# Patient Record
Sex: Female | Born: 1935 | Race: White | Hispanic: No | Smoking: Former smoker
Health system: Southern US, Community
[De-identification: ages and names within clinical notes are randomized; demographics above are authoritative.]

## PROBLEM LIST (undated history)

## (undated) DIAGNOSIS — I10 Essential (primary) hypertension: Secondary | ICD-10-CM

## (undated) DIAGNOSIS — I350 Nonrheumatic aortic (valve) stenosis: Principal | ICD-10-CM

## (undated) DIAGNOSIS — I639 Cerebral infarction, unspecified: Secondary | ICD-10-CM

## (undated) DIAGNOSIS — E785 Hyperlipidemia, unspecified: Secondary | ICD-10-CM

## (undated) DIAGNOSIS — I429 Cardiomyopathy, unspecified: Secondary | ICD-10-CM

## (undated) DIAGNOSIS — C9110 Chronic lymphocytic leukemia of B-cell type not having achieved remission: Principal | ICD-10-CM

## (undated) DIAGNOSIS — D649 Anemia, unspecified: Secondary | ICD-10-CM

## (undated) DIAGNOSIS — T7840XA Allergy, unspecified, initial encounter: Secondary | ICD-10-CM

## (undated) DIAGNOSIS — Z5189 Encounter for other specified aftercare: Secondary | ICD-10-CM

## (undated) DIAGNOSIS — E611 Iron deficiency: Secondary | ICD-10-CM

## (undated) DIAGNOSIS — M8580 Other specified disorders of bone density and structure, unspecified site: Secondary | ICD-10-CM

## (undated) DIAGNOSIS — L853 Xerosis cutis: Secondary | ICD-10-CM

## (undated) DIAGNOSIS — D72820 Lymphocytosis (symptomatic): Principal | ICD-10-CM

## (undated) DIAGNOSIS — C4490 Unspecified malignant neoplasm of skin, unspecified: Secondary | ICD-10-CM

## (undated) HISTORY — DX: Anemia, unspecified: D64.9

## (undated) HISTORY — DX: Other specified disorders of bone density and structure, unspecified site: M85.80

## (undated) HISTORY — PX: CATARACT EXTRACTION: SUR2

## (undated) HISTORY — DX: Cerebral infarction, unspecified: I63.9

## (undated) HISTORY — DX: Unspecified malignant neoplasm of skin, unspecified: C44.90

## (undated) HISTORY — DX: Iron deficiency: E61.1

## (undated) HISTORY — DX: Lymphocytosis (symptomatic): D72.820

## (undated) HISTORY — DX: Chronic lymphocytic leukemia of B-cell type not having achieved remission: C91.10

## (undated) HISTORY — PX: APPENDECTOMY: SHX54

## (undated) HISTORY — DX: Encounter for other specified aftercare: Z51.89

## (undated) HISTORY — DX: Nonrheumatic aortic (valve) stenosis: I35.0

## (undated) HISTORY — DX: Allergy, unspecified, initial encounter: T78.40XA

## (undated) HISTORY — DX: Xerosis cutis: L85.3

## (undated) HISTORY — DX: Essential (primary) hypertension: I10

## (undated) HISTORY — DX: Hyperlipidemia, unspecified: E78.5

---

## 1974-01-22 HISTORY — PX: BREAST BIOPSY: SHX20

## 2010-11-24 ENCOUNTER — Encounter: Payer: Self-pay | Admitting: Internal Medicine

## 2010-11-24 DIAGNOSIS — Z0000 Encounter for general adult medical examination without abnormal findings: Secondary | ICD-10-CM | POA: Insufficient documentation

## 2010-11-24 DIAGNOSIS — Z0001 Encounter for general adult medical examination with abnormal findings: Secondary | ICD-10-CM | POA: Insufficient documentation

## 2010-12-01 ENCOUNTER — Encounter: Payer: Self-pay | Admitting: Internal Medicine

## 2010-12-01 ENCOUNTER — Other Ambulatory Visit (INDEPENDENT_AMBULATORY_CARE_PROVIDER_SITE_OTHER): Payer: Medicare Other

## 2010-12-01 ENCOUNTER — Ambulatory Visit (INDEPENDENT_AMBULATORY_CARE_PROVIDER_SITE_OTHER): Payer: Medicare Other | Admitting: Internal Medicine

## 2010-12-01 VITALS — BP 140/70 | HR 66 | Temp 97.3°F | Ht 60.0 in | Wt 146.5 lb

## 2010-12-01 DIAGNOSIS — IMO0001 Reserved for inherently not codable concepts without codable children: Secondary | ICD-10-CM

## 2010-12-01 DIAGNOSIS — R5381 Other malaise: Secondary | ICD-10-CM

## 2010-12-01 DIAGNOSIS — M8580 Other specified disorders of bone density and structure, unspecified site: Secondary | ICD-10-CM | POA: Insufficient documentation

## 2010-12-01 DIAGNOSIS — R5383 Other fatigue: Secondary | ICD-10-CM

## 2010-12-01 DIAGNOSIS — K635 Polyp of colon: Secondary | ICD-10-CM | POA: Insufficient documentation

## 2010-12-01 DIAGNOSIS — T7840XA Allergy, unspecified, initial encounter: Secondary | ICD-10-CM | POA: Insufficient documentation

## 2010-12-01 DIAGNOSIS — C9110 Chronic lymphocytic leukemia of B-cell type not having achieved remission: Secondary | ICD-10-CM

## 2010-12-01 DIAGNOSIS — L258 Unspecified contact dermatitis due to other agents: Secondary | ICD-10-CM

## 2010-12-01 DIAGNOSIS — R42 Dizziness and giddiness: Secondary | ICD-10-CM

## 2010-12-01 DIAGNOSIS — I10 Essential (primary) hypertension: Secondary | ICD-10-CM | POA: Insufficient documentation

## 2010-12-01 DIAGNOSIS — E611 Iron deficiency: Secondary | ICD-10-CM

## 2010-12-01 DIAGNOSIS — E785 Hyperlipidemia, unspecified: Secondary | ICD-10-CM

## 2010-12-01 DIAGNOSIS — E119 Type 2 diabetes mellitus without complications: Secondary | ICD-10-CM | POA: Insufficient documentation

## 2010-12-01 DIAGNOSIS — D649 Anemia, unspecified: Secondary | ICD-10-CM | POA: Insufficient documentation

## 2010-12-01 DIAGNOSIS — L853 Xerosis cutis: Secondary | ICD-10-CM

## 2010-12-01 HISTORY — DX: Xerosis cutis: L85.3

## 2010-12-01 HISTORY — DX: Chronic lymphocytic leukemia of B-cell type not having achieved remission: C91.10

## 2010-12-01 HISTORY — DX: Iron deficiency: E61.1

## 2010-12-01 LAB — CBC WITH DIFFERENTIAL/PLATELET
Basophils Absolute: 0 10*3/uL (ref 0.0–0.1)
Hemoglobin: 11.7 g/dL — ABNORMAL LOW (ref 12.0–15.0)
Lymphocytes Relative: 56.2 % — ABNORMAL HIGH (ref 12.0–46.0)
Monocytes Relative: 4.5 % (ref 3.0–12.0)
Neutro Abs: 3.2 10*3/uL (ref 1.4–7.7)
Platelets: 168 10*3/uL (ref 150.0–400.0)
RDW: 14.8 % — ABNORMAL HIGH (ref 11.5–14.6)
WBC: 8.5 10*3/uL (ref 4.5–10.5)

## 2010-12-01 LAB — URINALYSIS, ROUTINE W REFLEX MICROSCOPIC
Bilirubin Urine: NEGATIVE
Hgb urine dipstick: NEGATIVE
Leukocytes, UA: NEGATIVE
Nitrite: NEGATIVE
Urobilinogen, UA: 0.2 (ref 0.0–1.0)

## 2010-12-01 LAB — LIPID PANEL
HDL: 66.4 mg/dL (ref >39.00)
LDL Cholesterol: 96 mg/dL (ref 0–99)
Total CHOL/HDL Ratio: 3
Triglycerides: 57 mg/dL (ref 0.0–149.0)
VLDL: 11.4 mg/dL (ref 0.0–40.0)

## 2010-12-01 LAB — BASIC METABOLIC PANEL
CO2: 27 mEq/L (ref 19–32)
Calcium: 9.5 mg/dL (ref 8.4–10.5)
Creatinine, Ser: 1 mg/dL (ref 0.4–1.2)
GFR: 56.76 mL/min — ABNORMAL LOW (ref >60.00)

## 2010-12-01 LAB — HEPATIC FUNCTION PANEL
Albumin: 4 g/dL (ref 3.5–5.2)
Alkaline Phosphatase: 40 U/L (ref 39–117)
Total Protein: 7 g/dL (ref 6.0–8.3)

## 2010-12-01 LAB — TSH: TSH: 1.73 u[IU]/mL (ref 0.35–5.50)

## 2010-12-01 MED ORDER — HYDROCHLOROTHIAZIDE 25 MG PO TABS: 25.0000 mg | ORAL_TABLET | Freq: Every day | ORAL | Status: DC

## 2010-12-01 MED ORDER — METFORMIN HCL 1000 MG PO TABS: 1000.0000 mg | ORAL_TABLET | Freq: Every day | ORAL | Status: DC

## 2010-12-01 MED ORDER — ATORVASTATIN CALCIUM 20 MG PO TABS: 20.0000 mg | ORAL_TABLET | Freq: Every day | ORAL | Status: DC

## 2010-12-01 MED ORDER — LOSARTAN POTASSIUM 100 MG PO TABS: 100.0000 mg | ORAL_TABLET | Freq: Every day | ORAL | Status: DC

## 2010-12-01 MED ORDER — AMLODIPINE BESYLATE 5 MG PO TABS: 5.0000 mg | ORAL_TABLET | Freq: Every day | ORAL | Status: DC

## 2010-12-01 MED ORDER — PIOGLITAZONE HCL 30 MG PO TABS: 30.0000 mg | ORAL_TABLET | Freq: Every day | ORAL | Status: DC

## 2010-12-01 MED ORDER — DIAZEPAM 5 MG PO TABS: ORAL_TABLET | ORAL | Status: DC

## 2010-12-01 NOTE — Patient Instructions (Addendum)
You will be contacted regarding the referral for: oncology, dermatology Please go to LAB in the Basement for the blood and/or urine tests to be done today Please call the phone number [phone] (the [place]) for results of testing in 2-3 days;  When calling, simply dial the number, and when prompted enter the MRN number above (the Medical Record Number) and the # key, then the message should start. OK to stop the pravachol Please start the lipitor 20 mg per day Continue all other medications as before Your refills were sent to the pharmacy on the computer Please take prilosec 20 mg OTC to see if this helps the nause in the future Please call if you change your mind about seeing GI, as we can refer at any time Please try to "wean" off the valium by cutting back on the valium at night first for about 1 wk, then not taking the pill in the AM after that If the dizziness is persistent or worsening, we should consider referral to [facility] neurology as well Yoor "DNR" will be designated on your medical record Please return in 6 months, or sooner if needed

## 2010-12-01 NOTE — Progress Notes (Signed)
Subjective:    Patient ID: Norma Smith, female    DOB: 06-Mar-1933, 77 y.o.   MRN: [id number]  HPI Here to establish as new pt;  C/o recurrent dizziness with nausea and could not tolerate the meclizine due to sleepiness, better overall with valium and feels relatively well controlled, but nausea seems to persist and now thinks may not be as related as she though previously;  Last episode vomiting approx 1 mo ago, with some BRB, as well as dark material at about the same time, assoc with upper abd discomfort  No dysphagia, other abd pain, radiation, fever, bowel change or other blood.  Pt denies chest pain, increased sob or doe, wheezing, orthopnea, PND, increased LE swelling, palpitations, dizziness or syncope.  Pt denies new neurological symptoms such as new headache, or facial or extremity weakness or numbness   Pt denies polydipsia, polyuria, or low sugar symptoms such as weakness or confusion improved with po intake.  Pt states overall good compliance with meds, trying to follow lower cholesterol, diabetic diet, wt overall stable but little exercise however.    Pravastatin may tend to cause abd pain, may be less with taking only one of the 40 mg pill  - has taken lipitor for many yrs in the past and toerated well.  Pt denies fever, wt loss, night sweats, loss of appetite, or other constitutional symptoms  After d/w pt today, she also requests DNR status as this was the case prior to moving here as well, and understands the implications of no resuscitation efforts for her in the future to include intubation, defib, compressions and epi.  Does have sense of ongoing fatigue, but denies signficant hypersomnolence.  Past Medical History  Diagnosis Date  . Cancer   . Colon polyps   . Iron deficiency 12/01/2010  . Allergy   . Diabetes mellitus   . Hyperlipidemia   . Hypertension   . Anemia   . Osteopenia   . Dry skin dermatitis 12/01/2010    CLL Past Surgical History  Procedure Date  . Breast biopsy  1976  . Cataract extraction     reports that she has been smoking.  She does not have any smokeless tobacco history on file. She reports that she does not drink alcohol or use illicit drugs. family history includes Cancer in her other; Diabetes in her others; and Heart disease in her other. Allergies  Allergen Reactions  . Codeine   . Penicillins    No current outpatient prescriptions on file prior to visit.   Review of Systems Review of Systems  Constitutional: Negative for diaphoresis and unexpected weight change.  HENT: Negative for drooling and tinnitus.   Eyes: Negative for photophobia and visual disturbance.  Respiratory: Negative for choking and stridor.   Gastrointestinal: Negative for vomiting and blood in stool.  Genitourinary: Negative for hematuria and decreased urine volume.  Musculoskeletal: Negative for gait problem.  Skin: Negative for color change and wound.  Neurological: Negative for tremors and numbness.  Psychiatric/Behavioral: Negative for decreased concentration. The patient is not hyperactive.       Objective:   Physical Exam BP 140/70  Pulse 66  Temp(Src) 97.3 F (36.3 C) (Oral)  Ht 5' (1.524 m)  Wt 146 lb 8 oz (66.452 kg)  BMI 28.61 kg/m2  SpO2 97% Physical Exam  VS noted Constitutional: Pt appears well-developed and well-nourished.  HENT: Head: Normocephalic.  Right Ear: External ear normal.  Left Ear: External ear normal.  Eyes: Conjunctivae and EOM are  normal. Pupils are equal, round, and reactive to light.  Neck: Normal range of motion. Neck supple.  Cardiovascular: Normal rate and regular rhythm.   Pulmonary/Chest: Effort normal and breath sounds normal.  Abd:  Soft, NT, non-distended, + BS Neurological: Pt is alert. No cranial nerve deficit.  Skin: Skin is warm. No erythema.  Psychiatric: Pt behavior is normal. Thought content normal.     Assessment & Plan:

## 2010-12-01 NOTE — Assessment & Plan Note (Signed)
Due for "full body" check per pt - will refer to dermatology

## 2010-12-02 ENCOUNTER — Encounter: Payer: Self-pay | Admitting: Internal Medicine

## 2010-12-02 DIAGNOSIS — R42 Dizziness and giddiness: Secondary | ICD-10-CM | POA: Insufficient documentation

## 2010-12-02 NOTE — Assessment & Plan Note (Signed)
stable overall by hx and exam, most recent data reviewed with pt, and pt to continue medical treatment as before, to check labs today

## 2010-12-02 NOTE — Assessment & Plan Note (Signed)
Also for iron check today,  to f/u any worsening symptoms or concerns

## 2010-12-02 NOTE — Assessment & Plan Note (Signed)
For cbc today, refer oncology,  to f/u any worsening symptoms or concerns  Note:  Total face to face time including review of records from [place] ([place]) with pt, history, exam, determination of diagnoses, plan for treatment > 40 min, with total with documentation > 50 min

## 2010-12-02 NOTE — Assessment & Plan Note (Signed)
To start lipitor 20 mg, f/u labs next visit, cont diet

## 2010-12-02 NOTE — Assessment & Plan Note (Signed)
With nausea - for trial weaning off the valium, and consider prilosec for nausea trial

## 2010-12-02 NOTE — Assessment & Plan Note (Signed)
stable overall by hx and exam, most recent data reviewed with pt, and pt to continue medical treatment as before  BP Readings from Last 3 Encounters:  12/01/10 140/70

## 2010-12-02 NOTE — Assessment & Plan Note (Signed)
Etiology unclear, Exam otherwise benign, to check labs as documented, follow with expectant management  

## 2010-12-04 ENCOUNTER — Telehealth: Payer: Self-pay

## 2010-12-04 NOTE — Telephone Encounter (Signed)
I do not know how to do DNR

## 2010-12-07 ENCOUNTER — Telehealth: Payer: Self-pay | Admitting: *Deleted

## 2010-12-07 NOTE — Telephone Encounter (Signed)
Patient confirmed over the phone new date and time of the new patient appointment.

## 2010-12-10 ENCOUNTER — Other Ambulatory Visit: Payer: Self-pay | Admitting: Oncology

## 2010-12-10 DIAGNOSIS — C9110 Chronic lymphocytic leukemia of B-cell type not having achieved remission: Secondary | ICD-10-CM

## 2010-12-11 ENCOUNTER — Other Ambulatory Visit (HOSPITAL_COMMUNITY)
Admission: RE | Admit: 2010-12-11 | Discharge: 2010-12-11 | Disposition: A | Discharge status: Home / Self Care | Payer: Medicare Other | Source: Ambulatory Visit | Attending: Oncology | Admitting: Oncology

## 2010-12-11 ENCOUNTER — Other Ambulatory Visit (HOSPITAL_BASED_OUTPATIENT_CLINIC_OR_DEPARTMENT_OTHER): Payer: Medicare Other | Admitting: Lab

## 2010-12-11 ENCOUNTER — Other Ambulatory Visit: Payer: Self-pay | Admitting: Internal Medicine

## 2010-12-11 ENCOUNTER — Ambulatory Visit: Payer: Medicare Other

## 2010-12-11 ENCOUNTER — Ambulatory Visit (HOSPITAL_BASED_OUTPATIENT_CLINIC_OR_DEPARTMENT_OTHER): Payer: Medicare Other | Admitting: Oncology

## 2010-12-11 ENCOUNTER — Telehealth: Payer: Self-pay | Admitting: Oncology

## 2010-12-11 ENCOUNTER — Other Ambulatory Visit: Payer: Self-pay | Admitting: Oncology

## 2010-12-11 VITALS — BP 165/77 | HR 71 | Temp 98.5°F | Ht 60.0 in | Wt 147.9 lb

## 2010-12-11 DIAGNOSIS — C9110 Chronic lymphocytic leukemia of B-cell type not having achieved remission: Secondary | ICD-10-CM | POA: Insufficient documentation

## 2010-12-11 DIAGNOSIS — D649 Anemia, unspecified: Secondary | ICD-10-CM

## 2010-12-11 DIAGNOSIS — R011 Cardiac murmur, unspecified: Secondary | ICD-10-CM

## 2010-12-11 DIAGNOSIS — Z1231 Encounter for screening mammogram for malignant neoplasm of breast: Secondary | ICD-10-CM

## 2010-12-11 LAB — COMPREHENSIVE METABOLIC PANEL
ALT: 8 U/L (ref 0–35)
AST: 21 U/L (ref 0–37)
Alkaline Phosphatase: 43 U/L (ref 39–117)
Creatinine, Ser: 1.13 mg/dL — ABNORMAL HIGH (ref 0.50–1.10)
Total Bilirubin: 0.4 mg/dL (ref 0.3–1.2)

## 2010-12-11 LAB — CBC & DIFF AND RETIC
BASO%: 0.6 % (ref 0.0–2.0)
Immature Retic Fract: 4.1 % (ref 1.60–10.00)
LYMPH%: 51.5 % — ABNORMAL HIGH (ref 14.0–49.7)
MCHC: 33.7 g/dL (ref 31.5–36.0)
MONO#: 0.4 10*3/uL (ref 0.1–0.9)
RBC: 3.62 10*6/uL — ABNORMAL LOW (ref 3.70–5.45)
Retic %: 1.48 % (ref 0.70–2.10)
WBC: 7.9 10*3/uL (ref 3.9–10.3)
lymph#: 4.1 10*3/uL — ABNORMAL HIGH (ref 0.9–3.3)

## 2010-12-11 LAB — MORPHOLOGY: PLT EST: ADEQUATE

## 2010-12-11 LAB — LACTATE DEHYDROGENASE: LDH: 217 U/L (ref 94–250)

## 2010-12-11 LAB — CHCC SMEAR

## 2010-12-11 NOTE — Progress Notes (Signed)
  Subjective:    Patient ID: Norma Smith, female    DOB: [birth year], 77 y.o.   MRN: [id number]  HPI   Received note per Dr Truddie Coco, pt with loud systolic murmur, previously undiagnoses;  Will ask for echo   Review of Systems     Objective:   Physical Exam        Assessment & Plan:

## 2010-12-11 NOTE — Telephone Encounter (Signed)
Gv pt appt for [id number].  scheduled mammogram for 01/04/2011 @ 12:20pm @ [facility]

## 2010-12-11 NOTE — Progress Notes (Signed)
Thanks, will look into the murmur JWJ

## 2010-12-11 NOTE — Progress Notes (Signed)
Referral MD  [phone]    Reason for Referral: CLL  No chief complaint on file. : Patient is originally from [place] and has been followed there by an oncologist. She has moved here and wishes to establish care. She has never been treated for CLL in the past. She also has a diagnosis of anemia and was hospitilized in the past with a Hg of 4 , requiring transfusion. She has no systemic symptoms.    Past Medical History  Diagnosis Date  . Cancer   . Colon polyps   . Iron deficiency 12/01/2010  . Allergy   . Diabetes mellitus   . Hyperlipidemia   . Hypertension   . Anemia   . Osteopenia   . Dry skin dermatitis 12/01/2010  . CLL (chronic lymphoblastic leukemia) 12/01/2010  :  Past Surgical History  Procedure Date  . Breast biopsy 1976  . Cataract extraction   :  Current outpatient prescriptions:acetaminophen (TYLENOL) 325 MG tablet, Take 650 mg by mouth as needed.  , Disp: , Rfl: ;  amLODipine (NORVASC) 5 MG tablet, Take 1 tablet (5 mg total) by mouth daily., Disp: 90 tablet, Rfl: 3;  aspirin 81 MG tablet, Take 81 mg by mouth daily.  , Disp: , Rfl: ;  atorvastatin (LIPITOR) 20 MG tablet, Take 1 tablet (20 mg total) by mouth daily., Disp: 90 tablet, Rfl: 3 Calcium Carbonate-Vitamin D (CALCIUM-D) 600-400 MG-UNIT TABS, Take by mouth 2 (two) times daily.  , Disp: , Rfl: ;  diazepam (VALIUM) 5 MG tablet, 1/2 to 1 tablet two times daily as needed, Disp: 60 tablet, Rfl: 1;  Ferrous Sulfate (IRON) 325 (65 FE) MG TABS, Take by mouth 2 (two) times daily.  , Disp: , Rfl: ;  hydrochlorothiazide (HYDRODIURIL) 25 MG tablet, Take 1 tablet (25 mg total) by mouth daily., Disp: 90 tablet, Rfl: 3 ibuprofen (ADVIL,MOTRIN) 100 MG tablet, Take 100 mg by mouth as needed.  , Disp: , Rfl: ;  losartan (COZAAR) 100 MG tablet, Take 1 tablet (100 mg total) by mouth daily., Disp: 90 tablet, Rfl: 3;  metFORMIN (GLUCOPHAGE) 1000 MG tablet, Take 1 tablet (1,000 mg total) by mouth daily., Disp: 90 tablet, Rfl: 3;   Multiple Vitamins-Minerals (CENTRUM SILVER PO), Take by mouth daily.  , Disp: , Rfl:  pioglitazone (ACTOS) 30 MG tablet, Take 1 tablet (30 mg total) by mouth daily., Disp: 90 tablet, Rfl: 3:    :  Allergies  Allergen Reactions  . Codeine   . Penicillins   :  Family History  Problem Relation Age of Onset  . Diabetes Other   . Cancer Other     breast cancer  . Heart disease Other   . Diabetes Other   :  History   Social History  . Marital Status: Divorced    Spouse Name: N/A    Number of Children: N/A  . Years of Education: 16   Occupational History  . Retired    Social History Main Topics  . Smoking status: Current Everyday Smoker  . Smokeless tobacco: Not on file   Comment: Stopped smoking 5 yrs. ago.  . Alcohol Use: No  . Drug Use: No  . Sexually Active: Not on file   Other Topics Concern  . Not on file   Social History Narrative  . No narrative on file  :  Pertinent items are noted in HPI.  Exam:   General appearance: alert, cooperative and appears stated age HEENT:  Sclerae anicteric, conjunctivae pink.  Oropharynx clear.  No mucositis or candidiasis.  Nodes:  No cervical, supraclavicular, or axillary lymphadenopathy palpated.  Breast Exam:  Right breast is benign.  No masses, discharge, skin change, or nipple inversion.  Left breast is benign.  No masses, discharge, skin change, or nipple inversion..  Lungs:  Clear to auscultation bilaterally.  No crackles, rhonchi, or wheezes.  Heart:  Regular rate and rhythm., loud grade 3/6, systolic murmur.  Abdomen:  Soft, nontender.  Positive bowel sounds.  No organomegaly or masses palpated.  Musculoskeletal:  No focal spinal tenderness to palpation.  Extremities:  Benign.  No peripheral edema or cyanosis.  Skin:  Few small skin cancers.  Neuro:  Nonfocal.   Flo Shanks 12/11/10 0804  WBC 7.9  HGB 11.5*  HCT 34.1*  PLT 142*   No results found for this basename:  NA:2,K:2,CL:2,CO2:2,GLUCOSE:2,BUN:2,CREATININE:2,CALCIUM:2 in the last 72 hours  Blood smear review: lymphocytosis  Pathology:flow pending  No results found.  Assessment and Plan: Stage 0 CLL, no previous therapy. No previous mammogram x 10 yrs; I have scheduled  One . I would recommned w/u for her murmur which is fairly loud and may represent mitral insufficiency. I will see ms Kanhai back in 6 months. Of note she sees the dermatologist next month for formal excision of the skin lesions on her head.

## 2010-12-13 ENCOUNTER — Other Ambulatory Visit: Payer: Self-pay | Admitting: Internal Medicine

## 2010-12-13 DIAGNOSIS — R011 Cardiac murmur, unspecified: Secondary | ICD-10-CM

## 2010-12-21 ENCOUNTER — Other Ambulatory Visit (HOSPITAL_COMMUNITY): Payer: Medicare Other

## 2011-01-04 ENCOUNTER — Ambulatory Visit: Payer: Medicare Other

## 2011-05-31 ENCOUNTER — Ambulatory Visit: Payer: Medicare Other | Admitting: Internal Medicine

## 2011-06-08 ENCOUNTER — Other Ambulatory Visit (INDEPENDENT_AMBULATORY_CARE_PROVIDER_SITE_OTHER): Payer: Medicare Other

## 2011-06-08 ENCOUNTER — Ambulatory Visit (INDEPENDENT_AMBULATORY_CARE_PROVIDER_SITE_OTHER): Payer: Medicare Other | Admitting: Internal Medicine

## 2011-06-08 ENCOUNTER — Encounter: Payer: Self-pay | Admitting: Internal Medicine

## 2011-06-08 VITALS — BP 158/80 | HR 81 | Temp 98.0°F | Ht 60.0 in | Wt 145.0 lb

## 2011-06-08 DIAGNOSIS — R011 Cardiac murmur, unspecified: Secondary | ICD-10-CM | POA: Diagnosis not present

## 2011-06-08 DIAGNOSIS — I10 Essential (primary) hypertension: Secondary | ICD-10-CM

## 2011-06-08 DIAGNOSIS — E785 Hyperlipidemia, unspecified: Secondary | ICD-10-CM

## 2011-06-08 LAB — URINALYSIS, ROUTINE W REFLEX MICROSCOPIC
Bilirubin Urine: NEGATIVE
Leukocytes, UA: NEGATIVE
Nitrite: NEGATIVE
Total Protein, Urine: NEGATIVE

## 2011-06-08 LAB — BASIC METABOLIC PANEL
BUN: 24 mg/dL — ABNORMAL HIGH (ref 6–23)
CO2: 28 mEq/L (ref 19–32)
Chloride: 108 mEq/L (ref 96–112)
Creatinine, Ser: 1.1 mg/dL (ref 0.4–1.2)
Glucose, Bld: 99 mg/dL (ref 70–99)
Potassium: 4.9 mEq/L (ref 3.5–5.1)

## 2011-06-08 LAB — CBC WITH DIFFERENTIAL/PLATELET
Basophils Relative: 0.4 % (ref 0.0–3.0)
Eosinophils Absolute: 0.1 10*3/uL (ref 0.0–0.7)
Eosinophils Relative: 1.1 % (ref 0.0–5.0)
Lymphocytes Relative: 50.9 % — ABNORMAL HIGH (ref 12.0–46.0)
Monocytes Absolute: 0.4 10*3/uL (ref 0.1–1.0)
Neutrophils Relative %: 43.2 % (ref 43.0–77.0)
Platelets: 163 10*3/uL (ref 150.0–400.0)
RBC: 3.59 Mil/uL — ABNORMAL LOW (ref 3.87–5.11)
WBC: 10.1 10*3/uL (ref 4.5–10.5)

## 2011-06-08 LAB — LIPID PANEL
Cholesterol: 166 mg/dL (ref 0–200)
Triglycerides: 50 mg/dL (ref 0.0–149.0)

## 2011-06-08 LAB — HEPATIC FUNCTION PANEL
ALT: 15 U/L (ref 0–35)
AST: 21 U/L (ref 0–37)
Total Protein: 7 g/dL (ref 6.0–8.3)

## 2011-06-08 LAB — TSH: TSH: 2.3 u[IU]/mL (ref 0.35–5.50)

## 2011-06-08 LAB — HEMOGLOBIN A1C: Hgb A1c MFr Bld: 5.7 % (ref 4.6–6.5)

## 2011-06-08 NOTE — Patient Instructions (Addendum)
Continue all other medications as before You will be contacted regarding the referral for: echocardiogram Please go to LAB in the Basement for the blood and/or urine tests to be done today You will be contacted by phone if any changes need to be made immediately.  Otherwise, you will receive a letter about your results with an explanation. Please return in 6 months, or sooner if needed

## 2011-06-09 NOTE — Assessment & Plan Note (Signed)
stable overall by hx and exam, most recent data reviewed with pt, and pt to continue medical treatment as before Lab Results  Component Value Date   HGBA1C 5.7 06/08/2011

## 2011-06-09 NOTE — Assessment & Plan Note (Signed)
stable overall by hx and exam, most recent data reviewed with pt, and pt to continue medical treatment as before Lab Results  Component Value Date   LDLCALC 87 06/08/2011

## 2011-06-09 NOTE — Assessment & Plan Note (Signed)
uncontrolled overall by hx and exam, most recent data reviewed with pt, and pt to try change of ARB from losartan to irbesatan for better efficacy BP Readings from Last 3 Encounters:  06/08/11 158/80  12/11/10 165/77  12/01/10 140/70

## 2011-06-09 NOTE — Assessment & Plan Note (Signed)
For echo, cont same meds for now, consider card referral

## 2011-06-09 NOTE — Progress Notes (Signed)
Subjective:    Patient ID: Norma Smith, female    DOB: Apr 14, 1933, 78 y.o.   MRN: [id number]  HPI  Here to f/u; overall doing ok,  Pt denies chest pain, increased sob or doe, wheezing, orthopnea, PND, increased LE swelling, palpitations, dizziness or syncope.  Pt denies new neurological symptoms such as new headache, or facial or extremity weakness or numbness   Pt denies polydipsia, polyuria, or low sugar symptoms such as weakness or confusion improved with po intake.  Pt states overall good compliance with meds, trying to follow lower cholesterol, diabetic diet, wt overall stable but little exercise however.  Does still have signficant heart murmur, and having the echo done after last visit got lost in the shuffle of other obligations at the time and not sure she wants "a lot done that make me miserable and I'm not afraid to die", but is willing to have done if might mean better quality of life in the coming time of her life after discussion today.   Past Medical History  Diagnosis Date  . Cancer   . Colon polyps   . Iron deficiency 12/01/2010  . Allergy   . Diabetes mellitus   . Hyperlipidemia   . Hypertension   . Anemia   . Osteopenia   . Dry skin dermatitis 12/01/2010  . CLL (chronic lymphoblastic leukemia) 12/01/2010   Past Surgical History  Procedure Date  . Breast biopsy 1976  . Cataract extraction     reports that she has been smoking.  She does not have any smokeless tobacco history on file. She reports that she does not drink alcohol or use illicit drugs. family history includes Cancer in her other; Diabetes in her others; and Heart disease in her other. Allergies  Allergen Reactions  . Codeine   . Penicillins    Current Outpatient Prescriptions on File Prior to Visit  Medication Sig Dispense Refill  . acetaminophen (TYLENOL) 325 MG tablet Take 650 mg by mouth as needed.        Marland Kitchen amLODipine (NORVASC) 5 MG tablet Take 1 tablet (5 mg total) by mouth daily.  90 tablet  3  .  aspirin 81 MG tablet Take 81 mg by mouth daily.        Marland Kitchen atorvastatin (LIPITOR) 20 MG tablet Take 1 tablet (20 mg total) by mouth daily.  90 tablet  3  . Calcium Carbonate-Vitamin D (CALCIUM-D) 600-400 MG-UNIT TABS Take by mouth 2 (two) times daily.        . diazepam (VALIUM) 5 MG tablet 1/2 to 1 tablet two times daily as needed  60 tablet  1  . Ferrous Sulfate (IRON) 325 (65 FE) MG TABS Take by mouth 2 (two) times daily.        . hydrochlorothiazide (HYDRODIURIL) 25 MG tablet Take 1 tablet (25 mg total) by mouth daily.  90 tablet  3  . ibuprofen (ADVIL,MOTRIN) 100 MG tablet Take 100 mg by mouth as needed.        Marland Kitchen losartan (COZAAR) 100 MG tablet Take 1 tablet (100 mg total) by mouth daily.  90 tablet  3  . metFORMIN (GLUCOPHAGE) 1000 MG tablet Take 1 tablet (1,000 mg total) by mouth daily.  90 tablet  3  . Multiple Vitamins-Minerals (CENTRUM SILVER PO) Take by mouth daily.        . pioglitazone (ACTOS) 30 MG tablet Take 1 tablet (30 mg total) by mouth daily.  90 tablet  3   Review of Systems  Review of Systems  Constitutional: Negative for diaphoresis and unexpected weight change.  HENT: Negative for drooling and tinnitus.   Eyes: Negative for photophobia and visual disturbance.  Respiratory: Negative for choking and stridor.   Gastrointestinal: Negative for vomiting and blood in stool.  Genitourinary: Negative for hematuria and decreased urine volume.  Musculoskeletal: Negative for gait problem.  Skin: Negative for color change and wound.  Neurological: Negative for tremors and numbness.  Psychiatric/Behavioral: Negative for decreased concentration. The patient is not hyperactive.       Objective:   Physical Exam BP 158/80  Pulse 81  Temp(Src) 98 F (36.7 C) (Oral)  Ht 5' (1.524 m)  Wt 145 lb (65.772 kg)  BMI 28.32 kg/m2  SpO2 97% Physical Exam  VS noted Constitutional: Pt appears well-developed and well-nourished.  HENT: Head: Normocephalic.  Right Ear: External ear normal.    Left Ear: External ear normal.  Eyes: Conjunctivae and EOM are normal. Pupils are equal, round, and reactive to light.  Neck: Normal range of motion. Neck supple.  Cardiovascular: Normal rate and regular rhythm.  gr 3/6 LUSB murmur Pulmonary/Chest: Effort normal and breath sounds normal.  Neurological: Pt is alert. Not confused Skin: Skin is warm. No erythema.  Psychiatric: Pt behavior is normal. Thought content normal. 1+ nervous, not depressed affect    Assessment & Plan:

## 2011-06-14 ENCOUNTER — Ambulatory Visit: Payer: Medicare Other | Admitting: Oncology

## 2011-06-14 ENCOUNTER — Other Ambulatory Visit: Payer: Medicare Other | Admitting: Lab

## 2011-06-14 ENCOUNTER — Other Ambulatory Visit (HOSPITAL_COMMUNITY): Payer: Self-pay | Admitting: Radiology

## 2011-06-14 DIAGNOSIS — R011 Cardiac murmur, unspecified: Secondary | ICD-10-CM

## 2011-06-15 ENCOUNTER — Other Ambulatory Visit: Payer: Self-pay

## 2011-06-15 ENCOUNTER — Ambulatory Visit (HOSPITAL_COMMUNITY): Payer: Medicare Other | Attending: Cardiology

## 2011-06-15 DIAGNOSIS — E119 Type 2 diabetes mellitus without complications: Secondary | ICD-10-CM | POA: Diagnosis not present

## 2011-06-15 DIAGNOSIS — E785 Hyperlipidemia, unspecified: Secondary | ICD-10-CM | POA: Diagnosis not present

## 2011-06-15 DIAGNOSIS — I319 Disease of pericardium, unspecified: Secondary | ICD-10-CM | POA: Insufficient documentation

## 2011-06-15 DIAGNOSIS — I080 Rheumatic disorders of both mitral and aortic valves: Secondary | ICD-10-CM | POA: Insufficient documentation

## 2011-06-15 DIAGNOSIS — I079 Rheumatic tricuspid valve disease, unspecified: Secondary | ICD-10-CM | POA: Diagnosis not present

## 2011-06-15 DIAGNOSIS — I10 Essential (primary) hypertension: Secondary | ICD-10-CM | POA: Insufficient documentation

## 2011-06-15 DIAGNOSIS — R011 Cardiac murmur, unspecified: Secondary | ICD-10-CM | POA: Diagnosis not present

## 2011-06-16 ENCOUNTER — Encounter: Payer: Self-pay | Admitting: Internal Medicine

## 2011-06-16 ENCOUNTER — Other Ambulatory Visit: Payer: Self-pay | Admitting: Internal Medicine

## 2011-06-16 DIAGNOSIS — I350 Nonrheumatic aortic (valve) stenosis: Secondary | ICD-10-CM

## 2011-06-16 HISTORY — DX: Nonrheumatic aortic (valve) stenosis: I35.0

## 2011-06-20 ENCOUNTER — Encounter: Payer: Self-pay | Admitting: Cardiology

## 2011-06-20 ENCOUNTER — Ambulatory Visit (INDEPENDENT_AMBULATORY_CARE_PROVIDER_SITE_OTHER): Payer: Medicare Other | Admitting: Cardiology

## 2011-06-20 VITALS — BP 150/68 | HR 79 | Ht 60.0 in | Wt 148.0 lb

## 2011-06-20 DIAGNOSIS — I421 Obstructive hypertrophic cardiomyopathy: Secondary | ICD-10-CM | POA: Diagnosis not present

## 2011-06-20 DIAGNOSIS — I739 Peripheral vascular disease, unspecified: Secondary | ICD-10-CM | POA: Diagnosis not present

## 2011-06-20 DIAGNOSIS — E785 Hyperlipidemia, unspecified: Secondary | ICD-10-CM

## 2011-06-20 DIAGNOSIS — I350 Nonrheumatic aortic (valve) stenosis: Secondary | ICD-10-CM

## 2011-06-20 DIAGNOSIS — R0989 Other specified symptoms and signs involving the circulatory and respiratory systems: Secondary | ICD-10-CM

## 2011-06-20 DIAGNOSIS — I359 Nonrheumatic aortic valve disorder, unspecified: Secondary | ICD-10-CM

## 2011-06-20 DIAGNOSIS — I10 Essential (primary) hypertension: Secondary | ICD-10-CM

## 2011-06-20 MED ORDER — METOPROLOL SUCCINATE ER 25 MG PO TB24: 25.0000 mg | ORAL_TABLET | Freq: Every day | ORAL | Status: DC

## 2011-06-20 NOTE — Assessment & Plan Note (Signed)
Continue aspirin and statin. Patient not interested in further evaluation.

## 2011-06-20 NOTE — Progress Notes (Signed)
HPI: 78 year old female for evaluation of murmur. Patient had echocardiogram in May of 2013. There was normal LV function. There was moderate left atrial enlargement. There was grade 1 diastolic dysfunction. There was asymmetric septal hypertrophy with an LVOT gradient of 3.5 m/s. There was systolic anterior motion of the mitral valve and findings were felt consistent with hypertrophic obstructive cardiomyopathy. There was also moderate aortic stenosis with a mean gradient of 23 mm of mercury. There was mild mitral regurgitation.There was a small pericardial effusion. Cardiology is therefore asked to evaluate. Patient denies dyspnea on exertion, orthopnea, PND, exertional chest pain or syncope. She has chronic mild intermittent pedal edema. She has bilateral lower extremity claudication after walking 2 blocks.  Current Outpatient Prescriptions  Medication Sig Dispense Refill  . acetaminophen (TYLENOL) 325 MG tablet Take 650 mg by mouth as needed.        Marland Kitchen amLODipine (NORVASC) 5 MG tablet Take 1 tablet (5 mg total) by mouth daily.  90 tablet  3  . aspirin 81 MG tablet Take 81 mg by mouth daily.        Marland Kitchen atorvastatin (LIPITOR) 20 MG tablet Take 1 tablet (20 mg total) by mouth daily.  90 tablet  3  . Calcium Carbonate-Vitamin D (CALCIUM-D) 600-400 MG-UNIT TABS Take by mouth 2 (two) times daily.        . diazepam (VALIUM) 5 MG tablet 1/2 to 1 tablet two times daily as needed  60 tablet  1  . Ferrous Sulfate (IRON) 325 (65 FE) MG TABS Take by mouth 2 (two) times daily.        . hydrochlorothiazide (HYDRODIURIL) 25 MG tablet Take 1 tablet (25 mg total) by mouth daily.  90 tablet  3  . ibuprofen (ADVIL,MOTRIN) 100 MG tablet Take 100 mg by mouth as needed.        Marland Kitchen losartan (COZAAR) 100 MG tablet Take 1 tablet (100 mg total) by mouth daily.  90 tablet  3  . metFORMIN (GLUCOPHAGE) 1000 MG tablet Take 1 tablet (1,000 mg total) by mouth daily.  90 tablet  3  . Multiple Vitamins-Minerals (CENTRUM SILVER PO)  Take by mouth daily.        . pioglitazone (ACTOS) 30 MG tablet Take 1 tablet (30 mg total) by mouth daily.  90 tablet  3  . metoprolol succinate (TOPROL XL) 25 MG 24 hr tablet Take 1 tablet (25 mg total) by mouth daily.  30 tablet  11    Allergies  Allergen Reactions  . Codeine   . Penicillins     Past Medical History  Diagnosis Date  . Skin cancer   . Iron deficiency 12/01/2010  . Allergy   . Diabetes mellitus   . Hyperlipidemia   . Hypertension   . Anemia   . Osteopenia   . Dry skin dermatitis 12/01/2010  . CLL (chronic lymphoblastic leukemia) 12/01/2010  . Aortic stenosis, moderate 06/16/2011    Past Surgical History  Procedure Date  . Breast biopsy 1976  . Cataract extraction     History   Social History  . Marital Status: Divorced    Spouse Name: N/A    Number of Children: 3  . Years of Education: 16   Occupational History  . Retired    Social History Main Topics  . Smoking status: Former Research scientist (life sciences)  . Smokeless tobacco: Not on file   Comment: Stopped smoking 5 yrs. ago.  . Alcohol Use: Yes     Occasional  . Drug  Use: No  . Sexually Active: Not on file   Other Topics Concern  . Not on file   Social History Narrative  . No narrative on file    Family History  Problem Relation Age of Onset  . Diabetes Other   . Cancer Other     breast cancer  . Heart disease Other   . Diabetes Other     ROS:  no fevers or chills, productive cough, hemoptysis, dysphasia, odynophagia, melena, hematochezia, dysuria, hematuria, rash, seizure activity, orthopnea, PND. Remaining systems are negative.  Physical Exam:   Blood pressure 150/68, pulse 79, height 5' (1.524 m), weight 67.132 kg (148 lb).  General:  Well developed/well nourished in NAD Skin warm/dry Patient not depressed No peripheral clubbing Back-normal HEENT-normal/normal eyelids Neck supple/normal carotid upstroke bilaterally; bilateral bruits; no JVD; no thyromegaly chest - CTA/ normal expansion CV  - RRR/normal S1 and S2; no rubs or gallops;  PMI nondisplaced, 3/6 systolic murmur left sternal border that increases with valsalva.  Abdomen -NT/ND, no HSM, no mass, + bowel sounds, positive bruit 2+ femoral pulses, bilateral bruits Ext-no edema, chords, 1+ DP Neuro-grossly nonfocal  ECG Sinus rhythm at a rate of 70. Normal axis. Prior inferior infarct.

## 2011-06-20 NOTE — Patient Instructions (Signed)
Your physician wants you to follow-up in: [place] will receive a reminder letter in the mail two months in advance. If you don't receive a letter, please call our office to schedule the follow-up appointment.    START TOPROL (METOPROLOL SUCC) 25 MG ONCE DAILY

## 2011-06-20 NOTE — Assessment & Plan Note (Signed)
Patient has bilateral carotid, femoral and abdominal bruits. She clearly has vascular disease. I discussed the possibility of abdominal ultrasound, carotid Dopplers and lower extremity Dopplers. I also think she needs to be screened for coronary disease with a functional study at the minimum. However she does not want aggressive testing. Again she understands the risk of sudden death and rupture of abdominal aortic aneurysm is present. She will consider further evaluation and contact as if she is agreeable.

## 2011-06-20 NOTE — Assessment & Plan Note (Signed)
Patient has moderate aortic stenosis on echo as well. She most likely has an abnormal aortic valve because of turbulence created by left ventricular outflow tract gradient from hypertrophic obstructive cardiomyopathy. We will plan followup echocardiograms in the future if she is agreeable.

## 2011-06-20 NOTE — Assessment & Plan Note (Signed)
Blood pressure elevated. Add Toprol 25 mg daily. 

## 2011-06-20 NOTE — Assessment & Plan Note (Signed)
Patient is noted to have hypertrophic obstructive cardiomyopathy on her echocardiogram. She is having no symptoms. I had long discussions with her today concerning aggressiveness of care. It is clear that she does not want further testing at this point. She understands the risk of sudden cardiac death. I have asked her to contact her children as they need to be screened. I will add Toprol 25 mg daily.

## 2011-06-20 NOTE — Assessment & Plan Note (Signed)
Continue statin. 

## 2011-08-22 ENCOUNTER — Other Ambulatory Visit: Payer: Self-pay

## 2011-08-22 MED ORDER — DIAZEPAM 5 MG PO TABS: ORAL_TABLET | ORAL | Status: DC

## 2011-08-22 NOTE — Telephone Encounter (Signed)
Please advise on refill in Dr Gwynn Burly absence, thanks!

## 2011-08-22 NOTE — Telephone Encounter (Signed)
Rx faxed to pharmacy  

## 2011-08-22 NOTE — Telephone Encounter (Signed)
Ok to refill 30d, no refill (per protocol covering for absent PCP) - prescription printed and signed - placed on triage desk

## 2011-10-19 DIAGNOSIS — Z23 Encounter for immunization: Secondary | ICD-10-CM | POA: Diagnosis not present

## 2011-11-07 ENCOUNTER — Other Ambulatory Visit: Payer: Self-pay

## 2011-11-07 MED ORDER — HYDROCHLOROTHIAZIDE 25 MG PO TABS: 25.0000 mg | ORAL_TABLET | Freq: Every day | ORAL | Status: DC

## 2011-11-07 MED ORDER — ATORVASTATIN CALCIUM 20 MG PO TABS: 20.0000 mg | ORAL_TABLET | Freq: Every day | ORAL | Status: DC

## 2011-11-07 MED ORDER — PIOGLITAZONE HCL 30 MG PO TABS: 30.0000 mg | ORAL_TABLET | Freq: Every day | ORAL | Status: DC

## 2011-11-07 MED ORDER — AMLODIPINE BESYLATE 5 MG PO TABS: 5.0000 mg | ORAL_TABLET | Freq: Every day | ORAL | Status: DC

## 2011-12-11 ENCOUNTER — Encounter: Payer: Self-pay | Admitting: Internal Medicine

## 2011-12-11 ENCOUNTER — Ambulatory Visit (INDEPENDENT_AMBULATORY_CARE_PROVIDER_SITE_OTHER): Payer: Medicare Other | Admitting: Internal Medicine

## 2011-12-11 VITALS — BP 152/70 | HR 68 | Temp 97.2°F | Ht 60.0 in | Wt 149.8 lb

## 2011-12-11 DIAGNOSIS — E785 Hyperlipidemia, unspecified: Secondary | ICD-10-CM

## 2011-12-11 DIAGNOSIS — I10 Essential (primary) hypertension: Secondary | ICD-10-CM

## 2011-12-11 MED ORDER — GLUCOSE BLOOD VI STRP: ORAL_STRIP | Status: DC

## 2011-12-11 MED ORDER — IRBESARTAN 300 MG PO TABS: 300.0000 mg | ORAL_TABLET | Freq: Every day | ORAL | Status: DC

## 2011-12-11 MED ORDER — METOPROLOL TARTRATE 25 MG PO TABS: ORAL_TABLET | ORAL | Status: DC

## 2011-12-11 NOTE — Progress Notes (Signed)
Subjective:    Patient ID: Norma Smith, female    DOB: 1934/01/09, 78 y.o.   MRN: [id number]  HPI  Here to f/u; overall doing ok,  Pt denies chest pain, increased sob or doe, wheezing, orthopnea, PND, increased LE swelling, palpitations, dizziness or syncope.  Pt denies new neurological symptoms such as new headache, or facial or extremity weakness or numbness   Pt denies polydipsia, polyuria,.  Pt states overall good compliance with meds, trying to follow lower cholesterol diet, wt overall stable but little exercise however.  Gets Bloated, tight feeling, uncomfortable upper abd discomfort for about 2 mo, seemed to start with onset of starting the metoprolol in the evening.   Pt denies fever, wt loss, night sweats, loss of appetite, or other constitutional symptoms Past Medical History  Diagnosis Date  . Skin cancer   . Iron deficiency 12/01/2010  . Allergy   . Diabetes mellitus   . Hyperlipidemia   . Hypertension   . Anemia   . Osteopenia   . Dry skin dermatitis 12/01/2010  . CLL (chronic lymphoblastic leukemia) 12/01/2010  . Aortic stenosis, moderate 06/16/2011   Past Surgical History  Procedure Date  . Breast biopsy 1976  . Cataract extraction     reports that she has quit smoking. She does not have any smokeless tobacco history on file. She reports that she drinks alcohol. She reports that she does not use illicit drugs. family history includes Cancer in her other; Diabetes in her others; and Heart disease in her other. Allergies  Allergen Reactions  . Codeine   . Penicillins    Current Outpatient Prescriptions on File Prior to Visit  Medication Sig Dispense Refill  . acetaminophen (TYLENOL) 325 MG tablet Take 650 mg by mouth as needed.        Marland Kitchen amLODipine (NORVASC) 5 MG tablet Take 1 tablet (5 mg total) by mouth daily.  90 tablet  2  . aspirin 81 MG tablet Take 81 mg by mouth daily.        Marland Kitchen atorvastatin (LIPITOR) 20 MG tablet Take 1 tablet (20 mg total) by mouth daily.  90  tablet  2  . Calcium Carbonate-Vitamin D (CALCIUM-D) 600-400 MG-UNIT TABS Take by mouth 2 (two) times daily.        . diazepam (VALIUM) 5 MG tablet 1/2 to 1 tablet two times daily as needed  60 tablet  0  . Ferrous Sulfate (IRON) 325 (65 FE) MG TABS Take by mouth 2 (two) times daily.        . hydrochlorothiazide (HYDRODIURIL) 25 MG tablet Take 1 tablet (25 mg total) by mouth daily.  90 tablet  2  . ibuprofen (ADVIL,MOTRIN) 100 MG tablet Take 100 mg by mouth as needed.        . metFORMIN (GLUCOPHAGE) 1000 MG tablet Take 1 tablet (1,000 mg total) by mouth daily.  90 tablet  3  . Multiple Vitamins-Minerals (CENTRUM SILVER PO) Take by mouth daily.        . pioglitazone (ACTOS) 30 MG tablet Take 1 tablet (30 mg total) by mouth daily.  90 tablet  2  . irbesartan (AVAPRO) 300 MG tablet Take 1 tablet (300 mg total) by mouth at bedtime.  90 tablet  3  . metoprolol tartrate (LOPRESSOR) 25 MG tablet 1/2 tab by mouth twice per day  180 tablet  3   Review of Systems  Constitutional: Negative for diaphoresis and unexpected weight change.  HENT: Negative for tinnitus.   Eyes:  Negative for photophobia and visual disturbance.  Respiratory: Negative for choking and stridor.   Gastrointestinal: Negative for vomiting and blood in stool.  Genitourinary: Negative for hematuria and decreased urine volume.  Musculoskeletal: Negative for gait problem.  Skin: Negative for color change and wound.  Neurological: Negative for tremors and numbness.  Psychiatric/Behavioral: Negative for decreased concentration. The patient is not hyperactive.       Objective:   Physical Exam BP 152/70  Pulse 68  Temp 97.2 F (36.2 C) (Oral)  Ht 5' (1.524 m)  Wt 149 lb 12 oz (67.926 kg)  BMI 29.25 kg/m2  SpO2 94% Physical Exam  VS noted Constitutional: Pt appears well-developed and well-nourished.  HENT: Head: Normocephalic.  Right Ear: External ear normal.  Left Ear: External ear normal.  Eyes: Conjunctivae and EOM are  normal. Pupils are equal, round, and reactive to light.  Neck: Normal range of motion. Neck supple.  Cardiovascular: Normal rate and regular rhythm.   Pulmonary/Chest: Effort normal and breath sounds normal.  Abd:  Soft, NT, non-distended, + BS Neurological: Pt is alert. Not confused  Skin: Skin is warm. No erythema.  Psychiatric: Pt behavior is normal. Thought content normal.     Assessment & Plan:

## 2011-12-11 NOTE — Patient Instructions (Addendum)
OK to stop the toprol XL 25 mg Please start the metoprolol 25 mg - 1/2 twice per day OK to stop the losartan Please start the generic for avapro at 300 mg per day Continue all other medications as before Please have the pharmacy call with any other refills you may need. Please remember to sign up for My Chart at your earliest convenience, as this will be important to you in the future with finding out test results. Please return in 6 months, or sooner if needed

## 2011-12-12 ENCOUNTER — Encounter: Payer: Self-pay | Admitting: Internal Medicine

## 2011-12-12 NOTE — Assessment & Plan Note (Signed)
stable overall by hx and exam, most recent data reviewed with pt, and pt to continue medical treatment as before Lab Results  Component Value Date   LDLCALC 87 06/08/2011

## 2011-12-12 NOTE — Assessment & Plan Note (Signed)
stable overall by hx and exam, most recent data reviewed with pt, and pt to continue medical treatment as before Lab Results  Component Value Date   HGBA1C 5.7 06/08/2011

## 2011-12-12 NOTE — Assessment & Plan Note (Signed)
Uncontrolled, for losartan change to avapro 300, and change toprol xl 25 in the evening to metoprolol 25 - 1/2 bid,  to f/u any worsening symptoms or concerns BP Readings from Last 3 Encounters:  12/11/11 152/70  06/20/11 150/68  06/08/11 158/80

## 2011-12-14 ENCOUNTER — Ambulatory Visit (HOSPITAL_BASED_OUTPATIENT_CLINIC_OR_DEPARTMENT_OTHER): Payer: Medicare Other | Admitting: Oncology

## 2011-12-14 ENCOUNTER — Telehealth: Payer: Self-pay | Admitting: *Deleted

## 2011-12-14 ENCOUNTER — Other Ambulatory Visit (HOSPITAL_BASED_OUTPATIENT_CLINIC_OR_DEPARTMENT_OTHER): Payer: Medicare Other | Admitting: Lab

## 2011-12-14 VITALS — BP 158/87 | HR 71 | Temp 97.8°F | Resp 20 | Ht 60.0 in | Wt 155.0 lb

## 2011-12-14 DIAGNOSIS — C9100 Acute lymphoblastic leukemia not having achieved remission: Secondary | ICD-10-CM

## 2011-12-14 DIAGNOSIS — C9110 Chronic lymphocytic leukemia of B-cell type not having achieved remission: Secondary | ICD-10-CM

## 2011-12-14 DIAGNOSIS — D510 Vitamin B12 deficiency anemia due to intrinsic factor deficiency: Secondary | ICD-10-CM

## 2011-12-14 DIAGNOSIS — D649 Anemia, unspecified: Secondary | ICD-10-CM | POA: Diagnosis not present

## 2011-12-14 LAB — MORPHOLOGY
PLT EST: ADEQUATE
RBC Comments: NORMAL

## 2011-12-14 LAB — COMPREHENSIVE METABOLIC PANEL (CC13)
ALT: 12 U/L (ref 0–55)
AST: 20 U/L (ref 5–34)
Alkaline Phosphatase: 49 U/L (ref 40–150)
CO2: 30 mEq/L — ABNORMAL HIGH (ref 22–29)
Sodium: 140 mEq/L (ref 136–145)
Total Bilirubin: 0.43 mg/dL (ref 0.20–1.20)
Total Protein: 6.2 g/dL — ABNORMAL LOW (ref 6.4–8.3)

## 2011-12-14 LAB — CBC WITH DIFFERENTIAL/PLATELET
Basophils Absolute: 0.1 10*3/uL (ref 0.0–0.1)
Eosinophils Absolute: 0.2 10*3/uL (ref 0.0–0.5)
HGB: 10.7 g/dL — ABNORMAL LOW (ref 11.6–15.9)
MCV: 95.4 fL (ref 79.5–101.0)
MONO#: 0.6 10*3/uL (ref 0.1–0.9)
MONO%: 6.3 % (ref 0.0–14.0)
NEUT#: 4.5 10*3/uL (ref 1.5–6.5)
RDW: 14.1 % (ref 11.2–14.5)

## 2011-12-14 NOTE — Progress Notes (Signed)
.    Hematology and Oncology Follow Up Visit  Jetaime Mathieu [id number] 1933/04/21 78 y.o. 12/17/2011 8:27 AM   DIAGNOSIS:   Encounter Diagnoses  Name Primary?  . CLL (chronic lymphocytic leukemia) Yes  . Anemia   . Pernicious anemia      PAST THERAPY:    Interim History:  History of stage 0 CLL on observation  Medications: I have reviewed the patient's current medications.  Allergies:  Allergies  Allergen Reactions  . Codeine   . Penicillins     Past Medical History, Surgical history, Social history, and Family History were reviewed and updated.  Review of Systems: Constitutional:  Negative for fever, chills, night sweats, anorexia, weight loss, pain. Cardiovascular: no chest pain or dyspnea on exertion Respiratory: negative Neurological: negative Dermatological: negative ENT: negative Skin Gastrointestinal: negative Genito-Urinary: negative Hematological and Lymphatic: negative Breast: negative Musculoskeletal: negative Remaining ROS negative.  Physical Exam:  Blood pressure 158/87, pulse 71, temperature 97.8 F (36.6 C), resp. rate 20, height 5' (1.524 m), weight 155 lb (70.308 kg).  ECOG: 0   HEENT:  Sclerae anicteric, conjunctivae pink.  Oropharynx clear.  No mucositis or candidiasis.  Nodes:  No cervical, supraclavicular, or axillary lymphadenopathy palpated.  Breast Exam:  Right breast is benign.  No masses, discharge, skin change, or nipple inversion.  Left breast is benign.  No masses, discharge, skin change, or nipple inversion..  Lungs:  Clear to auscultation bilaterally.  No crackles, rhonchi, or wheezes.  Heart:  Regular rate and rhythm.  Abdomen:  Soft, nontender.  Positive bowel sounds.  No organomegaly or masses palpated.  Musculoskeletal:  No focal spinal tenderness to palpation.  Extremities:  Benign.  No peripheral edema or cyanosis.  Skin:  Benign.  Neuro:  Nonfocal.    Lab Results: Lab Results  Component Value Date   WBC 9.0 12/14/2011     HGB 10.7* 12/14/2011   HCT 31.3* 12/14/2011   MCV 95.4 12/14/2011   PLT 153 12/14/2011     Chemistry      Component Value Date/Time   NA 140 12/14/2011 0851   NA 143 06/08/2011 1132   K 4.2 12/14/2011 0851   K 4.9 06/08/2011 1132   CL 105 12/14/2011 0851   CL 108 06/08/2011 1132   CO2 30* 12/14/2011 0851   CO2 28 06/08/2011 1132   BUN 26.0 12/14/2011 0851   BUN 24* 06/08/2011 1132   CREATININE 1.1 12/14/2011 0851   CREATININE 1.1 06/08/2011 1132      Component Value Date/Time   CALCIUM 9.6 12/14/2011 0851   CALCIUM 9.6 06/08/2011 1132   ALKPHOS 49 12/14/2011 0851   ALKPHOS 44 06/08/2011 1132   AST 20 12/14/2011 0851   AST 21 06/08/2011 1132   ALT 12 12/14/2011 0851   ALT 15 06/08/2011 1132   BILITOT 0.43 12/14/2011 0851   BILITOT 0.7 06/08/2011 1132       Radiological Studies:  No results found.   IMPRESSIONS AND PLAN: A 78 y.o. female with   Stage 0 CLL. Her white count is stable. She is mildly anemic. She's been noted to be anemic in the past and has required transfusions many years ago. Etiology is not clear. I will check iron stores to followup with her.  Spent more than half the time coordinating care, as well as discussion of BMI and its implications.      Ermie Glendenning 11/25/20138:27 AM Cell [phone]

## 2011-12-14 NOTE — Telephone Encounter (Signed)
Gave patient appointment for 05-2012 

## 2011-12-18 ENCOUNTER — Other Ambulatory Visit: Payer: Self-pay

## 2011-12-18 MED ORDER — DIAZEPAM 5 MG PO TABS: ORAL_TABLET | ORAL | Status: DC

## 2011-12-18 MED ORDER — METFORMIN HCL 1000 MG PO TABS: 1000.0000 mg | ORAL_TABLET | Freq: Every day | ORAL | Status: DC

## 2011-12-18 NOTE — Telephone Encounter (Signed)
Done hardcopy to robin  

## 2011-12-18 NOTE — Telephone Encounter (Signed)
Faxed hardcopy to pharmacy. 

## 2012-02-29 ENCOUNTER — Telehealth: Payer: Self-pay | Admitting: Cardiology

## 2012-02-29 NOTE — Telephone Encounter (Signed)
New Problem: ° ° ° °I called the patient and was unable to reach them. I left a message on their voicemail with my name, the reason I called, the name of their physician, and a number to call back to schedule their appointment. ° °

## 2012-04-30 ENCOUNTER — Other Ambulatory Visit: Payer: Self-pay

## 2012-04-30 MED ORDER — DIAZEPAM 5 MG PO TABS: ORAL_TABLET | ORAL | Status: DC

## 2012-04-30 NOTE — Telephone Encounter (Signed)
Done hardcopy to robin  

## 2012-05-01 NOTE — Telephone Encounter (Signed)
Faxed hardcopy to pharmacy. 

## 2012-05-15 ENCOUNTER — Telehealth: Payer: Self-pay | Admitting: *Deleted

## 2012-05-15 NOTE — Telephone Encounter (Signed)
Called and spoke with patient about rescheduling her appt. She states she only comes once a year and will be due in November.  She is also a CLL patient. Forwarded to [employer] and informed patient someone would call her about an appt.

## 2012-05-29 ENCOUNTER — Encounter: Payer: Self-pay | Admitting: Oncology

## 2012-05-29 ENCOUNTER — Telehealth: Payer: Self-pay | Admitting: Oncology

## 2012-05-29 NOTE — Telephone Encounter (Signed)
Former PR pt reassigned to DM. S/w pt today re reassignment and new appt for 11/10. Per email from Jeff/Kesha pt reuested to be scheduled for November and November is ok. Letter/schedule mailed.

## 2012-06-11 ENCOUNTER — Encounter: Payer: Self-pay | Admitting: Internal Medicine

## 2012-06-11 ENCOUNTER — Ambulatory Visit (INDEPENDENT_AMBULATORY_CARE_PROVIDER_SITE_OTHER): Payer: Medicare Other | Admitting: Internal Medicine

## 2012-06-11 ENCOUNTER — Other Ambulatory Visit (INDEPENDENT_AMBULATORY_CARE_PROVIDER_SITE_OTHER): Payer: Medicare Other

## 2012-06-11 VITALS — BP 122/70 | HR 76 | Temp 97.6°F | Ht 60.0 in | Wt 151.2 lb

## 2012-06-11 DIAGNOSIS — G4719 Other hypersomnia: Secondary | ICD-10-CM

## 2012-06-11 DIAGNOSIS — E785 Hyperlipidemia, unspecified: Secondary | ICD-10-CM

## 2012-06-11 DIAGNOSIS — I10 Essential (primary) hypertension: Secondary | ICD-10-CM

## 2012-06-11 DIAGNOSIS — I350 Nonrheumatic aortic (valve) stenosis: Secondary | ICD-10-CM

## 2012-06-11 DIAGNOSIS — IMO0001 Reserved for inherently not codable concepts without codable children: Secondary | ICD-10-CM

## 2012-06-11 DIAGNOSIS — F329 Major depressive disorder, single episode, unspecified: Secondary | ICD-10-CM

## 2012-06-11 DIAGNOSIS — I359 Nonrheumatic aortic valve disorder, unspecified: Secondary | ICD-10-CM | POA: Diagnosis not present

## 2012-06-11 DIAGNOSIS — G4710 Hypersomnia, unspecified: Secondary | ICD-10-CM

## 2012-06-11 DIAGNOSIS — D649 Anemia, unspecified: Secondary | ICD-10-CM | POA: Diagnosis not present

## 2012-06-11 LAB — URINALYSIS, ROUTINE W REFLEX MICROSCOPIC
Bilirubin Urine: NEGATIVE
Ketones, ur: NEGATIVE
Urine Glucose: NEGATIVE
Urobilinogen, UA: 0.2 (ref 0.0–1.0)

## 2012-06-11 LAB — CBC WITH DIFFERENTIAL/PLATELET
Basophils Absolute: 0.1 10*3/uL (ref 0.0–0.1)
Eosinophils Absolute: 0.2 10*3/uL (ref 0.0–0.7)
HCT: 27.1 % — ABNORMAL LOW (ref 36.0–46.0)
Hemoglobin: 9 g/dL — ABNORMAL LOW (ref 12.0–15.0)
Lymphs Abs: 6.1 10*3/uL — ABNORMAL HIGH (ref 0.7–4.0)
MCHC: 33.2 g/dL (ref 30.0–36.0)
Monocytes Absolute: 0.7 10*3/uL (ref 0.1–1.0)
Neutro Abs: 6.5 10*3/uL (ref 1.4–7.7)
Platelets: 203 10*3/uL (ref 150.0–400.0)
RDW: 15.7 % — ABNORMAL HIGH (ref 11.5–14.6)

## 2012-06-11 LAB — BASIC METABOLIC PANEL
CO2: 27 mEq/L (ref 19–32)
Calcium: 9.6 mg/dL (ref 8.4–10.5)
GFR: 42.25 mL/min — ABNORMAL LOW (ref >60.00)
Glucose, Bld: 99 mg/dL (ref 70–99)
Potassium: 4.5 mEq/L (ref 3.5–5.1)
Sodium: 141 mEq/L (ref 135–145)

## 2012-06-11 LAB — LIPID PANEL
HDL: 59.5 mg/dL (ref >39.00)
Total CHOL/HDL Ratio: 2

## 2012-06-11 LAB — HEPATIC FUNCTION PANEL
AST: 23 U/L (ref 0–37)
Alkaline Phosphatase: 41 U/L (ref 39–117)
Bilirubin, Direct: 0.1 mg/dL (ref 0.0–0.3)

## 2012-06-11 LAB — HEMOGLOBIN A1C: Hgb A1c MFr Bld: 4.7 % (ref 4.6–6.5)

## 2012-06-11 LAB — MICROALBUMIN / CREATININE URINE RATIO
Microalb Creat Ratio: 0.7 mg/g (ref 0.0–30.0)
Microalb, Ur: 0.6 mg/dL (ref 0.0–1.9)

## 2012-06-11 LAB — TSH: TSH: 2.69 u[IU]/mL (ref 0.35–5.50)

## 2012-06-11 NOTE — Assessment & Plan Note (Signed)
Declines further tx such as celexa today or counsleing

## 2012-06-11 NOTE — Assessment & Plan Note (Signed)
For f/u labs today, f/u heme as planned

## 2012-06-11 NOTE — Assessment & Plan Note (Signed)
Not clear if her symtpoms are attributable to any worsening, but will ask for f/u echo, and refer back toDr Crenshaw/card

## 2012-06-11 NOTE — Patient Instructions (Signed)
Please continue all other medications as before, and refills have been done if requested. You will be contacted regarding the referral for: echocardiogram, and Dr Crenshaw/cardiology Please go to the LAB in the Basement (turn left off the elevator) for the tests to be done today You will be contacted by phone if any changes need to be made immediately.  Otherwise, you will receive a letter about your results with an explanation Please remember to sign up for My Chart if you have not done so, as this will be important to you in the future with finding out test results, communicating by private email, and scheduling acute appointments online when needed.

## 2012-06-11 NOTE — Assessment & Plan Note (Signed)
stable overall by history and exam, recent data reviewed with pt, and pt to continue medical treatment as before,  to f/u any worsening symptoms or concerns  Lab Results  Component Value Date   HGBA1C 5.7 06/08/2011

## 2012-06-11 NOTE — Assessment & Plan Note (Signed)
stable overall by history and exam, recent data reviewed with pt, and pt to continue medical treatment as before,  to f/u any worsening symptoms or concerns BP Readings from Last 3 Encounters:  06/11/12 122/70  12/14/11 158/87  12/11/11 152/70

## 2012-06-11 NOTE — Assessment & Plan Note (Addendum)
With poor sleep hygeine and efficiency it seems , cant r/o osa, declines furhter eval or ONO for now  Note:  Total time for pt hx, exam, review of record with pt in the room, determination of diagnoses and plan for further eval and tx is > 40 min, with over 50% spent in coordination and counseling of patient

## 2012-06-11 NOTE — Assessment & Plan Note (Signed)
stable overall by history and exam, recent data reviewed with pt, and pt to continue medical treatment as before,  to f/u any worsening symptoms or concerns Lab Results  Component Value Date   LDLCALC 87 06/08/2011

## 2012-06-11 NOTE — Progress Notes (Signed)
Subjective:    Patient ID: Norma Smith, female    DOB: February 20, 1933, 79 y.o.   MRN: [id number]  HPI  Here to f'u, c/o fatigue and lack of energy ongoing for many months, but worse recently in last few weeks, has some broken sleep most night, ratre to get a full nights sleep, most mornings dont feel rested, snores at night, lives alone, no others to say if she stops breathing or similar with sleeping, She wonders if sleeping on her stomach cuases a positional problem.  Also wonders about anemia since the last time with marked fatigue led to hospn and transfusion (approx 3-4 yrs ago).  Did have some signfican period of depression assoc with family fighting and car repairs last month, better now but still at least mild, thinks it would be nice to go to sleep and not wake up, no intention or plan.  Has been sleeping more during the day.   Pt denies fever, wt loss, night sweats, loss of appetite, or other constitutional symptoms, in fact gained a few lbs. Last cbc nov 2013.  Pt denies polydipsia, polyuria, or low sugar symptoms such as weakness or confusion improved with po intake.  Pt states overall good compliance with meds, trying to follow lower cholesterol, diabetic diet.   Last had echo and saw Dr Blair Hailey may 2013, has some vague dizziness and doe but minimizes this, very hesitant to see card again, afraid of any surgury  Past Medical History  Diagnosis Date  . Skin cancer   . Iron deficiency 12/01/2010  . Allergy   . Diabetes mellitus   . Hyperlipidemia   . Hypertension   . Anemia   . Osteopenia   . Dry skin dermatitis 12/01/2010  . CLL (chronic lymphoblastic leukemia) 12/01/2010  . Aortic stenosis, moderate 06/16/2011   Past Surgical History  Procedure Laterality Date  . Breast biopsy  1976  . Cataract extraction      reports that she has quit smoking. She does not have any smokeless tobacco history on file. She reports that  drinks alcohol. She reports that she does not use illicit  drugs. family history includes Cancer in her other; Diabetes in her others; and Heart disease in her other. Allergies  Allergen Reactions  . Codeine   . Penicillins    Current Outpatient Prescriptions on File Prior to Visit  Medication Sig Dispense Refill  . acetaminophen (TYLENOL) 325 MG tablet Take 650 mg by mouth as needed.        Marland Kitchen amLODipine (NORVASC) 5 MG tablet Take 1 tablet (5 mg total) by mouth daily.  90 tablet  2  . aspirin 81 MG tablet Take 81 mg by mouth daily.        Marland Kitchen atorvastatin (LIPITOR) 20 MG tablet Take 1 tablet (20 mg total) by mouth daily.  90 tablet  2  . Calcium Carbonate-Vitamin D (CALCIUM-D) 600-400 MG-UNIT TABS Take by mouth 2 (two) times daily.        . diazepam (VALIUM) 5 MG tablet 1/2 to 1 tablet two times daily as needed  60 tablet  0  . Ferrous Sulfate (IRON) 325 (65 FE) MG TABS Take by mouth 2 (two) times daily.        Marland Kitchen glucose blood (BAYER CONTOUR NEXT TEST) test strip Use as instructed  100 each  12  . hydrochlorothiazide (HYDRODIURIL) 25 MG tablet Take 1 tablet (25 mg total) by mouth daily.  90 tablet  2  . ibuprofen (ADVIL,MOTRIN) 100 MG  tablet Take 100 mg by mouth as needed.        . irbesartan (AVAPRO) 300 MG tablet Take 1 tablet (300 mg total) by mouth at bedtime.  90 tablet  3  . metFORMIN (GLUCOPHAGE) 1000 MG tablet Take 1 tablet (1,000 mg total) by mouth daily.  90 tablet  3  . metoprolol tartrate (LOPRESSOR) 25 MG tablet 1/2 tab by mouth twice per day  180 tablet  3  . Multiple Vitamins-Minerals (CENTRUM SILVER PO) Take by mouth daily.        . pioglitazone (ACTOS) 30 MG tablet Take 1 tablet (30 mg total) by mouth daily.  90 tablet  2   No current facility-administered medications on file prior to visit.   Review of Systems  Constitutional: Negative for unexpected weight change, or unusual diaphoresis  HENT: Negative for tinnitus.   Eyes: Negative for photophobia and visual disturbance.  Respiratory: Negative for choking and stridor.    Gastrointestinal: Negative for vomiting and blood in stool.  Genitourinary: Negative for hematuria and decreased urine volume.  Musculoskeletal: Negative for acute joint swelling Skin: Negative for color change and wound.  Neurological: Negative for tremors and numbness other than noted  Psychiatric/Behavioral: Negative for decreased concentration or  hyperactivity.       Objective:   Physical Exam BP 122/70  Pulse 76  Temp(Src) 97.6 F (36.4 C) (Oral)  Ht 5' (1.524 m)  Wt 151 lb 4 oz (68.607 kg)  BMI 29.54 kg/m2  SpO2 96% VS noted, not il appearing Constitutional: Pt appears well-developed and well-nourished. /mild obese HENT: Head: NCAT.  Right Ear: External ear normal.  Left Ear: External ear normal.  Eyes: Conjunctivae and EOM are normal. Pupils are equal, round, and reactive to light.  Neck: Normal range of motion. Neck supple.  Cardiovascular: Normal rate and regular rhythm.  with gr 3/6 sys mu LUSB Pulmonary/Chest: Effort normal and breath sounds normal.  Abd:  Soft, NT, non-distended, + BS Neurological: Pt is alert. Not confused  Skin: Skin is warm. No erythema.  Psychiatric: Pt behavior is normal. Thought content normal. , mild nervous, mild depressed affect   Assessment & Plan:

## 2012-06-12 ENCOUNTER — Encounter: Payer: Self-pay | Admitting: Internal Medicine

## 2012-06-12 ENCOUNTER — Other Ambulatory Visit: Payer: Self-pay | Admitting: Internal Medicine

## 2012-06-12 DIAGNOSIS — D649 Anemia, unspecified: Secondary | ICD-10-CM

## 2012-06-12 DIAGNOSIS — C9110 Chronic lymphocytic leukemia of B-cell type not having achieved remission: Secondary | ICD-10-CM

## 2012-06-13 ENCOUNTER — Ambulatory Visit: Payer: Medicare Other | Admitting: Nurse Practitioner

## 2012-06-18 ENCOUNTER — Other Ambulatory Visit: Payer: Medicare Other | Admitting: Lab

## 2012-06-18 ENCOUNTER — Ambulatory Visit: Payer: Medicare Other | Admitting: Oncology

## 2012-06-26 ENCOUNTER — Ambulatory Visit (HOSPITAL_COMMUNITY): Payer: Medicare Other | Attending: Internal Medicine

## 2012-06-26 DIAGNOSIS — I10 Essential (primary) hypertension: Secondary | ICD-10-CM | POA: Diagnosis not present

## 2012-06-26 DIAGNOSIS — I421 Obstructive hypertrophic cardiomyopathy: Secondary | ICD-10-CM | POA: Insufficient documentation

## 2012-06-26 DIAGNOSIS — R0609 Other forms of dyspnea: Secondary | ICD-10-CM | POA: Insufficient documentation

## 2012-06-26 DIAGNOSIS — E785 Hyperlipidemia, unspecified: Secondary | ICD-10-CM | POA: Insufficient documentation

## 2012-06-26 DIAGNOSIS — E119 Type 2 diabetes mellitus without complications: Secondary | ICD-10-CM | POA: Diagnosis not present

## 2012-06-26 DIAGNOSIS — R0989 Other specified symptoms and signs involving the circulatory and respiratory systems: Secondary | ICD-10-CM | POA: Insufficient documentation

## 2012-06-26 DIAGNOSIS — R42 Dizziness and giddiness: Secondary | ICD-10-CM | POA: Diagnosis not present

## 2012-06-26 DIAGNOSIS — I359 Nonrheumatic aortic valve disorder, unspecified: Secondary | ICD-10-CM | POA: Insufficient documentation

## 2012-06-26 DIAGNOSIS — I350 Nonrheumatic aortic (valve) stenosis: Secondary | ICD-10-CM

## 2012-06-26 NOTE — Progress Notes (Signed)
Echocardiogram performed.  

## 2012-06-27 ENCOUNTER — Encounter: Payer: Self-pay | Admitting: Internal Medicine

## 2012-07-02 ENCOUNTER — Other Ambulatory Visit: Payer: Self-pay

## 2012-07-02 MED ORDER — PIOGLITAZONE HCL 30 MG PO TABS: 30.0000 mg | ORAL_TABLET | Freq: Every day | ORAL | Status: DC

## 2012-07-03 ENCOUNTER — Other Ambulatory Visit: Payer: Self-pay | Admitting: Internal Medicine

## 2012-08-25 ENCOUNTER — Other Ambulatory Visit: Payer: Self-pay

## 2012-08-25 MED ORDER — DIAZEPAM 5 MG PO TABS: ORAL_TABLET | ORAL | Status: DC

## 2012-08-25 NOTE — Telephone Encounter (Signed)
Faxed hardcopy to [facility]

## 2012-08-25 NOTE — Telephone Encounter (Signed)
Done hardcopy to robin  

## 2012-10-02 DIAGNOSIS — Z23 Encounter for immunization: Secondary | ICD-10-CM | POA: Diagnosis not present

## 2012-10-06 ENCOUNTER — Other Ambulatory Visit: Payer: Self-pay | Admitting: *Deleted

## 2012-10-06 MED ORDER — HYDROCHLOROTHIAZIDE 25 MG PO TABS: 25.0000 mg | ORAL_TABLET | Freq: Every day | ORAL | Status: DC

## 2012-12-01 ENCOUNTER — Ambulatory Visit (HOSPITAL_BASED_OUTPATIENT_CLINIC_OR_DEPARTMENT_OTHER): Payer: Medicare Other | Admitting: Internal Medicine

## 2012-12-01 ENCOUNTER — Other Ambulatory Visit (HOSPITAL_BASED_OUTPATIENT_CLINIC_OR_DEPARTMENT_OTHER): Payer: Medicare Other | Admitting: Lab

## 2012-12-01 ENCOUNTER — Other Ambulatory Visit: Payer: Self-pay | Admitting: Internal Medicine

## 2012-12-01 ENCOUNTER — Encounter (INDEPENDENT_AMBULATORY_CARE_PROVIDER_SITE_OTHER): Payer: Self-pay

## 2012-12-01 VITALS — BP 158/53 | HR 59 | Temp 98.3°F | Resp 18 | Ht 60.0 in | Wt 152.8 lb

## 2012-12-01 DIAGNOSIS — E611 Iron deficiency: Secondary | ICD-10-CM

## 2012-12-01 DIAGNOSIS — IMO0001 Reserved for inherently not codable concepts without codable children: Secondary | ICD-10-CM

## 2012-12-01 DIAGNOSIS — C9110 Chronic lymphocytic leukemia of B-cell type not having achieved remission: Secondary | ICD-10-CM

## 2012-12-01 DIAGNOSIS — D509 Iron deficiency anemia, unspecified: Secondary | ICD-10-CM

## 2012-12-01 LAB — COMPREHENSIVE METABOLIC PANEL (CC13)
Alkaline Phosphatase: 50 U/L (ref 40–150)
Anion Gap: 8 mEq/L (ref 3–11)
Creatinine: 1.1 mg/dL (ref 0.6–1.1)
Glucose: 103 mg/dl (ref 70–140)
Sodium: 142 mEq/L (ref 136–145)
Total Bilirubin: 0.53 mg/dL (ref 0.20–1.20)
Total Protein: 6.9 g/dL (ref 6.4–8.3)

## 2012-12-01 LAB — CBC WITH DIFFERENTIAL/PLATELET
Eosinophils Absolute: 0.1 10*3/uL (ref 0.0–0.5)
HCT: 32.7 % — ABNORMAL LOW (ref 34.8–46.6)
LYMPH%: 42 % (ref 14.0–49.7)
MCHC: 32.8 g/dL (ref 31.5–36.0)
MCV: 96.7 fL (ref 79.5–101.0)
MONO%: 6.2 % (ref 0.0–14.0)
NEUT%: 49.6 % (ref 38.4–76.8)
Platelets: 175 10*3/uL (ref 145–400)
RBC: 3.38 10*6/uL — ABNORMAL LOW (ref 3.70–5.45)

## 2012-12-01 LAB — FERRITIN CHCC: Ferritin: 24 ng/ml (ref 9–269)

## 2012-12-01 LAB — IRON AND TIBC CHCC
%SAT: 36 % (ref 21–57)
Iron: 108 ug/dL (ref 41–142)
TIBC: 299 ug/dL (ref 236–444)
UIBC: 191 ug/dL (ref 120–384)

## 2012-12-01 NOTE — Patient Instructions (Signed)
Chronic Lymphocytic Leukemia Chronic lymphocytic leukemia (CLL) is a type of cancer of the bone marrow and blood cells. Bone marrow is the soft, spongy tissue inside your bone. In CLL, the bone marrow makes too many white blood cells that usually fight infection in the body (lymphocytes). CLL usually gets worse slowly and is the most common type of adult leukemia.  RISK FACTORS No one knows the exact cause of CLL. There is a higher risk of CLL in people who:   Are older than 50 years.  Are white.  Are female.  Have a family history of CLL or other cancers of the lymph system.  Are of Russian Jewish or Eastern European Jewish descent.  Have been exposed to certain chemicals, such as Agent Orange (used in the Vietnam War) or other herbicides or insecticides. SYMPTOMS  At first, there may be no symptoms of chronic lymphocytic leukemia. After a while, some symptoms may occur, such as:   Feeling more tired than usual, even after rest.  Unplanned weight loss.  Heavy sweating at night.  Fevers.  Shortness of breath.  Decreased energy.  Paleness.  Painless, swollen lymph nodes.  A feeling of fullness in the upper left part of the abdomen.  Easy bruising or bleeding.  More frequent infections. DIAGNOSIS  Your health care provider may perform the following exams and tests to diagnose CLL:  Physical exam to check for an enlarged spleen, liver, or lymph nodes.  Blood and bone marrow tests to identify the presence of cancer cells. These may include tests such as complete blood count, flow cytometry, immunophenotyping, and fluorescence in situ hybridization (FISH).  CT scan to look for swelling or abnormalities in your spleen, liver, and lymph nodes. TREATMENT  Treatment options for CLL depend on the stage and the presence of symptoms. There are a number of types of treatment used for this condition, including:  Observation.  Targeted drugs. These are drugs that interfere with  chemicals that leukemia cells need in order to grow and multiply. They identify and attack specific cancer cells without harming normal cells.  Chemotherapy drugs. These medicines kill cells that are multiplying quickly, such as leukemia cells.  Radiation.  Surgery to remove the spleen.  Biological therapy. This treatment boosts the ability of your own immune system to fight the leukemia cells.  Bone marrow or peripheral blood stem cell transplant. This treatment allows the patient to receive very high doses of chemotherapy and/or radiation. These high doses kill the cancer cells but also destroy the bone marrow. After treatment is complete, you are given donor bone marrow or stem cells, which will replace the bone marrow. HOME CARE INSTRUCTIONS   Because you have an increased risk of infection, practice good hand washing and avoid being around people who are ill or being in crowded places.  Because you have an increased risk of bleeding and bruising, avoid contact sports or other rough activities.  Only take over-the-counter or prescription medicines for pain, discomfort, or fever as directed by your health care provider.  Although some of your treatments might affect your appetite, try to eat regular, healthy meals.  If you develop any side effects, such as nausea, diarrhea, rash, white patches in your mouth, a sore throat, difficulty swallowing, or severe fatigue, tell your health care provider. He or she may have recommendations of things you can do to improve symptoms.  Consider learning some ways to cope with the stress of having a chronic illness, such as yoga, meditation,   or participating in a support group. SEEK MEDICAL CARE IF:  You develop chest pains.  You notice pain, swelling or redness anywhere in your legs.  You have pain in your belly (abdomen).  You develop new bruises that are getting bigger.  You have painful or more swollen lymph nodes.  You develop bleeding  from your gums, nose, or in your urine or stools.  You are unable to stop throwing up (vomiting).  You cannot keep liquids down.  You feel lightheaded.  You have a fever or persistent symptoms for more than 2 3 days.  You develop a severe stiff neck or headache. SEEK IMMEDIATE MEDICAL CARE IF:  You have trouble breathing or feel short of breath.  You faint. Document Released: 05/27/2008 Document Revised: 09/10/2012 Document Reviewed: 07/03/2012 [employer] Patient Information 2014 [employer].  

## 2012-12-01 NOTE — Progress Notes (Signed)
Norma Smith OFFICE PROGRESS NOTE  Cathlean Cower, MD [place] [address]  DIAGNOSIS: CLL (chronic lymphocytic leukemia)  Iron deficiency - Plan: CBC with Differential, Comprehensive metabolic panel, Ferritin, Iron and TIBC  Chief Complaint  Patient presents with  . CLL (chronic lymphocytic leukemia)    CURRENT THERAPY: Observation.    INTERVAL HISTORY: Norma Smith 79 y.o. female with a history of CLL is here for a one-year follow-up.  Se was last seen by Dr. Eston Esters on 12/14/2011.  She had a colonoscopy about 2-3 years ago.  She declines mammograms due to her advanced age.  Her last one was about 7-8 years ago.  She reports receiving the flu shot.  She denies any recent hospitalizations or emergency room visits.  She denies recurrent infections.   She is compliant to oral iron supplementation.  Her appetite and weight is stable.   MEDICAL HISTORY: Past Medical History  Diagnosis Date  . Skin cancer   . Iron deficiency 12/01/2010  . Allergy   . Diabetes mellitus   . Hyperlipidemia   . Hypertension   . Anemia   . Osteopenia   . Dry skin dermatitis 12/01/2010  . CLL (chronic lymphoblastic leukemia) 12/01/2010  . Aortic stenosis, moderate 06/16/2011    INTERIM HISTORY: has Preventative health care; Iron deficiency; Allergy; Colon polyps; Type II or unspecified type diabetes mellitus without mention of complication, uncontrolled; Hyperlipidemia; Hypertension; Anemia; Osteopenia; Dry skin dermatitis; CLL (chronic lymphocytic leukemia); Fatigue; Vertigo; Aortic stenosis, moderate; Hypertrophic obstructive cardiomyopathy; Bruit; Claudication; Depression; and Daytime hypersomnolence on her problem list.    ALLERGIES:  is allergic to codeine and penicillins.  MEDICATIONS: has a current medication list which includes the following prescription(s): acetaminophen, amlodipine, aspirin, atorvastatin, calcium-d, diazepam, iron, glucose blood,  hydrochlorothiazide, ibuprofen, irbesartan, metformin, metoprolol tartrate, multiple vitamins-minerals, and pioglitazone.  SURGICAL HISTORY:  Past Surgical History  Procedure Laterality Date  . Breast biopsy  1976  . Cataract extraction      REVIEW OF SYSTEMS:   Constitutional: Denies fevers, chills or abnormal weight loss Eyes: Denies blurriness of vision Ears, nose, mouth, throat, and face: Denies mucositis or sore throat Respiratory: Denies cough, dyspnea or wheezes Cardiovascular: Denies palpitation, chest discomfort or lower extremity swelling Gastrointestinal:  Denies nausea, heartburn or change in bowel habits Skin: Denies abnormal skin rashes Lymphatics: Denies new lymphadenopathy or easy bruising Neurological:Denies numbness, tingling or new weaknesses Behavioral/Psych: Mood is stable, no new changes  All other systems were reviewed with the patient and are negative.  PHYSICAL EXAMINATION: ECOG PERFORMANCE STATUS: 0 - Asymptomatic  Blood pressure 158/53, pulse 59, temperature 98.3 F (36.8 C), temperature source Oral, resp. rate 18, height 5' (1.524 m), weight 152 lb 12.8 oz (69.31 kg).  GENERAL:alert, no distress and comfortable; elderly female who appears her stated age.  SKIN: skin color, texture, turgor are normal, no rashes or significant lesions EYES: normal, Conjunctiva are pink and non-injected, sclera clear OROPHARYNX:no exudate, no erythema and lips, buccal mucosa, and tongue normal  NECK: supple, thyroid normal size, non-tender, without nodularity LYMPH:  no palpable lymphadenopathy in the cervical, axillary or supraclavicular LUNGS: clear to auscultation and percussion with normal breathing effort HEART: regular rate & rhythm and no murmurs and no lower extremity edema ABDOMEN:abdomen soft, non-tender and normal bowel sounds; no organomegaly.  Musculoskeletal:no cyanosis of digits and no clubbing  NEURO: alert & oriented x 3 with fluent speech, no focal  motor/sensory deficits   LABORATORY DATA: Results for  orders placed in visit on 12/01/12 (from the past 48 hour(s))  CBC WITH DIFFERENTIAL     Status: Abnormal   Collection Time    12/01/12  9:49 AM      Result Value Range   WBC 8.4  3.9 - 10.3 10e3/uL   NEUT# 4.1  1.5 - 6.5 10e3/uL   HGB 10.7 (*) 11.6 - 15.9 g/dL   HCT 32.7 (*) 34.8 - 46.6 %   Platelets 175  145 - 400 10e3/uL   MCV 96.7  79.5 - 101.0 fL   MCH 31.7  25.1 - 34.0 pg   MCHC 32.8  31.5 - 36.0 g/dL   RBC 3.38 (*) 3.70 - 5.45 10e6/uL   RDW 14.4  11.2 - 14.5 %   lymph# 3.5 (*) 0.9 - 3.3 10e3/uL   MONO# 0.5  0.1 - 0.9 10e3/uL   Eosinophils Absolute 0.1  0.0 - 0.5 10e3/uL   Basophils Absolute 0.1  0.0 - 0.1 10e3/uL   NEUT% 49.6  38.4 - 76.8 %   LYMPH% 42.0  14.0 - 49.7 %   MONO% 6.2  0.0 - 14.0 %   EOS% 1.5  0.0 - 7.0 %   BASO% 0.7  0.0 - 2.0 %  IRON AND TIBC [facility]     Status: None   Collection Time    12/01/12  9:49 AM      Result Value Range   Iron 108  41 - 142 ug/dL   TIBC 299  236 - 444 ug/dL   UIBC 191  120 - 384 ug/dL   %SAT 36  21 - 57 %  FERRITIN [facility]     Status: None   Collection Time    12/01/12  9:49 AM      Result Value Range   Ferritin 24  9 - 269 ng/ml  COMPREHENSIVE METABOLIC PANEL (0000000)     Status: None   Collection Time    12/01/12  9:49 AM      Result Value Range   Sodium 142  136 - 145 mEq/L   Potassium 4.3  3.5 - 5.1 mEq/L   Chloride 105  98 - 109 mEq/L   CO2 28  22 - 29 mEq/L   Glucose 103  70 - 140 mg/dl   BUN 20.5  7.0 - 26.0 mg/dL   Creatinine 1.1  0.6 - 1.1 mg/dL   Total Bilirubin 0.53  0.20 - 1.20 mg/dL   Alkaline Phosphatase 50  40 - 150 U/L   AST 22  5 - 34 U/L   ALT 11  0 - 55 U/L   Total Protein 6.9  6.4 - 8.3 g/dL   Albumin 3.7  3.5 - 5.0 g/dL   Calcium 10.1  8.4 - 10.4 mg/dL   Anion Gap 8  3 - 11 mEq/L       Labs:  Lab Results  Component Value Date   WBC 8.4 12/01/2012   HGB 10.7* 12/01/2012   HCT 32.7* 12/01/2012   MCV 96.7 12/01/2012   PLT 175 12/01/2012    NEUTROABS 4.1 12/01/2012      Chemistry      Component Value Date/Time   NA 142 12/01/2012 0949   NA 141 06/11/2012 1011   K 4.3 12/01/2012 0949   K 4.5 06/11/2012 1011   CL 107 06/11/2012 1011   CL 105 12/14/2011 0851   CO2 28 12/01/2012 0949   CO2 27 06/11/2012 1011   BUN 20.5 12/01/2012 0949  BUN 20 06/11/2012 1011   CREATININE 1.1 12/01/2012 0949   CREATININE 1.3* 06/11/2012 1011      Component Value Date/Time   CALCIUM 10.1 12/01/2012 0949   CALCIUM 9.6 06/11/2012 1011   ALKPHOS 50 12/01/2012 0949   ALKPHOS 41 06/11/2012 1011   AST 22 12/01/2012 0949   AST 23 06/11/2012 1011   ALT 11 12/01/2012 0949   ALT 13 06/11/2012 1011   BILITOT 0.53 12/01/2012 0949   BILITOT 0.5 06/11/2012 1011     Basic Metabolic Panel:  Recent Labs Lab 12/01/12 0949  NA 142  K 4.3  CO2 28  GLUCOSE 103  BUN 20.5  CREATININE 1.1  CALCIUM 10.1   GFR Estimated Creatinine Clearance: 37.2 ml/min (by C-G formula based on Cr of 1.1). Liver Function Tests:  Recent Labs Lab 12/01/12 0949  AST 22  ALT 11  ALKPHOS 50  BILITOT 0.53  PROT 6.9  ALBUMIN 3.7   CBC:  Recent Labs Lab 12/01/12 0949  WBC 8.4  NEUTROABS 4.1  HGB 10.7*  HCT 32.7*  MCV 96.7  PLT 175   Anemia work up  Recent Labs  12/01/12 0949  FERRITIN 24  TIBC 299  IRON 108   Studies:  No results found.   RADIOGRAPHIC STUDIES: No results found.  ASSESSMENT: Norma Smith 79 y.o. female with a history of CLL (chronic lymphocytic leukemia)  Iron deficiency - Plan: CBC with Differential, Comprehensive metabolic panel, Ferritin, Iron and TIBC   PLAN:  1. CLL -- Patient is clinically doing well.  Her white count remains stable.  We will continue to monitor for symptoms and a CLL handout has been provided.   2. Iron-deficiency anemia. -- Iron studies are stable.  Ferritin is 24.  Counseled to continue iron supplementation.  Etiology likely GI given her age. Last colonoscopy as indicated above.  She denies the  presence of anemia symptoms such as palpitations, acute shortness or breath.   3. Follow-up. -- We will recheck labs in one year.  They will include her CBC, CMP.   All questions were answered. The patient knows to call the clinic with any problems, questions or concerns. We can certainly see the patient much sooner if necessary.  I spent 10 minutes counseling the patient face to face. The total time spent in the appointment was 15 minutes.    Suresh Audi, MD 12/01/2012 1:10 PM

## 2012-12-02 ENCOUNTER — Telehealth: Payer: Self-pay | Admitting: Internal Medicine

## 2012-12-02 NOTE — Telephone Encounter (Signed)
lmonvm for pt re appts for 11/30/13. Schedule mailed.

## 2012-12-12 ENCOUNTER — Telehealth: Payer: Self-pay | Admitting: *Deleted

## 2012-12-12 ENCOUNTER — Ambulatory Visit: Payer: Medicare Other | Admitting: Internal Medicine

## 2012-12-12 NOTE — Telephone Encounter (Signed)
ToShelba Flake Fax: [phone] From: [facility] Date/ Time: 12/12/2012 8:06 AM Taken By: Ardeen Garland, CSR Caller: Ryder: not collected Patient: Norma Smith, Norma Smith DOB: 12-10-1933 Phone: [phone] Reason for Call: See info below Regarding Appointment: Yes Appt Date: 12/12/2012 Appt Time: 9:10:00 AM Provider: Cathlean Cower (Adults only) Reason: Details: Patient to sick to come in. Outcome: Cancelled appointment in [name]

## 2012-12-13 ENCOUNTER — Emergency Department (HOSPITAL_COMMUNITY): Payer: Medicare Other

## 2012-12-13 ENCOUNTER — Encounter (HOSPITAL_COMMUNITY): Payer: Medicare Other | Admitting: Anesthesiology

## 2012-12-13 ENCOUNTER — Emergency Department (HOSPITAL_COMMUNITY): Payer: Medicare Other | Admitting: Anesthesiology

## 2012-12-13 ENCOUNTER — Inpatient Hospital Stay (HOSPITAL_COMMUNITY)
Admission: EM | Admit: 2012-12-13 | Discharge: 2012-12-22 | DRG: 338 | Disposition: A | Discharge status: Skilled Nursing Facility | Payer: Medicare Other | Attending: Surgery | Admitting: Surgery

## 2012-12-13 ENCOUNTER — Encounter (HOSPITAL_COMMUNITY): Admission: EM | Disposition: A | Discharge status: Skilled Nursing Facility | Payer: Self-pay | Source: Home / Self Care

## 2012-12-13 ENCOUNTER — Encounter (HOSPITAL_COMMUNITY): Payer: Self-pay | Admitting: Emergency Medicine

## 2012-12-13 DIAGNOSIS — F19951 Other psychoactive substance use, unspecified with psychoactive substance-induced psychotic disorder with hallucinations: Secondary | ICD-10-CM | POA: Diagnosis not present

## 2012-12-13 DIAGNOSIS — K929 Disease of digestive system, unspecified: Secondary | ICD-10-CM | POA: Diagnosis not present

## 2012-12-13 DIAGNOSIS — I359 Nonrheumatic aortic valve disorder, unspecified: Secondary | ICD-10-CM | POA: Diagnosis present

## 2012-12-13 DIAGNOSIS — I10 Essential (primary) hypertension: Secondary | ICD-10-CM

## 2012-12-13 DIAGNOSIS — Z87891 Personal history of nicotine dependence: Secondary | ICD-10-CM

## 2012-12-13 DIAGNOSIS — Z794 Long term (current) use of insulin: Secondary | ICD-10-CM

## 2012-12-13 DIAGNOSIS — E119 Type 2 diabetes mellitus without complications: Secondary | ICD-10-CM | POA: Diagnosis present

## 2012-12-13 DIAGNOSIS — K352 Acute appendicitis with generalized peritonitis, without abscess: Principal | ICD-10-CM | POA: Diagnosis present

## 2012-12-13 DIAGNOSIS — Z85828 Personal history of other malignant neoplasm of skin: Secondary | ICD-10-CM

## 2012-12-13 DIAGNOSIS — K631 Perforation of intestine (nontraumatic): Secondary | ICD-10-CM | POA: Diagnosis present

## 2012-12-13 DIAGNOSIS — E785 Hyperlipidemia, unspecified: Secondary | ICD-10-CM | POA: Diagnosis present

## 2012-12-13 DIAGNOSIS — K567 Ileus, unspecified: Secondary | ICD-10-CM

## 2012-12-13 DIAGNOSIS — R34 Anuria and oliguria: Secondary | ICD-10-CM | POA: Diagnosis not present

## 2012-12-13 DIAGNOSIS — Z515 Encounter for palliative care: Secondary | ICD-10-CM

## 2012-12-13 DIAGNOSIS — L270 Generalized skin eruption due to drugs and medicaments taken internally: Secondary | ICD-10-CM | POA: Diagnosis not present

## 2012-12-13 DIAGNOSIS — Z66 Do not resuscitate: Secondary | ICD-10-CM | POA: Diagnosis present

## 2012-12-13 DIAGNOSIS — K3533 Acute appendicitis with perforation and localized peritonitis, with abscess: Secondary | ICD-10-CM

## 2012-12-13 DIAGNOSIS — I4949 Other premature depolarization: Secondary | ICD-10-CM | POA: Diagnosis present

## 2012-12-13 DIAGNOSIS — K35209 Acute appendicitis with generalized peritonitis, without abscess, unspecified as to perforation: Principal | ICD-10-CM | POA: Diagnosis present

## 2012-12-13 DIAGNOSIS — R1084 Generalized abdominal pain: Secondary | ICD-10-CM

## 2012-12-13 DIAGNOSIS — I350 Nonrheumatic aortic (valve) stenosis: Secondary | ICD-10-CM

## 2012-12-13 DIAGNOSIS — J90 Pleural effusion, not elsewhere classified: Secondary | ICD-10-CM | POA: Diagnosis not present

## 2012-12-13 DIAGNOSIS — E860 Dehydration: Secondary | ICD-10-CM | POA: Diagnosis present

## 2012-12-13 DIAGNOSIS — Z9849 Cataract extraction status, unspecified eye: Secondary | ICD-10-CM

## 2012-12-13 DIAGNOSIS — R109 Unspecified abdominal pain: Secondary | ICD-10-CM

## 2012-12-13 DIAGNOSIS — R6510 Systemic inflammatory response syndrome (SIRS) of non-infectious origin without acute organ dysfunction: Secondary | ICD-10-CM | POA: Diagnosis not present

## 2012-12-13 DIAGNOSIS — D509 Iron deficiency anemia, unspecified: Secondary | ICD-10-CM | POA: Diagnosis present

## 2012-12-13 DIAGNOSIS — C9110 Chronic lymphocytic leukemia of B-cell type not having achieved remission: Secondary | ICD-10-CM

## 2012-12-13 DIAGNOSIS — K560 Paralytic ileus: Secondary | ICD-10-CM | POA: Diagnosis not present

## 2012-12-13 DIAGNOSIS — N179 Acute kidney failure, unspecified: Secondary | ICD-10-CM

## 2012-12-13 DIAGNOSIS — R69 Illness, unspecified: Secondary | ICD-10-CM

## 2012-12-13 DIAGNOSIS — K3532 Acute appendicitis with perforation and localized peritonitis, without abscess: Secondary | ICD-10-CM

## 2012-12-13 DIAGNOSIS — E8779 Other fluid overload: Secondary | ICD-10-CM | POA: Diagnosis present

## 2012-12-13 DIAGNOSIS — I421 Obstructive hypertrophic cardiomyopathy: Secondary | ICD-10-CM

## 2012-12-13 DIAGNOSIS — R112 Nausea with vomiting, unspecified: Secondary | ICD-10-CM | POA: Diagnosis present

## 2012-12-13 DIAGNOSIS — IMO0001 Reserved for inherently not codable concepts without codable children: Secondary | ICD-10-CM

## 2012-12-13 DIAGNOSIS — D649 Anemia, unspecified: Secondary | ICD-10-CM

## 2012-12-13 DIAGNOSIS — T40605A Adverse effect of unspecified narcotics, initial encounter: Secondary | ICD-10-CM | POA: Diagnosis not present

## 2012-12-13 HISTORY — PX: LAPAROSCOPIC APPENDECTOMY: SHX408

## 2012-12-13 LAB — CBC WITH DIFFERENTIAL/PLATELET
Eosinophils Absolute: 0 10*3/uL (ref 0.0–0.7)
Eosinophils Relative: 0 % (ref 0–5)
Lymphs Abs: 4.5 10*3/uL — ABNORMAL HIGH (ref 0.7–4.0)
MCH: 32.4 pg (ref 26.0–34.0)
MCHC: 33.9 g/dL (ref 30.0–36.0)
MCV: 95.7 fL (ref 78.0–100.0)
Monocytes Absolute: 1.4 10*3/uL — ABNORMAL HIGH (ref 0.1–1.0)
Neutrophils Relative %: 71 % (ref 43–77)
Platelets: 216 10*3/uL (ref 150–400)
RDW: 13.9 % (ref 11.5–15.5)
WBC Morphology: INCREASED

## 2012-12-13 LAB — URINALYSIS, ROUTINE W REFLEX MICROSCOPIC
Glucose, UA: NEGATIVE mg/dL
Ketones, ur: NEGATIVE mg/dL
Nitrite: NEGATIVE
Protein, ur: 100 mg/dL — AB
Urobilinogen, UA: 0.2 mg/dL (ref 0.0–1.0)

## 2012-12-13 LAB — COMPREHENSIVE METABOLIC PANEL
ALT: 10 U/L (ref 0–35)
AST: 22 U/L (ref 0–37)
BUN: 41 mg/dL — ABNORMAL HIGH (ref 6–23)
CO2: 26 mEq/L (ref 19–32)
Calcium: 9.3 mg/dL (ref 8.4–10.5)
Chloride: 97 mEq/L (ref 96–112)
Creatinine, Ser: 1.4 mg/dL — ABNORMAL HIGH (ref 0.50–1.10)
GFR calc Af Amer: 41 mL/min — ABNORMAL LOW (ref >90)
GFR calc non Af Amer: 35 mL/min — ABNORMAL LOW (ref >90)
Total Protein: 6.8 g/dL (ref 6.0–8.3)

## 2012-12-13 LAB — ABO/RH: ABO/RH(D): A NEG

## 2012-12-13 LAB — TYPE AND SCREEN: Antibody Screen: NEGATIVE

## 2012-12-13 LAB — URINE MICROSCOPIC-ADD ON

## 2012-12-13 LAB — LIPASE, BLOOD: Lipase: 12 U/L (ref 11–59)

## 2012-12-13 SURGERY — APPENDECTOMY, LAPAROSCOPIC
Anesthesia: General | Wound class: Dirty or Infected

## 2012-12-13 MED ORDER — LACTATED RINGERS IV SOLN
INTRAVENOUS | Status: DC | PRN
  Administered 2012-12-13 (×2): via INTRAVENOUS

## 2012-12-13 MED ORDER — ONDANSETRON HCL 4 MG/2ML IJ SOLN
INTRAMUSCULAR | Status: AC
  Filled 2012-12-13: qty 2

## 2012-12-13 MED ORDER — IOHEXOL 300 MG/ML  SOLN
50.0000 mL | Freq: Once | INTRAMUSCULAR | Status: AC | PRN
  Administered 2012-12-13: 50 mL via ORAL

## 2012-12-13 MED ORDER — IOHEXOL 300 MG/ML  SOLN
100.0000 mL | Freq: Once | INTRAMUSCULAR | Status: AC | PRN
  Administered 2012-12-13: 100 mL via INTRAVENOUS

## 2012-12-13 MED ORDER — HYDROMORPHONE HCL PF 1 MG/ML IJ SOLN: 0.5000 mg | INTRAMUSCULAR | Status: DC | PRN

## 2012-12-13 MED ORDER — BUPIVACAINE HCL (PF) 0.25 % IJ SOLN
INTRAMUSCULAR | Status: DC | PRN
  Administered 2012-12-13: 30 mL

## 2012-12-13 MED ORDER — GLYCOPYRROLATE 0.2 MG/ML IJ SOLN
INTRAMUSCULAR | Status: AC
  Filled 2012-12-13: qty 3

## 2012-12-13 MED ORDER — LIDOCAINE HCL (CARDIAC) 20 MG/ML IV SOLN
INTRAVENOUS | Status: DC | PRN
  Administered 2012-12-13: 100 mg via INTRAVENOUS

## 2012-12-13 MED ORDER — FENTANYL CITRATE 0.05 MG/ML IJ SOLN
INTRAMUSCULAR | Status: DC | PRN
  Administered 2012-12-13 (×2): 25 ug via INTRAVENOUS

## 2012-12-13 MED ORDER — METOPROLOL TARTRATE 1 MG/ML IV SOLN
INTRAVENOUS | Status: DC | PRN
  Administered 2012-12-13 (×2): 1 mg via INTRAVENOUS

## 2012-12-13 MED ORDER — ROCURONIUM BROMIDE 100 MG/10ML IV SOLN
INTRAVENOUS | Status: DC | PRN
  Administered 2012-12-13: 30 mg via INTRAVENOUS

## 2012-12-13 MED ORDER — ONDANSETRON HCL 4 MG/2ML IJ SOLN
4.0000 mg | Freq: Once | INTRAMUSCULAR | Status: AC
  Administered 2012-12-13: 4 mg via INTRAVENOUS
  Filled 2012-12-13: qty 2

## 2012-12-13 MED ORDER — NEOSTIGMINE METHYLSULFATE 1 MG/ML IJ SOLN
INTRAMUSCULAR | Status: DC | PRN
  Administered 2012-12-13: 4 mg via INTRAVENOUS

## 2012-12-13 MED ORDER — LACTATED RINGERS IV SOLN
INTRAVENOUS | Status: DC | PRN
  Administered 2012-12-13: 22:00:00 via INTRAVENOUS

## 2012-12-13 MED ORDER — LACTATED RINGERS IV SOLN
INTRAVENOUS | Status: DC | PRN
  Administered 2012-12-13 (×4): 1000 mL

## 2012-12-13 MED ORDER — SUCCINYLCHOLINE CHLORIDE 20 MG/ML IJ SOLN
INTRAMUSCULAR | Status: DC | PRN
  Administered 2012-12-13: 100 mg via INTRAVENOUS

## 2012-12-13 MED ORDER — IOHEXOL 300 MG/ML  SOLN: 50.0000 mL | Freq: Once | INTRAMUSCULAR | Status: DC | PRN

## 2012-12-13 MED ORDER — ETOMIDATE 2 MG/ML IV SOLN
INTRAVENOUS | Status: AC
  Filled 2012-12-13: qty 10

## 2012-12-13 MED ORDER — NEOSTIGMINE METHYLSULFATE 1 MG/ML IJ SOLN
INTRAMUSCULAR | Status: AC
  Filled 2012-12-13: qty 10

## 2012-12-13 MED ORDER — MORPHINE SULFATE 4 MG/ML IJ SOLN
4.0000 mg | INTRAMUSCULAR | Status: DC | PRN
  Administered 2012-12-13: 4 mg via INTRAVENOUS
  Filled 2012-12-13: qty 1

## 2012-12-13 MED ORDER — PHENYLEPHRINE 40 MCG/ML (10ML) SYRINGE FOR IV PUSH (FOR BLOOD PRESSURE SUPPORT)
PREFILLED_SYRINGE | INTRAVENOUS | Status: AC
  Filled 2012-12-13: qty 10

## 2012-12-13 MED ORDER — ETOMIDATE 2 MG/ML IV SOLN
INTRAVENOUS | Status: DC | PRN
  Administered 2012-12-13: 14 mg via INTRAVENOUS

## 2012-12-13 MED ORDER — SODIUM CHLORIDE 0.9 % IV SOLN
1.0000 g | Freq: Once | INTRAVENOUS | Status: AC
  Administered 2012-12-13: 22:00:00 via INTRAVENOUS
  Filled 2012-12-13: qty 1

## 2012-12-13 MED ORDER — ONDANSETRON HCL 4 MG/2ML IJ SOLN
INTRAMUSCULAR | Status: DC | PRN
  Administered 2012-12-13: 4 mg via INTRAVENOUS

## 2012-12-13 MED ORDER — METOPROLOL TARTRATE 1 MG/ML IV SOLN
INTRAVENOUS | Status: AC
  Filled 2012-12-13: qty 5

## 2012-12-13 MED ORDER — BUPIVACAINE HCL (PF) 0.25 % IJ SOLN
INTRAMUSCULAR | Status: AC
  Filled 2012-12-13: qty 30

## 2012-12-13 MED ORDER — SUCCINYLCHOLINE CHLORIDE 20 MG/ML IJ SOLN
INTRAMUSCULAR | Status: AC
  Filled 2012-12-13: qty 1

## 2012-12-13 MED ORDER — PROPOFOL 10 MG/ML IV BOLUS
INTRAVENOUS | Status: AC
  Filled 2012-12-13: qty 20

## 2012-12-13 MED ORDER — LIDOCAINE HCL (CARDIAC) 20 MG/ML IV SOLN
INTRAVENOUS | Status: AC
  Filled 2012-12-13: qty 5

## 2012-12-13 MED ORDER — GLYCOPYRROLATE 0.2 MG/ML IJ SOLN
INTRAMUSCULAR | Status: DC | PRN
  Administered 2012-12-13: 0.6 mg via INTRAVENOUS

## 2012-12-13 MED ORDER — FENTANYL CITRATE 0.05 MG/ML IJ SOLN
INTRAMUSCULAR | Status: AC
  Filled 2012-12-13: qty 5

## 2012-12-13 MED ORDER — PHENYLEPHRINE HCL 10 MG/ML IJ SOLN
INTRAMUSCULAR | Status: DC | PRN
  Administered 2012-12-13: 40 ug via INTRAVENOUS
  Administered 2012-12-13: 80 ug via INTRAVENOUS

## 2012-12-13 MED ORDER — PANTOPRAZOLE SODIUM 40 MG IV SOLR
40.0000 mg | Freq: Once | INTRAVENOUS | Status: AC
  Administered 2012-12-13: 40 mg via INTRAVENOUS
  Filled 2012-12-13: qty 40

## 2012-12-13 SURGICAL SUPPLY — 55 items
APPLICATOR COTTON TIP 6IN STRL (MISCELLANEOUS) ×4 IMPLANT
BENZOIN TINCTURE PRP APPL 2/3 (GAUZE/BANDAGES/DRESSINGS) IMPLANT
BLADE EXTENDED COATED 6.5IN (ELECTRODE) IMPLANT
BLADE HEX COATED 2.75 (ELECTRODE) ×2 IMPLANT
CANISTER SUCTION 2500CC (MISCELLANEOUS) ×2 IMPLANT
COVER MAYO STAND STRL (DRAPES) ×2 IMPLANT
CUTTER FLEX LINEAR 45M (STAPLE) ×2 IMPLANT
DECANTER SPIKE VIAL GLASS SM (MISCELLANEOUS) IMPLANT
DRAIN CHANNEL 19F RND (DRAIN) IMPLANT
DRAPE LAPAROSCOPIC ABDOMINAL (DRAPES) ×2 IMPLANT
DRAPE UTILITY XL STRL (DRAPES) ×2 IMPLANT
DRAPE WARM FLUID 44X44 (DRAPE) ×2 IMPLANT
ELECT REM PT RETURN 9FT ADLT (ELECTROSURGICAL) ×2
ELECTRODE REM PT RTRN 9FT ADLT (ELECTROSURGICAL) ×1 IMPLANT
EVACUATOR DRAINAGE 10X20 100CC (DRAIN) IMPLANT
EVACUATOR SILICONE 100CC (DRAIN)
GLOVE BIOGEL PI IND STRL 7.0 (GLOVE) ×1 IMPLANT
GLOVE BIOGEL PI INDICATOR 7.0 (GLOVE) ×1
GLOVE ECLIPSE 8.0 STRL XLNG CF (GLOVE) ×2 IMPLANT
GLOVE INDICATOR 8.0 STRL GRN (GLOVE) ×4 IMPLANT
GOWN PREVENTION PLUS LG XLONG (DISPOSABLE) ×2 IMPLANT
GOWN STRL REIN XL XLG (GOWN DISPOSABLE) ×4 IMPLANT
KIT BASIN OR (CUSTOM PROCEDURE TRAY) ×2 IMPLANT
NS IRRIG 1000ML POUR BTL (IV SOLUTION) ×2 IMPLANT
PACK GENERAL/GYN (CUSTOM PROCEDURE TRAY) ×2 IMPLANT
POUCH SPECIMEN RETRIEVAL 10MM (ENDOMECHANICALS) ×2 IMPLANT
RELOAD STAPLE TA45 3.5 REG BLU (ENDOMECHANICALS) ×6 IMPLANT
SCALPEL HARMONIC ACE (MISCELLANEOUS) IMPLANT
SET IRRIG TUBING LAPAROSCOPIC (IRRIGATION / IRRIGATOR) ×2 IMPLANT
SOLUTION ANTI FOG 6CC (MISCELLANEOUS) ×2 IMPLANT
SPONGE GAUZE 4X4 12PLY (GAUZE/BANDAGES/DRESSINGS) ×2 IMPLANT
SPONGE LAP 18X18 X RAY DECT (DISPOSABLE) ×4 IMPLANT
STAPLER VISISTAT 35W (STAPLE) ×2 IMPLANT
STRIP CLOSURE SKIN 1/2X4 (GAUZE/BANDAGES/DRESSINGS) IMPLANT
SUCTION POOLE TIP (SUCTIONS) ×2 IMPLANT
SUT PDS AB 1 CTX 36 (SUTURE) IMPLANT
SUT SILK 2 0 (SUTURE) ×1
SUT SILK 2 0 SH CR/8 (SUTURE) ×2 IMPLANT
SUT SILK 2-0 18XBRD TIE 12 (SUTURE) ×1 IMPLANT
SUT SILK 3 0 (SUTURE) ×1
SUT SILK 3 0 SH CR/8 (SUTURE) ×2 IMPLANT
SUT SILK 3-0 18XBRD TIE 12 (SUTURE) ×1 IMPLANT
SUT VIC AB 3-0 SH 18 (SUTURE) IMPLANT
SUT VIC AB 4-0 PS2 27 (SUTURE) IMPLANT
SUT VICRYL 2 0 18  UND BR (SUTURE)
SUT VICRYL 2 0 18 UND BR (SUTURE) IMPLANT
TOWEL OR 17X26 10 PK STRL BLUE (TOWEL DISPOSABLE) ×4 IMPLANT
TOWEL OR NON WOVEN STRL DISP B (DISPOSABLE) ×2 IMPLANT
TRAY FOLEY CATH 14FRSI W/METER (CATHETERS) ×2 IMPLANT
TRAY LAP CHOLE (CUSTOM PROCEDURE TRAY) ×2 IMPLANT
TROCAR BLADELESS OPT 5 75 (ENDOMECHANICALS) IMPLANT
TROCAR XCEL BLUNT TIP 100MML (ENDOMECHANICALS) ×2 IMPLANT
TROCAR XCEL NON-BLD 11X100MML (ENDOMECHANICALS) IMPLANT
TROCAR XCEL UNIV SLVE 11M 100M (ENDOMECHANICALS) IMPLANT
TUBING INSUFFLATION 10FT LAP (TUBING) ×2 IMPLANT

## 2012-12-13 NOTE — ED Provider Notes (Signed)
CSN: [id number]     Arrival date & time 12/13/12  1756 History   First MD Initiated Contact with Patient 12/13/12 1756     Chief Complaint  Patient presents with  . Abdominal Pain   ) HPI  Patient presents with abdominal pain starting at 1:30 yesterday morning. Started primarily epigastric. It is now rather diffuse. She has pain with breathing coughing: Finger in the car. Mild nausea. Vomiting yesterday but not today. Normal bowel movement yesterday. No bowel movements today. Constipation has not been a problem for her. No diarrhea. No dark or black or bloody stools. The pain is constant. Not frankly colicky.  She denies urinary symptoms. She denies fevers chills. No chest pain. No frank dyspnea. Further pain in her abdomen is worsened with deep breathing or cough  Past Medical History  Diagnosis Date  . Skin cancer   . Iron deficiency 12/01/2010  . Allergy   . Diabetes mellitus   . Hyperlipidemia   . Hypertension   . Anemia   . Osteopenia   . Dry skin dermatitis 12/01/2010  . CLL (chronic lymphoblastic leukemia) 12/01/2010  . Aortic stenosis, moderate 06/16/2011   Past Surgical History  Procedure Laterality Date  . Breast biopsy  1976  . Cataract extraction     Family History  Problem Relation Age of Onset  . Diabetes Other   . Cancer Other     breast cancer  . Heart disease Other   . Diabetes Other    History  Substance Use Topics  . Smoking status: Former Research scientist (life sciences)  . Smokeless tobacco: Not on file     Comment: Stopped smoking 5 yrs. ago.  . Alcohol Use: Yes     Comment: Occasional   OB History   Grav Para Term Preterm Abortions TAB SAB Ect Mult Living                 Review of Systems  Constitutional: Negative for fever, chills, diaphoresis, appetite change and fatigue.  HENT: Negative for mouth sores, sore throat and trouble swallowing.   Eyes: Negative for visual disturbance.  Respiratory: Negative for cough, chest tightness, shortness of breath and wheezing.     Cardiovascular: Negative for chest pain.  Gastrointestinal: Positive for nausea and abdominal distention. Negative for vomiting, abdominal pain and diarrhea.  Endocrine: Negative for polydipsia, polyphagia and polyuria.  Genitourinary: Negative for dysuria, frequency and hematuria.  Musculoskeletal: Negative for gait problem.  Skin: Negative for color change, pallor and rash.  Neurological: Negative for dizziness, syncope, light-headedness and headaches.  Hematological: Does not bruise/bleed easily.  Psychiatric/Behavioral: Negative for behavioral problems and confusion.    Allergies  Codeine and Penicillins  Home Medications   Current Outpatient Rx  Name  Route  Sig  Dispense  Refill  . acetaminophen (TYLENOL) 325 MG tablet   Oral   Take 650 mg by mouth as needed for mild pain.          Marland Kitchen amLODipine (NORVASC) 5 MG tablet   Oral   Take 5 mg by mouth daily.         Marland Kitchen aspirin 81 MG tablet   Oral   Take 81 mg by mouth daily.           Marland Kitchen atorvastatin (LIPITOR) 20 MG tablet   Oral   Take 20 mg by mouth daily.         . Calcium Carbonate-Vitamin D (CALCIUM-D) 600-400 MG-UNIT TABS   Oral   Take by mouth 2 (  two) times daily.           . diazepam (VALIUM) 5 MG tablet   Oral   Take 2.5 mg by mouth daily.         . Ferrous Sulfate (IRON) 325 (65 FE) MG TABS   Oral   Take by mouth 2 (two) times daily.           . hydrochlorothiazide (HYDRODIURIL) 25 MG tablet   Oral   Take 1 tablet (25 mg total) by mouth daily.   90 tablet   1   . irbesartan (AVAPRO) 300 MG tablet   Oral   Take 1 tablet (300 mg total) by mouth at bedtime.   90 tablet   3   . metFORMIN (GLUCOPHAGE) 1000 MG tablet   Oral   Take 1 tablet (1,000 mg total) by mouth daily.   90 tablet   3   . metoprolol tartrate (LOPRESSOR) 25 MG tablet   Oral   Take 12.5 mg by mouth every morning.         . Multiple Vitamins-Minerals (CENTRUM SILVER PO)   Oral   Take by mouth daily.            . pioglitazone (ACTOS) 30 MG tablet   Oral   Take 1 tablet (30 mg total) by mouth daily.   90 tablet   3    BP 110/68  Pulse 105  Temp(Src) 99.1 F (37.3 C) (Oral)  Resp 16  SpO2 95% Physical Exam  Constitutional: She is oriented to person, place, and time. She appears well-developed and well-nourished. No distress.  HENT:  Head: Normocephalic.  Eyes: Conjunctivae are normal. Pupils are equal, round, and reactive to light. No scleral icterus.  Neck: Normal range of motion. Neck supple. No thyromegaly present.  Cardiovascular: Normal rate, regular rhythm, S1 normal and S2 normal.  Exam reveals no gallop, no distant heart sounds and no friction rub.   Murmur heard.  Systolic murmur is present with a grade of 2/6  Pulmonary/Chest: Effort normal and breath sounds normal. No respiratory distress. She has no decreased breath sounds. She has no wheezes. She has no rhonchi. She has no rales.  Abdominal: Soft. Bowel sounds are normal. She exhibits no distension. There is generalized tenderness. There is rebound and guarding. There is no rigidity.    There is reproducible right upper quadrant, epigastrium and supraumbilical abdomen. Bilateral lower abdominal tenderness  Musculoskeletal: Normal range of motion.  Neurological: She is alert and oriented to person, place, and time.  Skin: Skin is warm and dry. No rash noted.  Psychiatric: She has a normal mood and affect. Her behavior is normal.    ED Course  Procedures (including critical care time) Labs Review Labs Reviewed  CBC WITH DIFFERENTIAL - Abnormal; Notable for the following:    WBC 20.4 (*)    RBC 3.24 (*)    Hemoglobin 10.5 (*)    HCT 31.0 (*)    Neutro Abs 14.5 (*)    Lymphs Abs 4.5 (*)    Monocytes Absolute 1.4 (*)    All other components within normal limits  COMPREHENSIVE METABOLIC PANEL - Abnormal; Notable for the following:    Glucose, Bld 153 (*)    BUN 41 (*)    Creatinine, Ser 1.40 (*)    Albumin 3.3 (*)     GFR calc non Af Amer 35 (*)    GFR calc Af Amer 41 (*)    All other components within  normal limits  URINALYSIS, ROUTINE W REFLEX MICROSCOPIC - Abnormal; Notable for the following:    Color, Urine AMBER (*)    APPearance TURBID (*)    Specific Gravity, Urine 1.031 (*)    Hgb urine dipstick LARGE (*)    Bilirubin Urine MODERATE (*)    Protein, ur 100 (*)    Leukocytes, UA SMALL (*)    All other components within normal limits  URINE MICROSCOPIC-ADD ON - Abnormal; Notable for the following:    Bacteria, UA FEW (*)    All other components within normal limits  URINE CULTURE  LIPASE, BLOOD   Imaging Review Ct Abdomen Pelvis W Contrast  12/13/2012   CLINICAL DATA:  Abdomen pain  EXAM: CT ABDOMEN AND PELVIS WITH CONTRAST  TECHNIQUE: Multidetector CT imaging of the abdomen and pelvis was performed using the standard protocol following bolus administration of intravenous contrast.  CONTRAST:  148mL OMNIPAQUE IOHEXOL 300 MG/ML  SOLN  COMPARISON:  None.  FINDINGS: The appendix is dilated partial air-filled with stranding around the appendix. There is free air and free fluid in the right lower quadrant and free fluid in the pelvis. Inflammation surrounding ileal small bowel loops with bowel wall thickening. There are dilated more proximal small bowel loops consistent with ileus.  The liver, spleen are normal. There is small calcified granuloma within the spleen. Small amount of ascites is noted around the liver and spleen. The gallbladder, pancreas, adrenal glands are normal. There are small cysts in bilateral kidneys, largest in the posterior midpole left kidney measuring 1.2 cm. There is no hydronephrosis bilaterally. There is atherosclerosis of the abdominal aorta without aneurysmal dilatation.  Decompressed bladder limits evaluation. The uterus is normal for the patient's age. Minimal dependent atelectasis of the posterior right lung base. No acute abnormalities identified within the visualized  bones.  IMPRESSION: Findings consistent with perforated bowel in the right lower quadrant with free air and free fluid in the right lower quadrant probably either arising from the appendix or distal ileal small bowel loops. The appendix is dilated and enlarged. There are also inflamed thick wall ileal small bowel loops.  I discussed the results with Dr. Tanna Furry on December 13, 2012 at 8:45 p.m.   Electronically Signed   By: Abelardo Diesel M.D.   On: 12/13/2012 20:49    EKG Interpretation   None       MDM   1. Appendicitis with peritonitis    CT shows free air and fluid around the terminal ileum and cecum dilated appendix. Differential diagnosis includes perforated appendicitis. Discussed case with Dr. Constance Holster hour. He is planning evaluation in the emergency room and admission.    Lebron Quam, MD 12/13/12 2137

## 2012-12-13 NOTE — ED Notes (Signed)
Per EMS. Pt from [facility]. Pt complains of abd pain radiating to back since yesterday at 0130. Has had nausea on and off since then. LBM yesterday

## 2012-12-13 NOTE — Preoperative (Signed)
Beta Blockers   Reason not to administer Beta Blockers:Beta blocker held until after induction of general anesthesia. Metoprolol  given during case,

## 2012-12-13 NOTE — Transfer of Care (Signed)
Immediate Anesthesia Transfer of Care Note  Patient: Norma Smith  Procedure(s) Performed: Procedure(s): APPENDECTOMY LAPAROSCOPIC (N/A)  Patient Location: PACU  Anesthesia Type:General  Level of Consciousness: sedated  Airway & Oxygen Therapy: Patient Spontanous Breathing and Patient connected to face mask oxygen  Post-op Assessment: Report given to PACU RN and Post -op Vital signs reviewed and stable  Post vital signs: Reviewed and stable  Complications: No apparent anesthesia complications

## 2012-12-13 NOTE — ED Notes (Signed)
Pt given chg bath, consent signed

## 2012-12-13 NOTE — Op Note (Signed)
Appendectomy, Lap, Procedure Note  Pre-operative Diagnosis:   Perforated viscous with peritonitis  Post-operative Diagnosis: Acute perforated appendicitis with peritonitis  Procedure: Laparoscopic appendectomy  Surgeon:  Jackolyn Confer, M.D.  Anesthesia:  General   Indications:  This is a 79 year old female with worsening lower abdominal pain. She presented to the emergency department and findings consistent with a perforated viscus with free air and fluid in the right lower quadrant and pelvic area. She is brought to the operating room for the above procedure.   Procedure Details   The patient was brought to the operating room, placed in the supine position and general anesthesia was induced, along with placement of nasogastric tube, SCDs, and a Foley catheter. The abdomen was prepped and draped in a sterile fashion. A small infraumbilical incision was made through the skin, subcutaneous tissue, fascia, and peritoneum entering the peritoneal cavity under direct vision.  A pursestring suture was passed around the fascia with a 0 Vicryl.  The Hasson was introduced into the peritoneal cavity and the tails of the suture were used to hold the Hasson in place.   The pneumoperitoneum was then established to steady pressure of 15 mmHg. The laparoscope was introduced and there was purulent drainage in the right lower quadrant with peritonitis involving the lower abdomen. The small intestine was dilated. The distal small intestine was inflamed.   Additional 5 mm cannulas were then placed in the left lower quadrant of the abdomen and the right upper quadrant region under direct visualization. She was placed in Trendelenburg and left lateral decubitus position. The small intestines were retracted in the cephalad and left lateral direction away from the pelvis and right lower quadrant. The patient was found to have an enlarged and inflamed appendix that was extending into the pelvis and was perforated.   Purulent fluid was present in the RLQ and right pelvic areas. The appendix was carefully mobilized using blunt and hydrodissection. The mesoappendix was was divided with the harmonic scalpel.   The appendix was amputated off the cecum, with a small cuff of cecum, using an endo-GIA stapler.  The appendix was placed in a retrieval bag and removed through the subumbilical port incision.    There was no evidence of bleeding, leakage, or complication after division of the appendix. Copious irrigation was  Performed (4 liters) and irrigant fluid suctioned from the abdomen as much as possible.  A drain was then placed into the abdominal cavity and brought out the left lower quadrant trocar site. It was positioned in the pelvis and right gutter. It was anchored to the skin with 3-0 nylon suture. 4 quadrant inspection demonstrated no evidence of bleeding or organ injury. The peritonitis was confined to the lower abdomen and mostly in the right lower quadrant.  The umbilical trocar was removed and the  port site fascia was closed via the purse string suture under laparoscopic vision. There was no residual palpable fascial defect.  The remaining trocar was removed and all  trocar site skin wounds were closed with 4-0 Monocryl.  The drain was hooked up to closed suction. Sterile dressings were applied to all sites.There were no apparent complications.  Instrument, sponge, and needle counts were correct at the conclusion of the case.   Findings: The appendix was found to be inflamed. There were signs of necrosis.  There was perforation. There was abscess formation.  Estimated Blood Loss:  150 ml         Drains: 19 Blake drain  Specimens: Appendix         Complications:  None; patient tolerated the procedure well.         Disposition: PACU - hemodynamically stable.         Condition: stable

## 2012-12-13 NOTE — Anesthesia Preprocedure Evaluation (Addendum)
Anesthesia Evaluation  Patient identified by MRN, date of birth, ID band Patient awake    Reviewed: Allergy & Precautions, H&P , NPO status , Patient's Chart, lab work & pertinent test results  Airway Mallampati: II TM Distance: >3 FB Neck ROM: Full    Dental  (+) Edentulous Lower and Edentulous Upper   Pulmonary former smoker,  CXR: cardiac enlargement . No acute findings. breath sounds clear to auscultation  Pulmonary exam normal       Cardiovascular hypertension, Pt. on medications and Pt. on home beta blockers + Valvular Problems/Murmurs AS Rhythm:Regular Rate:Normal + Systolic murmurs Moderate aortic stenosis.  ECG: Multiple PVCs, LVH  ECHO: 06-26-12: reviewed. EF normal. Hypertrophic obstructive cardiomyopathy. Dr. Stanford Breed last visit 06-20-11: reviewed.  4/6 Systolic murmur. She says she can climb a flight of stairs.   Neuro/Psych PSYCHIATRIC DISORDERS Depression negative neurological ROS     GI/Hepatic negative GI ROS, Neg liver ROS,   Endo/Other  diabetes, Type 2, Oral Hypoglycemic Agents  Renal/GU negative Renal ROS  negative genitourinary   Musculoskeletal negative musculoskeletal ROS (+)   Abdominal   Peds negative pediatric ROS (+)  Hematology  (+) Blood dyscrasia, anemia ,   Anesthesia Other Findings   Reproductive/Obstetrics negative OB ROS                       Anesthesia Physical Anesthesia Plan  ASA: IV and emergent  Anesthesia Plan: General   Post-op Pain Management:    Induction: Intravenous  Airway Management Planned: Oral ETT  Additional Equipment:   Intra-op Plan:   Post-operative Plan: Extubation in OR  Informed Consent: I have reviewed the patients History and Physical, chart, labs and discussed the procedure including the risks, benefits and alternatives for the proposed anesthesia with the patient or authorized representative who has indicated his/her  understanding and acceptance.   Dental advisory given  Plan Discussed with: CRNA  Anesthesia Plan Comments:        Anesthesia Quick Evaluation

## 2012-12-13 NOTE — H&P (Signed)
Norma Smith is an 79 y.o. female.   Chief Complaint: Severe, persistent lower abdominal pain HPI: She had the onset of diffuse abdominal pain early Friday morning. The pain has persisted and migrated to the lower abdominal area. It has worsened. She presented to the emergency department and was noted to have a significant leukocytosis. She subsequently underwent a CT scan which demonstrated free air and free fluid in the right lower quadrant. There is suspicion of a perforated appendix or perforated distal small bowel. It was for this reason I was asked to see her.  Past Medical History  Diagnosis Date  . Skin cancer   . Iron deficiency 12/01/2010  . Allergy   . Diabetes mellitus   . Hyperlipidemia   . Hypertension   . Anemia   . Osteopenia   . Dry skin dermatitis 12/01/2010  . CLL (chronic lymphoblastic leukemia) 12/01/2010  . Aortic stenosis, moderate 06/16/2011    Past Surgical History  Procedure Laterality Date  . Breast biopsy  1976  . Cataract extraction      Family History  Problem Relation Age of Onset  . Diabetes Other   . Cancer Other     breast cancer  . Heart disease Other   . Diabetes Other    Social History:  reports that she has quit smoking. She does not have any smokeless tobacco history on file. She reports that she drinks alcohol. She reports that she does not use illicit drugs.  Allergies:  Allergies  Allergen Reactions  . Codeine Other (See Comments)    Passed out  . Penicillins Rash     (Not in a hospital admission)  Results for orders placed during the hospital encounter of 12/13/12 (from the past 48 hour(s))  CBC WITH DIFFERENTIAL     Status: Abnormal   Collection Time    12/13/12  6:35 PM      Result Value Range   WBC 20.4 (*) 4.0 - 10.5 K/uL   RBC 3.24 (*) 3.87 - 5.11 MIL/uL   Hemoglobin 10.5 (*) 12.0 - 15.0 g/dL   HCT 31.0 (*) 36.0 - 46.0 %   MCV 95.7  78.0 - 100.0 fL   MCH 32.4  26.0 - 34.0 pg   MCHC 33.9  30.0 - 36.0 g/dL   RDW 13.9   11.5 - 15.5 %   Platelets 216  150 - 400 K/uL   Neutrophils Relative % 71  43 - 77 %   Lymphocytes Relative 22  12 - 46 %   Monocytes Relative 7  3 - 12 %   Eosinophils Relative 0  0 - 5 %   Basophils Relative 0  0 - 1 %   Neutro Abs 14.5 (*) 1.7 - 7.7 K/uL   Lymphs Abs 4.5 (*) 0.7 - 4.0 K/uL   Monocytes Absolute 1.4 (*) 0.1 - 1.0 K/uL   Eosinophils Absolute 0.0  0.0 - 0.7 K/uL   Basophils Absolute 0.0  0.0 - 0.1 K/uL   WBC Morphology INCREASED BANDS (>20% BANDS)     Comment: MILD LEFT SHIFT (1-5% METAS, OCC MYELO, OCC BANDS)  COMPREHENSIVE METABOLIC PANEL     Status: Abnormal   Collection Time    12/13/12  6:35 PM      Result Value Range   Sodium 136  135 - 145 mEq/L   Potassium 3.5  3.5 - 5.1 mEq/L   Chloride 97  96 - 112 mEq/L   CO2 26  19 - 32 mEq/L  Glucose, Bld 153 (*) 70 - 99 mg/dL   BUN 41 (*) 6 - 23 mg/dL   Creatinine, Ser 1.40 (*) 0.50 - 1.10 mg/dL   Calcium 9.3  8.4 - 10.5 mg/dL   Total Protein 6.8  6.0 - 8.3 g/dL   Albumin 3.3 (*) 3.5 - 5.2 g/dL   AST 22  0 - 37 U/L   ALT 10  0 - 35 U/L   Alkaline Phosphatase 45  39 - 117 U/L   Total Bilirubin 0.7  0.3 - 1.2 mg/dL   GFR calc non Af Amer 35 (*) >90 mL/min   GFR calc Af Amer 41 (*) >90 mL/min   Comment: (NOTE)     The eGFR has been calculated using the CKD EPI equation.     This calculation has not been validated in all clinical situations.     eGFR's persistently <90 mL/min signify possible Chronic Kidney     Disease.  LIPASE, BLOOD     Status: None   Collection Time    12/13/12  6:35 PM      Result Value Range   Lipase 12  11 - 59 U/L  URINALYSIS, ROUTINE W REFLEX MICROSCOPIC     Status: Abnormal   Collection Time    12/13/12  7:39 PM      Result Value Range   Color, Urine AMBER (*) YELLOW   Comment: BIOCHEMICALS MAY BE AFFECTED BY COLOR   APPearance TURBID (*) CLEAR   Specific Gravity, Urine 1.031 (*) 1.005 - 1.030   pH 5.5  5.0 - 8.0   Glucose, UA NEGATIVE  NEGATIVE mg/dL   Hgb urine dipstick  LARGE (*) NEGATIVE   Bilirubin Urine MODERATE (*) NEGATIVE   Ketones, ur NEGATIVE  NEGATIVE mg/dL   Protein, ur 100 (*) NEGATIVE mg/dL   Urobilinogen, UA 0.2  0.0 - 1.0 mg/dL   Nitrite NEGATIVE  NEGATIVE   Leukocytes, UA SMALL (*) NEGATIVE  URINE MICROSCOPIC-ADD ON     Status: Abnormal   Collection Time    12/13/12  7:39 PM      Result Value Range   Squamous Epithelial / LPF RARE  RARE   WBC, UA 11-20  <3 WBC/hpf   RBC / HPF TOO NUMEROUS TO COUNT  <3 RBC/hpf   Bacteria, UA FEW (*) RARE   Urine-Other AMORPHOUS URATES/PHOSPHATES     Ct Abdomen Pelvis W Contrast  12/13/2012   CLINICAL DATA:  Abdomen pain  EXAM: CT ABDOMEN AND PELVIS WITH CONTRAST  TECHNIQUE: Multidetector CT imaging of the abdomen and pelvis was performed using the standard protocol following bolus administration of intravenous contrast.  CONTRAST:  142mL OMNIPAQUE IOHEXOL 300 MG/ML  SOLN  COMPARISON:  None.  FINDINGS: The appendix is dilated partial air-filled with stranding around the appendix. There is free air and free fluid in the right lower quadrant and free fluid in the pelvis. Inflammation surrounding ileal small bowel loops with bowel wall thickening. There are dilated more proximal small bowel loops consistent with ileus.  The liver, spleen are normal. There is small calcified granuloma within the spleen. Small amount of ascites is noted around the liver and spleen. The gallbladder, pancreas, adrenal glands are normal. There are small cysts in bilateral kidneys, largest in the posterior midpole left kidney measuring 1.2 cm. There is no hydronephrosis bilaterally. There is atherosclerosis of the abdominal aorta without aneurysmal dilatation.  Decompressed bladder limits evaluation. The uterus is normal for the patient's age. Minimal dependent atelectasis of the  posterior right lung base. No acute abnormalities identified within the visualized bones.  IMPRESSION: Findings consistent with perforated bowel in the right lower  quadrant with free air and free fluid in the right lower quadrant probably either arising from the appendix or distal ileal small bowel loops. The appendix is dilated and enlarged. There are also inflamed thick wall ileal small bowel loops.  I discussed the results with Dr. Tanna Furry on December 13, 2012 at 8:45 p.m.   Electronically Signed   By: Abelardo Diesel M.D.   On: 12/13/2012 20:49    Review of Systems  Constitutional: Positive for fever and chills.  Respiratory: Negative for shortness of breath.   Cardiovascular: Negative for chest pain.  Gastrointestinal: Positive for abdominal pain. Negative for nausea, vomiting and diarrhea.  Genitourinary: Negative for dysuria and hematuria.  Endo/Heme/Allergies:       No dvt    Blood pressure 110/68, pulse 105, temperature 99.1 F (37.3 C), temperature source Oral, resp. rate 16, SpO2 95.00%. Physical Exam  Constitutional: She appears well-developed and well-nourished. No distress.  HENT:  Head: Normocephalic and atraumatic.  Eyes: EOM are normal. No scleral icterus.  Neck: Neck supple.  Cardiovascular: Normal rate and regular rhythm.   Murmur heard. Respiratory: Effort normal and breath sounds normal.  GI: Soft. There is tenderness (Tenderness to palpation and percussion in the lower abdomen. The right lower quadrant greater than left lower quadrant. Hypoactive bowel sounds. No scars. No hernias. No masses.).  Musculoskeletal: She exhibits no edema.  Lymphadenopathy:    She has no cervical adenopathy.  Neurological: She is alert.  Skin: Skin is warm and dry.  Psychiatric: She has a normal mood and affect. Her behavior is normal.     Assessment/Plan Perforated viscus-appendix versus small bowel. Also has a number of significant comorbidities including CLL. She has chronic anemia because of this.  To the operating room for emergency laparoscopy possible laparotomy with possible appendectomy or bowel resection, possible ileostomy or  colostomy.  I have explained the procedure and risks.  Risks include but are not limited to bleeding, infection, wound problems, anesthesia, need for colostomy, injury to intraabominal organs (such as intestine, spleen, kidney, bladder, ureter, etc.), ileus.  She seems to understand and agrees to proceed.   We will start her on broad-spectrum antibiotics.  Tuleen Mandelbaum J 12/13/2012, 9:20 PM

## 2012-12-13 NOTE — ED Notes (Signed)
Bed: WA20 Expected date:  Expected time:  Means of arrival:  Comments: EMS-abdominal pain

## 2012-12-14 ENCOUNTER — Encounter (HOSPITAL_COMMUNITY): Payer: Self-pay | Admitting: *Deleted

## 2012-12-14 DIAGNOSIS — I421 Obstructive hypertrophic cardiomyopathy: Secondary | ICD-10-CM

## 2012-12-14 DIAGNOSIS — I10 Essential (primary) hypertension: Secondary | ICD-10-CM

## 2012-12-14 DIAGNOSIS — K3532 Acute appendicitis with perforation and localized peritonitis, without abscess: Secondary | ICD-10-CM | POA: Diagnosis present

## 2012-12-14 DIAGNOSIS — K352 Acute appendicitis with generalized peritonitis, without abscess: Secondary | ICD-10-CM

## 2012-12-14 DIAGNOSIS — I359 Nonrheumatic aortic valve disorder, unspecified: Secondary | ICD-10-CM

## 2012-12-14 DIAGNOSIS — C9110 Chronic lymphocytic leukemia of B-cell type not having achieved remission: Secondary | ICD-10-CM

## 2012-12-14 LAB — GLUCOSE, CAPILLARY
Glucose-Capillary: 107 mg/dL — ABNORMAL HIGH (ref 70–99)
Glucose-Capillary: 124 mg/dL — ABNORMAL HIGH (ref 70–99)
Glucose-Capillary: 139 mg/dL — ABNORMAL HIGH (ref 70–99)
Glucose-Capillary: 94 mg/dL (ref 70–99)

## 2012-12-14 LAB — BASIC METABOLIC PANEL
CO2: 25 mEq/L (ref 19–32)
Calcium: 8.5 mg/dL (ref 8.4–10.5)
Creatinine, Ser: 1.37 mg/dL — ABNORMAL HIGH (ref 0.50–1.10)
GFR calc non Af Amer: 36 mL/min — ABNORMAL LOW (ref >90)
Glucose, Bld: 141 mg/dL — ABNORMAL HIGH (ref 70–99)
Potassium: 3.8 mEq/L (ref 3.5–5.1)
Sodium: 133 mEq/L — ABNORMAL LOW (ref 135–145)

## 2012-12-14 LAB — CBC
Hemoglobin: 10 g/dL — ABNORMAL LOW (ref 12.0–15.0)
MCH: 31.8 pg (ref 26.0–34.0)
Platelets: 175 10*3/uL (ref 150–400)
RBC: 3.14 MIL/uL — ABNORMAL LOW (ref 3.87–5.11)
WBC: 13.3 10*3/uL — ABNORMAL HIGH (ref 4.0–10.5)

## 2012-12-14 LAB — MRSA PCR SCREENING: MRSA by PCR: NEGATIVE

## 2012-12-14 MED ORDER — KCL IN DEXTROSE-NACL 20-5-0.45 MEQ/L-%-% IV SOLN
INTRAVENOUS | Status: DC
  Administered 2012-12-14: 23:00:00 via INTRAVENOUS
  Administered 2012-12-14: 100 mL via INTRAVENOUS
  Administered 2012-12-14 – 2012-12-18 (×5): via INTRAVENOUS
  Filled 2012-12-14 (×11): qty 1000

## 2012-12-14 MED ORDER — HYDROMORPHONE HCL PF 1 MG/ML IJ SOLN: 0.5000 mg | INTRAMUSCULAR | Status: DC | PRN

## 2012-12-14 MED ORDER — PANTOPRAZOLE SODIUM 40 MG IV SOLR: 40.0000 mg | INTRAVENOUS | Status: DC

## 2012-12-14 MED ORDER — METOPROLOL TARTRATE 1 MG/ML IV SOLN: 5.0000 mg | Freq: Four times a day (QID) | INTRAVENOUS | Status: DC | PRN

## 2012-12-14 MED ORDER — INSULIN ASPART 100 UNIT/ML ~~LOC~~ SOLN: 0.0000 [IU] | SUBCUTANEOUS | Status: DC

## 2012-12-14 MED ORDER — INSULIN ASPART 100 UNIT/ML ~~LOC~~ SOLN
0.0000 [IU] | SUBCUTANEOUS | Status: DC
  Administered 2012-12-14 (×2): 2 [IU] via SUBCUTANEOUS
  Administered 2012-12-14: 3 [IU] via SUBCUTANEOUS
  Administered 2012-12-14 – 2012-12-15 (×7): 2 [IU] via SUBCUTANEOUS
  Administered 2012-12-16: 04:00:00 via SUBCUTANEOUS
  Administered 2012-12-17 – 2012-12-18 (×4): 2 [IU] via SUBCUTANEOUS
  Administered 2012-12-18: 21:00:00 via SUBCUTANEOUS
  Administered 2012-12-19 (×2): 2 [IU] via SUBCUTANEOUS

## 2012-12-14 MED ORDER — ENOXAPARIN SODIUM 40 MG/0.4ML ~~LOC~~ SOLN
40.0000 mg | SUBCUTANEOUS | Status: DC
  Administered 2012-12-14 – 2012-12-22 (×9): 40 mg via SUBCUTANEOUS
  Filled 2012-12-14 (×9): qty 0.4

## 2012-12-14 MED ORDER — ONDANSETRON HCL 4 MG PO TABS: 4.0000 mg | ORAL_TABLET | Freq: Four times a day (QID) | ORAL | Status: DC | PRN

## 2012-12-14 MED ORDER — ONDANSETRON HCL 4 MG/2ML IJ SOLN: 4.0000 mg | Freq: Four times a day (QID) | INTRAMUSCULAR | Status: DC | PRN

## 2012-12-14 MED ORDER — FENTANYL CITRATE 0.05 MG/ML IJ SOLN
INTRAMUSCULAR | Status: AC
  Filled 2012-12-14: qty 2

## 2012-12-14 MED ORDER — FENTANYL CITRATE 0.05 MG/ML IJ SOLN
25.0000 ug | INTRAMUSCULAR | Status: DC | PRN
  Administered 2012-12-14: 25 ug via INTRAVENOUS

## 2012-12-14 MED ORDER — SODIUM CHLORIDE 0.9 % IV SOLN
1.0000 g | INTRAVENOUS | Status: DC
  Administered 2012-12-14 – 2012-12-21 (×8): 1 g via INTRAVENOUS
  Filled 2012-12-14 (×10): qty 1

## 2012-12-14 MED ORDER — SODIUM CHLORIDE 0.9 % IJ SOLN
INTRAMUSCULAR | Status: AC
  Administered 2012-12-14: 01:00:00
  Filled 2012-12-14: qty 3

## 2012-12-14 MED ORDER — ONDANSETRON HCL 4 MG/2ML IJ SOLN
4.0000 mg | INTRAMUSCULAR | Status: DC | PRN
  Administered 2012-12-15 – 2012-12-17 (×3): 4 mg via INTRAVENOUS
  Filled 2012-12-14 (×3): qty 2

## 2012-12-14 MED ORDER — MORPHINE SULFATE 2 MG/ML IJ SOLN
1.0000 mg | INTRAMUSCULAR | Status: DC | PRN
  Administered 2012-12-14 – 2012-12-18 (×15): 2 mg via INTRAVENOUS
  Administered 2012-12-18: 4 mg via INTRAVENOUS
  Administered 2012-12-18: 2 mg via INTRAVENOUS
  Filled 2012-12-14 (×12): qty 1
  Filled 2012-12-14: qty 2
  Filled 2012-12-14 (×4): qty 1

## 2012-12-14 MED ORDER — PHENOL 1.4 % MT LIQD
1.0000 | OROMUCOSAL | Status: DC | PRN
  Administered 2012-12-14 – 2012-12-15 (×3): 1 via OROMUCOSAL
  Filled 2012-12-14: qty 177

## 2012-12-14 MED ORDER — PANTOPRAZOLE SODIUM 40 MG IV SOLR
40.0000 mg | Freq: Every day | INTRAVENOUS | Status: DC
  Administered 2012-12-14 – 2012-12-19 (×6): 40 mg via INTRAVENOUS
  Filled 2012-12-14 (×7): qty 40

## 2012-12-14 NOTE — Progress Notes (Signed)
[facility] PROGRESS NOTE  Norma Smith [id number] DOB: 14-Apr-1933 DOA: 12/13/2012  PCP: Cathlean Cower, MD  Brief HPI: 79 year old female with history of hypertension, moderate AS with HOCM, ( EF of 65-70% as per echo in June 2014, follows with Dr. Stanford Breed), diabetes mellitus on oral hypoglycemics, hypertension, iron deficiency anemia, CLL (currently stable and follows with Dr Juliann Mule) presented to the hospital with diffuse abdominal pain. She was found to have perforated appendicitis and was taken to the OR. Medicine was consulted for assistance with medical problems.   Past medical history:  Past Medical History  Diagnosis Date  . Skin cancer   . Iron deficiency 12/01/2010  . Allergy   . Diabetes mellitus   . Hyperlipidemia   . Hypertension   . Anemia   . Osteopenia   . Dry skin dermatitis 12/01/2010  . CLL (chronic lymphoblastic leukemia) 12/01/2010  . Aortic stenosis, moderate 06/16/2011    Primary Service: General Surgery  Procedures: Appendectomy  Antibiotics: Ertapenem 11/22  Subjective: Patient feels well. Complains of throat pain. No significant abdominal pain. Denies shortness of breath or chest pain.  Objective: Vital Signs  Filed Vitals:   12/14/12 0139 12/14/12 0200 12/14/12 0400 12/14/12 0600  BP:   135/37 114/36  Pulse:   93 97  Temp: 98.2 F (36.8 C)  98.2 F (36.8 C)   TempSrc:   Oral   Resp:   18 24  Height:  5' (1.524 m)    Weight:  71 kg (156 lb 8.4 oz)    SpO2:   96% 93%    Filed Weights   12/14/12 0200  Weight: 71 kg (156 lb 8.4 oz)    Intake/Output from previous day: 11/22 0701 - 11/23 0700 In: 2050 [I.V.:2050] Out: 545 [Urine:295; Drains:250]  General appearance: alert, cooperative, appears stated age and no distress Head: Normocephalic, without obvious abnormality, atraumatic Eyes: conjunctivae/corneas clear. PERRL, EOM's intact.  Resp: clear to auscultation bilaterally Cardio: regular rate and rhythm, S1, S2 normal. Loud  systolic murmur over precordium. No click, rub or gallop GI: Covered in dressing with a drain. No BS.  Extremities: extremities normal, atraumatic, no cyanosis or edema Pulses: 2+ and symmetric Skin: Skin color, texture, turgor normal. No rashes or lesions Neurologic: Alert and oriented x 2. No focal deficits  Lab Results:  Basic Metabolic Panel:  Recent Labs Lab 12/13/12 1835 12/14/12 0345  NA 136 133*  K 3.5 3.8  CL 97 98  CO2 26 25  GLUCOSE 153* 141*  BUN 41* 42*  CREATININE 1.40* 1.37*  CALCIUM 9.3 8.5   Liver Function Tests:  Recent Labs Lab 12/13/12 1835  AST 22  ALT 10  ALKPHOS 45  BILITOT 0.7  PROT 6.8  ALBUMIN 3.3*    Recent Labs Lab 12/13/12 1835  LIPASE 12   CBC:  Recent Labs Lab 12/13/12 1835 12/14/12 0345  WBC 20.4* 13.3*  NEUTROABS 14.5*  --   HGB 10.5* 10.0*  HCT 31.0* 29.4*  MCV 95.7 93.6  PLT 216 175   CBG:  Recent Labs Lab 12/14/12 0404  GLUCAP 134*    Recent Results (from the past 240 hour(s))  MRSA PCR SCREENING     Status: None   Collection Time    12/14/12  1:30 AM      Result Value Range Status   MRSA by PCR NEGATIVE  NEGATIVE Final   Comment:            The GeneXpert MRSA Assay (FDA  approved for NASAL specimens     only), is one component of a     comprehensive MRSA colonization     surveillance program. It is not     intended to diagnose MRSA     infection nor to guide or     monitor treatment for     MRSA infections.      Studies/Results: Ct Abdomen Pelvis W Contrast  12/13/2012   CLINICAL DATA:  Abdomen pain  EXAM: CT ABDOMEN AND PELVIS WITH CONTRAST  TECHNIQUE: Multidetector CT imaging of the abdomen and pelvis was performed using the standard protocol following bolus administration of intravenous contrast.  CONTRAST:  117mL OMNIPAQUE IOHEXOL 300 MG/ML  SOLN  COMPARISON:  None.  FINDINGS: The appendix is dilated partial air-filled with stranding around the appendix. There is free air and free fluid in  the right lower quadrant and free fluid in the pelvis. Inflammation surrounding ileal small bowel loops with bowel wall thickening. There are dilated more proximal small bowel loops consistent with ileus.  The liver, spleen are normal. There is small calcified granuloma within the spleen. Small amount of ascites is noted around the liver and spleen. The gallbladder, pancreas, adrenal glands are normal. There are small cysts in bilateral kidneys, largest in the posterior midpole left kidney measuring 1.2 cm. There is no hydronephrosis bilaterally. There is atherosclerosis of the abdominal aorta without aneurysmal dilatation.  Decompressed bladder limits evaluation. The uterus is normal for the patient's age. Minimal dependent atelectasis of the posterior right lung base. No acute abnormalities identified within the visualized bones.  IMPRESSION: Findings consistent with perforated bowel in the right lower quadrant with free air and free fluid in the right lower quadrant probably either arising from the appendix or distal ileal small bowel loops. The appendix is dilated and enlarged. There are also inflamed thick wall ileal small bowel loops.  I discussed the results with Dr. Tanna Furry on December 13, 2012 at 8:45 p.m.   Electronically Signed   By: Abelardo Diesel M.D.   On: 12/13/2012 20:49   Dg Chest Port 1 View  12/13/2012   CLINICAL DATA:  Preop, no chest pain  EXAM: PORTABLE CHEST - 1 VIEW  COMPARISON:  None  FINDINGS: Mild to moderate cardiac enlargement. Aortic arch calcification. Lungs clear.  IMPRESSION: Cardiac enlargement.  No acute findings.   Electronically Signed   By: Skipper Cliche M.D.   On: 12/13/2012 21:44    Medications:  Scheduled: . enoxaparin (LOVENOX) injection  40 mg Subcutaneous Q24H  . ertapenem (INVANZ) IV  1 g Intravenous Q24H  . insulin aspart  0-15 Units Subcutaneous Q4H  . pantoprazole (PROTONIX) IV  40 mg Intravenous Daily   Continuous: . dextrose 5 % and 0.45 % NaCl  with KCl 20 mEq/L 100 mL (12/14/12 0209)   [id number], morphine injection, ondansetron (ZOFRAN) IV, ondansetron  Assessment/Plan:  Principal Problem:   Acute fulminating appendicitis with perforation and peritonitis Active Problems:   Hypertension   CLL (chronic lymphocytic leukemia)   Aortic stenosis, moderate   Hypertrophic obstructive cardiomyopathy(425.11)    Perforated acute appendicitis with peritonitis  Status post laparoscopic appendectomy. Management per surgery. Incentive spirometry.   Aortic stenosis and HOCM  Last echo 5 months back with EF of 65-70% with HOCM. Being followed by Dr Stanford Breed. Patient is on metoprolol and amlodipine at home. Continue PRn metoprolol IV.  Resume oral medications as soon as able. Avoid afterload reducing agents like hydralazine. Aspirin on hold. Consult cardiology if  cardiac status changes. Assess volume status closely. Watch for volume overload.  Diabetes mellitus  Patient on different oral hypoglycemics. Last A1c from May of 4.7. Blood glucose currently stable. Will be maintained on sliding scale insulin. Patient remains n.p.o. for now.   Hypertension  Blood pressure is currently stable. Continue when necessary IV Lopressor.   Acute kidney injury  Likely prerenal with dehydration. Holding Lasix and HCTZ. Monitor with IV fluids.   Iron deficiency anemia  Hemoglobin currently stable. Iron sulfate on hold.   CLL  Currently stable and not on any treatment. Followed in oncology as outpatient.   Hyperlipidemia  Lipitor on hold.   Code Status: Full code  DVT Prophylaxis: SCD's    Family Communication: Discussed with patient.  Disposition Plan: Will likely stay in stepdown for now. Will need telemetry if transferred.     LOS: 1 day   [facility] Pager [phone] 12/14/2012, 8:06 AM  If 8PM-8AM, please contact night-coverage at [email or web address], password [facility]

## 2012-12-14 NOTE — Anesthesia Postprocedure Evaluation (Signed)
  Anesthesia Post-op Note  Patient: Norma Smith  Procedure(s) Performed: Procedure(s) (LRB): APPENDECTOMY LAPAROSCOPIC (N/A)  Patient Location: PACU  Anesthesia Type: General  Level of Consciousness: awake and alert   Airway and Oxygen Therapy: Patient Spontanous Breathing  Post-op Pain: mild  Post-op Assessment: Post-op Vital signs reviewed, Patient's Cardiovascular Status Stable, Respiratory Function Stable, Patent Airway and No signs of Nausea or vomiting  Last Vitals:  Filed Vitals:   12/14/12 0400  BP: 135/37  Pulse: 93  Temp: 36.8 C  Resp: 18    Post-op Vital Signs: stable   Complications: No apparent anesthesia complications lp

## 2012-12-14 NOTE — Progress Notes (Signed)
1 Day Post-Op  Subjective: She c/o incisional soreness.  Operative findings explain to her.  Objective: Vital signs in last 24 hours: Temp:  [98.2 F (36.8 C)-99.1 F (37.3 C)] 98.2 F (36.8 C) (11/23 0400) Pulse Rate:  [81-105] 97 (11/23 0600) Resp:  [15-24] 24 (11/23 0600) BP: (95-142)/(36-69) 114/36 mmHg (11/23 0600) SpO2:  [93 %-100 %] 93 % (11/23 0600) Weight:  [156 lb 8.4 oz (71 kg)] 156 lb 8.4 oz (71 kg) (11/23 0200) Last BM Date: 12/12/12  Intake/Output from previous day: 11/22 0701 - 11/23 0700 In: 2050 [I.V.:2050] Out: 545 [Urine:295; Drains:250] Intake/Output this shift:    PE: General- In NAD Abdomen-soft, dressings dry, thin serosanguinous drain output  Lab Results:   Recent Labs  12/13/12 1835 12/14/12 0345  WBC 20.4* 13.3*  HGB 10.5* 10.0*  HCT 31.0* 29.4*  PLT 216 175   BMET  Recent Labs  12/13/12 1835 12/14/12 0345  NA 136 133*  K 3.5 3.8  CL 97 98  CO2 26 25  GLUCOSE 153* 141*  BUN 41* 42*  CREATININE 1.40* 1.37*  CALCIUM 9.3 8.5   PT/INR No results found for this basename: LABPROT, INR,  in the last 72 hours Comprehensive Metabolic Panel:    Component Value Date/Time   NA 133* 12/14/2012 0345   NA 142 12/01/2012 0949   K 3.8 12/14/2012 0345   K 4.3 12/01/2012 0949   CL 98 12/14/2012 0345   CL 105 12/14/2011 0851   CO2 25 12/14/2012 0345   CO2 28 12/01/2012 0949   BUN 42* 12/14/2012 0345   BUN 20.5 12/01/2012 0949   CREATININE 1.37* 12/14/2012 0345   CREATININE 1.1 12/01/2012 0949   GLUCOSE 141* 12/14/2012 0345   GLUCOSE 103 12/01/2012 0949   GLUCOSE 100* 12/14/2011 0851   CALCIUM 8.5 12/14/2012 0345   CALCIUM 10.1 12/01/2012 0949   AST 22 12/13/2012 1835   AST 22 12/01/2012 0949   ALT 10 12/13/2012 1835   ALT 11 12/01/2012 0949   ALKPHOS 45 12/13/2012 1835   ALKPHOS 50 12/01/2012 0949   BILITOT 0.7 12/13/2012 1835   BILITOT 0.53 12/01/2012 0949   PROT 6.8 12/13/2012 1835   PROT 6.9 12/01/2012 0949   ALBUMIN 3.3*  12/13/2012 1835   ALBUMIN 3.7 12/01/2012 0949     Studies/Results: Ct Abdomen Pelvis W Contrast  12/13/2012   CLINICAL DATA:  Abdomen pain  EXAM: CT ABDOMEN AND PELVIS WITH CONTRAST  TECHNIQUE: Multidetector CT imaging of the abdomen and pelvis was performed using the standard protocol following bolus administration of intravenous contrast.  CONTRAST:  154mL OMNIPAQUE IOHEXOL 300 MG/ML  SOLN  COMPARISON:  None.  FINDINGS: The appendix is dilated partial air-filled with stranding around the appendix. There is free air and free fluid in the right lower quadrant and free fluid in the pelvis. Inflammation surrounding ileal small bowel loops with bowel wall thickening. There are dilated more proximal small bowel loops consistent with ileus.  The liver, spleen are normal. There is small calcified granuloma within the spleen. Small amount of ascites is noted around the liver and spleen. The gallbladder, pancreas, adrenal glands are normal. There are small cysts in bilateral kidneys, largest in the posterior midpole left kidney measuring 1.2 cm. There is no hydronephrosis bilaterally. There is atherosclerosis of the abdominal aorta without aneurysmal dilatation.  Decompressed bladder limits evaluation. The uterus is normal for the patient's age. Minimal dependent atelectasis of the posterior right lung base. No acute abnormalities identified within the visualized  bones.  IMPRESSION: Findings consistent with perforated bowel in the right lower quadrant with free air and free fluid in the right lower quadrant probably either arising from the appendix or distal ileal small bowel loops. The appendix is dilated and enlarged. There are also inflamed thick wall ileal small bowel loops.  I discussed the results with Dr. Tanna Furry on December 13, 2012 at 8:45 p.m.   Electronically Signed   By: Abelardo Diesel M.D.   On: 12/13/2012 20:49   Dg Chest Port 1 View  12/13/2012   CLINICAL DATA:  Preop, no chest pain  EXAM:  PORTABLE CHEST - 1 VIEW  COMPARISON:  None  FINDINGS: Mild to moderate cardiac enlargement. Aortic arch calcification. Lungs clear.  IMPRESSION: Cardiac enlargement.  No acute findings.   Electronically Signed   By: Skipper Cliche M.D.   On: 12/13/2012 21:44    Anti-infectives: Anti-infectives   Start     Dose/Rate Route Frequency Ordered Stop   12/14/12 2100  ertapenem (INVANZ) 1 g in sodium chloride 0.9 % 50 mL IVPB     1 g 100 mL/hr over 30 Minutes Intravenous Every 24 hours 12/14/12 0148     12/13/12 2130  [MAR Hold]  ertapenem (INVANZ) 1 g in sodium chloride 0.9 % 50 mL IVPB     (On MAR Hold since 12/13/12 2152)  Comments:  stat   1 g 100 mL/hr over 30 Minutes Intravenous  Once 12/13/12 2119 12/13/12 2208      Assessment Principal Problem:   Acute fulminating appendicitis with perforation and peritonitis-stable overnight; at high risk for prolonged ileus and postop abscess Active Problems:   Hypertension   CLL (chronic lymphocytic leukemia)   Aortic stenosis, moderate   HOCM    LOS: 1 day   Plan: Continue broad spectrum abxs and ng tube.  OOB.   Nik Gorrell J 12/14/2012

## 2012-12-14 NOTE — Consult Note (Addendum)
Requesting physician: Dr Zella Richer  Reason for consultation: Management of underlying medical issues  History of Present Illness: 79 year old female with history of hypertension, moderate  AS with HOCM, ( EF of 65-70% as per echo in June 2014, follows with Dr. Stanford Breed), diabetes mellitus on oral hypoglycemics, hypertension, iron deficiency anemia, CLL (currently stable and follows with Dr Juliann Mule) presented to the hospital with diffuse abdominal pain since early yesterday morning. The pain was  localized over the lower abdominal area and was persistent and progressive. She did not have any fever or chills. She had mild nausea a week an episode of vomiting the previous day. The pain was aggravated by minimal movement and cough. Denies any diarrhea or constipation, denied melena. Denied any urinary symptoms, chest pain or palpitations. When she presented to the ED her vitals were stable. Abdominal exam showed abdominal distention with generalized tenderness, rebound and guarding. Labs showed significant leukocytosis. A CT scan of the abdomen and pelvis with contrast showed "perforated bowel in the right lower quadrant with free air and free fluid. Also commented the appendix to be dilated and enlarged with inflamed thick walled ileal and small bowel loops." Given findings of perforated appendix versus perforated distal small bowel surgery consult was called and patient was taken to OR. Patient had a laparoscopy done which showed acute perforated appendicitis with peritonitis. She had laparoscopic appendectomy done. Patient tolerated the procedure well. An NG tube and a Foley catheter was placed preoperatively. Patient tolerated the procedure well and being admitted to step down unit. Hospitalist consulted for medical management of her underlying issues including HOCM, aortic stenosis, hypertension and diabetes. On my exam patient's vitals are stable with heart rate in 80s, stable blood pressure, afebrile,  respiratory rate of 18 with O2 sat in the mid 90s on 3 L via nasal cannula. She denies any abdominal pain at this time and does not appear to be in discomfort. She has not had any of her medications since yesterday morning. EKG on admission showed normal sinus rhythm with PVCs.  Allergies:   Allergies  Allergen Reactions  . Codeine Other (See Comments)    Passed out  . Penicillins Rash      Past Medical History  Diagnosis Date  . Skin cancer   . Iron deficiency 12/01/2010  . Allergy   . Diabetes mellitus   . Hyperlipidemia   . Hypertension   . Anemia   . Osteopenia   . Dry skin dermatitis 12/01/2010  . CLL (chronic lymphoblastic leukemia) 12/01/2010  . Aortic stenosis, moderate 06/16/2011    Past Surgical History  Procedure Laterality Date  . Breast biopsy  1976  . Cataract extraction      Medications: The following are her home medications Amlodipine 5 mg by mouth daily Lipitor 20 mg by mouth daily Valium 2.5 mg by mouth daily Metoprolol 12.5 mg by mouth daily HCTZ 25 mg by mouth daily Actos 30 mg by mouth daily Metformin 1000 mg by mouth daily Irbesartan 300 mg by mouth daily  Tylenol 650 mg as needed for pain Aspirin 81 mg by mouth daily Calcium carbonate-vitamin D 600-400 mg one tablet 2 times daily Ferrous sulfate 325 mg 1 tablet 2 times daily Multivitamin with minerals 1 tablet daily   Scheduled Meds:  Continuous Infusions: . dextrose 5 % and 0.45 % NaCl with KCl 20 mEq/L     PRN Meds:.fentaNYL, HYDROmorphone (DILAUDID) injection, [MAR HOLD]  morphine injection  Social History:  reports that she has quit  smoking. She does not have any smokeless tobacco history on file. She reports that she drinks alcohol. She reports that she does not use illicit drugs.  Family History  Problem Relation Age of Onset  . Diabetes Other   . Cancer Other     breast cancer  . Heart disease Other   . Diabetes Other     Review of Systems:  Constitutional: Negative for  fever, chills, diaphoresis, appetite change and fatigue.  HEENT: Negative for congestion, sore throat, rhinorrhea, , trouble swallowing, neck pain, neck stiffness and tinnitus.   Respiratory: Negative for SOB, DOE, cough, chest tightness,  and wheezing.   Cardiovascular: Negative for chest pain, palpitations and leg swelling.  Gastrointestinal:  nausea, vomiting, abdominal pain, abdominal distention, denies diarrhea, constipation, blood in stool.  Genitourinary: Denies dysuria, urgency, frequency, hematuria, flank pain and difficulty urinating.  Endocrine: Negative for polyuria, polydipsia Musculoskeletal: Denies myalgias, back pain, joint swelling, arthralgias Neurological: Denies dizziness, seizures, syncope, weakness, light-headedness, numbness and headaches.     Physical Exam:  Filed Vitals:   12/13/12 1802  BP: 110/68  Pulse: 105  Temp: 99.1 F (37.3 C)  TempSrc: Oral  Resp: 16  SpO2: 95%     Intake/Output Summary (Last 24 hours) at 12/14/12 0028 Last data filed at 12/13/12 2344  Gross per 24 hour  Intake   1300 ml  Output      0 ml  Net   1300 ml    General: Elderly female lying in bed in no significant distress, appears fatigued HEENT: No pallor, no icterus, dry oral mucosa, NG tube in place Chest: Clear to auscultation bilaterally, no added sounds CVS: Normal S1 and S2 with Systolic murmur 4/6, no rubs or gallop Abdomen: Distended with postsurgical dressing. Absent bowel sounds Extremities: Warm, no edema CNS: AAO x3  Labs on Admission:  CBC:    Component Value Date/Time   WBC 20.4* 12/13/2012 1835   WBC 8.4 12/01/2012 0949   HGB 10.5* 12/13/2012 1835   HGB 10.7* 12/01/2012 0949   HCT 31.0* 12/13/2012 1835   HCT 32.7* 12/01/2012 0949   PLT 216 12/13/2012 1835   PLT 175 12/01/2012 0949   MCV 95.7 12/13/2012 1835   MCV 96.7 12/01/2012 0949   NEUTROABS 14.5* 12/13/2012 1835   NEUTROABS 4.1 12/01/2012 0949   LYMPHSABS 4.5* 12/13/2012 1835   LYMPHSABS  3.5* 12/01/2012 0949   MONOABS 1.4* 12/13/2012 1835   MONOABS 0.5 12/01/2012 0949   EOSABS 0.0 12/13/2012 1835   EOSABS 0.1 12/01/2012 0949   BASOSABS 0.0 12/13/2012 1835   BASOSABS 0.1 12/01/2012 [id number]    Basic Metabolic Panel:    Component Value Date/Time   NA 136 12/13/2012 1835   NA 142 12/01/2012 0949   K 3.5 12/13/2012 1835   K 4.3 12/01/2012 0949   CL 97 12/13/2012 1835   CL 105 12/14/2011 0851   CO2 26 12/13/2012 1835   CO2 28 12/01/2012 0949   BUN 41* 12/13/2012 1835   BUN 20.5 12/01/2012 0949   CREATININE 1.40* 12/13/2012 1835   CREATININE 1.1 12/01/2012 0949   GLUCOSE 153* 12/13/2012 1835   GLUCOSE 103 12/01/2012 0949   GLUCOSE 100* 12/14/2011 0851   CALCIUM 9.3 12/13/2012 1835   CALCIUM 10.1 12/01/2012 0949    Radiological Exams on Admission: Ct Abdomen Pelvis W Contrast  12/13/2012   CLINICAL DATA:  Abdomen pain  EXAM: CT ABDOMEN AND PELVIS WITH CONTRAST  TECHNIQUE: Multidetector CT imaging of the abdomen and pelvis was performed  using the standard protocol following bolus administration of intravenous contrast.  CONTRAST:  141mL OMNIPAQUE IOHEXOL 300 MG/ML  SOLN  COMPARISON:  None.  FINDINGS: The appendix is dilated partial air-filled with stranding around the appendix. There is free air and free fluid in the right lower quadrant and free fluid in the pelvis. Inflammation surrounding ileal small bowel loops with bowel wall thickening. There are dilated more proximal small bowel loops consistent with ileus.  The liver, spleen are normal. There is small calcified granuloma within the spleen. Small amount of ascites is noted around the liver and spleen. The gallbladder, pancreas, adrenal glands are normal. There are small cysts in bilateral kidneys, largest in the posterior midpole left kidney measuring 1.2 cm. There is no hydronephrosis bilaterally. There is atherosclerosis of the abdominal aorta without aneurysmal dilatation.  Decompressed bladder limits evaluation. The  uterus is normal for the patient's age. Minimal dependent atelectasis of the posterior right lung base. No acute abnormalities identified within the visualized bones.  IMPRESSION: Findings consistent with perforated bowel in the right lower quadrant with free air and free fluid in the right lower quadrant probably either arising from the appendix or distal ileal small bowel loops. The appendix is dilated and enlarged. There are also inflamed thick wall ileal small bowel loops.  I discussed the results with Dr. Tanna Furry on December 13, 2012 at 8:45 p.m.   Electronically Signed   By: Abelardo Diesel M.D.   On: 12/13/2012 20:49   Dg Chest Port 1 View  12/13/2012   CLINICAL DATA:  Preop, no chest pain  EXAM: PORTABLE CHEST - 1 VIEW  COMPARISON:  None  FINDINGS: Mild to moderate cardiac enlargement. Aortic arch calcification. Lungs clear.  IMPRESSION: Cardiac enlargement.  No acute findings.   Electronically Signed   By: Skipper Cliche M.D.   On: 12/13/2012 21:44    Assessment/Plan Perforated acute appendicitis with peritonitis Status post laparoscopic appendectomy. Patient has NG placed. Currently  n.p.o. with bowel rest and IV fluids with D5 half normal saline. Pain control and IV fluids per primary team. Serial abdominal exam. Prn zofran for nausea and vomiting.  Monitor I/O  -Patient being admitted to SDU.  Aortic stenosis and HOCM  last echo 5 months back with EF of 65-70% with HOCM. Being followed by Dr Dr Stanford Breed. Patient is on metoprolol and amlodipine. By mouth medications on board. I would place her on 5 mg  IV Lopressor every 6 hours as needed for heart rate greater than 110 beats / min. Avoid afterload reducing agents like hydralazine. -we will get cardiology consult for any concerning issues. patient stable currently -aspirin on hold.  Diabetes mellitus Patient on different oral hypoglycemics. Last A1c from May of 4.7. Blood glucose currently stable. Will be maintained on sliding scale  insulin. Patient remains n.p.o. for now.  hypertension Blood pressure is currently stable. Plan on when necessary IV Lopressor. Add IV labetalol if BP further elevated.   Acute kidney injury Likely prerenal with dehydration. Holding Lasix and HCTZ. Monitor with IV fluids.  Iron deficiency anemia  Hemoglobin currently stable. Iron sulfate on hold.  CLL Currently stable and not on any treatment. Followed in oncology as outpatient.  Hyperlipidemia Lipitor on hold.  DVT prophylaxis: SCDs boots.  CODE STATUS full code  Family communication: discussed with daughter  [facility] hospitalists will continue to follow. Thank you.  Time Spent on Admission: 70 minutes  Kenzy Campoverde 12/14/2012, 12:28 AM

## 2012-12-15 ENCOUNTER — Encounter (HOSPITAL_COMMUNITY): Payer: Self-pay | Admitting: General Surgery

## 2012-12-15 ENCOUNTER — Inpatient Hospital Stay (HOSPITAL_COMMUNITY): Payer: Medicare Other

## 2012-12-15 DIAGNOSIS — N179 Acute kidney failure, unspecified: Secondary | ICD-10-CM | POA: Diagnosis present

## 2012-12-15 LAB — HEPATIC FUNCTION PANEL
Albumin: 2.1 g/dL — ABNORMAL LOW (ref 3.5–5.2)
Alkaline Phosphatase: 47 U/L (ref 39–117)
Bilirubin, Direct: <0.1 mg/dL (ref 0.0–0.3)
Total Bilirubin: 0.2 mg/dL — ABNORMAL LOW (ref 0.3–1.2)
Total Protein: 5.1 g/dL — ABNORMAL LOW (ref 6.0–8.3)

## 2012-12-15 LAB — BASIC METABOLIC PANEL
BUN: 43 mg/dL — ABNORMAL HIGH (ref 6–23)
CO2: 22 mEq/L (ref 19–32)
GFR calc Af Amer: 38 mL/min — ABNORMAL LOW (ref >90)
GFR calc non Af Amer: 32 mL/min — ABNORMAL LOW (ref >90)
Potassium: 4.4 mEq/L (ref 3.5–5.1)
Sodium: 135 mEq/L (ref 135–145)

## 2012-12-15 LAB — GLUCOSE, CAPILLARY
Glucose-Capillary: 138 mg/dL — ABNORMAL HIGH (ref 70–99)
Glucose-Capillary: 122 mg/dL — ABNORMAL HIGH (ref 70–99)
Glucose-Capillary: 132 mg/dL — ABNORMAL HIGH (ref 70–99)
Glucose-Capillary: 139 mg/dL — ABNORMAL HIGH (ref 70–99)
Glucose-Capillary: 128 mg/dL — ABNORMAL HIGH (ref 70–99)

## 2012-12-15 LAB — CBC
MCHC: 33.1 g/dL (ref 30.0–36.0)
MCV: 95.5 fL (ref 78.0–100.0)
Platelets: 174 10*3/uL (ref 150–400)
RDW: 13.9 % (ref 11.5–15.5)

## 2012-12-15 LAB — URINALYSIS, ROUTINE W REFLEX MICROSCOPIC
Bilirubin Urine: NEGATIVE
Nitrite: NEGATIVE
Specific Gravity, Urine: 1.035 — ABNORMAL HIGH (ref 1.005–1.030)
Urobilinogen, UA: 0.2 mg/dL (ref 0.0–1.0)

## 2012-12-15 LAB — URINE MICROSCOPIC-ADD ON

## 2012-12-15 MED ORDER — BISACODYL 10 MG RE SUPP
10.0000 mg | Freq: Two times a day (BID) | RECTAL | Status: AC
  Administered 2012-12-15 – 2012-12-16 (×3): 10 mg via RECTAL
  Filled 2012-12-15 (×3): qty 1

## 2012-12-15 NOTE — Progress Notes (Signed)
Patient and daughter spoke to me at length about patient wanting to be a DNR. This was brought up by the patient. The patient also mentioned this to Dr. [place] Pink during his rounds. Will address with attending physician Dr. Dalbert Batman.

## 2012-12-15 NOTE — Evaluation (Signed)
Physical Therapy Evaluation Patient Details Name: Norma Smith MRN: [id number] DOB: Jun 17, 1933 Today's Date: 12/15/2012 Time: 0831-0900 PT Time Calculation (min): 29 min  PT Assessment / Plan / Recommendation History of Present Illness  Chief Complaint: Severe, persistent lower abdominal pain. perforated viscous/peritonitis. s/p laparoscopic appendectomy on 12/13/12  Clinical Impression  Pt ambulated x 10' with RW, steady when standing. Pt will benefit from PT to address problems. Pt will benefit from OT consult. Pt lives alone. Recommend SNF.    PT Assessment  Patient needs continued PT services    Follow Up Recommendations  SNF    Does the patient have the potential to tolerate intense rehabilitation      Barriers to Discharge Decreased caregiver support      Equipment Recommendations  None recommended by PT    Recommendations for Other Services OT consult   Frequency Min 3X/week    Precautions / Restrictions Precautions Precautions: Fall Precaution Comments: abd. drain, sats drop on RA. Restrictions Weight Bearing Restrictions: No   Pertinent Vitals/Pain Back is 5, abd is 4\ Repositioned pillows in recliner- pain improved. Instructed to hold pillow against abd when coughing.      Mobility  Bed Mobility Bed Mobility: Rolling Right;Right Sidelying to Sit Rolling Right: 4: Min assist Right Sidelying to Sit: 3: Mod assist;With rails;HOB elevated Details for Bed Mobility Assistance: cues to roll to protect abdomen. Transfers Transfers: Sit to Stand;Stand to Sit Sit to Stand: From bed;3: Mod assist;From elevated surface;With upper extremity assist Stand to Sit: 4: Min assist;To chair/3-in-1;To bed;With upper extremity assist;With armrests Details for Transfer Assistance: cues for pushing up with UE's. reaching to armressts. Ambulation/Gait Ambulation/Gait Assistance: 1: +2 Total assist Ambulation/Gait: Patient Percentage: 70% Ambulation Distance (Feet): 10  Feet Assistive device: Rolling walker Ambulation/Gait Assistance Details: assist to get around bed, gait is steady at RW. Gait Pattern: Step-through pattern;Decreased stride length Gait velocity: slow    Exercises General Exercises - Lower Extremity Ankle Circles/Pumps: AROM;Both;10 reps Quad Sets: AROM;Both;10 reps   PT Diagnosis: Difficulty walking;Generalized weakness;Acute pain  PT Problem List: Decreased strength;Decreased range of motion;Decreased activity tolerance;Decreased mobility;Decreased knowledge of use of DME;Decreased safety awareness;Decreased knowledge of precautions;Pain PT Treatment Interventions: DME instruction;Gait training;Functional mobility training;Therapeutic activities;Therapeutic exercise;Patient/family education     PT Goals(Current goals can be found in the care plan section) Acute Rehab PT Goals Patient Stated Goal: pt agreed to walking. PT Goal Formulation: With patient Time For Goal Achievement: 12/29/12 Potential to Achieve Goals: Good  Visit Information  Last PT Received On: 12/15/12 Assistance Needed: +2 History of Present Illness: Chief Complaint: Severe, persistent lower abdominal pain. perforated viscous/peritonitis. s/p laparoscopic appendectomy on 12/13/12       Prior [place] expects to be discharged to:: Private residence [facility]) Living Arrangements: Alone Available Help at Discharge: [place] Type of Home: Apartment Home Access: Elevator Home Layout: One level Home Equipment: None Prior Function Level of Independence: Independent Communication Communication: No difficulties    Cognition  Cognition Arousal/Alertness: Awake/alert Behavior During Therapy: WFL for tasks assessed/performed Overall Cognitive Status: Within Functional Limits for tasks assessed    Extremity/Trunk Assessment Upper Extremity Assessment Upper Extremity Assessment: Generalized weakness Lower  Extremity Assessment Lower Extremity Assessment: Generalized weakness   Balance Balance Balance Assessed: Yes Static Sitting Balance Static Sitting - Balance Support: Bilateral upper extremity supported Static Sitting - Level of Assistance: 5: Stand by assistance Static Sitting - Comment/# of Minutes: sat x 5 min. Static Standing Balance Static Standing - Balance Support:  Bilateral upper extremity supported Static Standing - Level of Assistance: 4: Min assist Static Standing - Comment/# of Minutes: pt stood x 2 at RW x 2 minutes.  End of Session PT - End of Session Activity Tolerance: Patient tolerated treatment well Patient left: in chair;with call bell/phone within reach Nurse Communication: Mobility status  GP     Claretha Cooper 12/15/2012, 9:47 AM Tresa Endo PT [phone]

## 2012-12-15 NOTE — Progress Notes (Signed)
Patient ID: Norma Smith, female   DOB: 1933/03/29, 79 y.o.   MRN: [id number] 2 Days Post-Op  Subjective: Pt feels ok except sore throat.  No flatus yet.  Hasn't mobilized much since surgery.  Objective: Vital signs in last 24 hours: Temp:  [98.4 F (36.9 C)-100.4 F (38 C)] 99.4 F (37.4 C) (11/24 0400) Pulse Rate:  [87-97] 93 (11/24 0800) Resp:  [13-28] 24 (11/24 0800) BP: (94-132)/(30-48) 126/39 mmHg (11/24 0800) SpO2:  [92 %-95 %] 93 % (11/24 0800) Weight:  [160 lb 11.5 oz (72.9 kg)] 160 lb 11.5 oz (72.9 kg) (11/24 0600) Last BM Date: 12/12/12  Intake/Output from previous day: 11/23 0701 - 11/24 0700 In: 2350 [I.V.:2300; IV Piggyback:50] Out: 500 [Urine:425; Drains:75] Intake/Output this shift: Total I/O In: 100 [I.V.:100] Out: -   PE: Abd: soft, appropriately tender, incisions c/d/i, JP drain in place with serosang output.  NGT with clear output from ice chips, few BS. Heart: regular, loud murmur Lungs: CTAB  Lab Results:   Recent Labs  12/14/12 0345 12/15/12 0341  WBC 13.3* 13.0*  HGB 10.0* 8.4*  HCT 29.4* 25.4*  PLT 175 174   BMET  Recent Labs  12/14/12 0345 12/15/12 0341  NA 133* 135  K 3.8 4.4  CL 98 105  CO2 25 22  GLUCOSE 141* 151*  BUN 42* 43*  CREATININE 1.37* 1.50*  CALCIUM 8.5 7.6*   PT/INR No results found for this basename: LABPROT, INR,  in the last 72 hours CMP     Component Value Date/Time   NA 135 12/15/2012 0341   NA 142 12/01/2012 0949   K 4.4 12/15/2012 0341   K 4.3 12/01/2012 0949   CL 105 12/15/2012 0341   CL 105 12/14/2011 0851   CO2 22 12/15/2012 0341   CO2 28 12/01/2012 0949   GLUCOSE 151* 12/15/2012 0341   GLUCOSE 103 12/01/2012 0949   GLUCOSE 100* 12/14/2011 0851   BUN 43* 12/15/2012 0341   BUN 20.5 12/01/2012 0949   CREATININE 1.50* 12/15/2012 0341   CREATININE 1.1 12/01/2012 0949   CALCIUM 7.6* 12/15/2012 0341   CALCIUM 10.1 12/01/2012 0949   PROT 5.1* 12/15/2012 0341   PROT 6.9 12/01/2012 0949   ALBUMIN  2.1* 12/15/2012 0341   ALBUMIN 3.7 12/01/2012 0949   AST 21 12/15/2012 0341   AST 22 12/01/2012 0949   ALT 9 12/15/2012 0341   ALT 11 12/01/2012 0949   ALKPHOS 47 12/15/2012 0341   ALKPHOS 50 12/01/2012 0949   BILITOT 0.2* 12/15/2012 0341   BILITOT 0.53 12/01/2012 0949   GFRNONAA 32* 12/15/2012 0341   GFRAA 38* 12/15/2012 0341   Lipase     Component Value Date/Time   LIPASE 12 12/13/2012 1835       Studies/Results: Ct Abdomen Pelvis W Contrast  12/13/2012   CLINICAL DATA:  Abdomen pain  EXAM: CT ABDOMEN AND PELVIS WITH CONTRAST  TECHNIQUE: Multidetector CT imaging of the abdomen and pelvis was performed using the standard protocol following bolus administration of intravenous contrast.  CONTRAST:  146mL OMNIPAQUE IOHEXOL 300 MG/ML  SOLN  COMPARISON:  None.  FINDINGS: The appendix is dilated partial air-filled with stranding around the appendix. There is free air and free fluid in the right lower quadrant and free fluid in the pelvis. Inflammation surrounding ileal small bowel loops with bowel wall thickening. There are dilated more proximal small bowel loops consistent with ileus.  The liver, spleen are normal. There is small calcified granuloma within the spleen. Small amount  of ascites is noted around the liver and spleen. The gallbladder, pancreas, adrenal glands are normal. There are small cysts in bilateral kidneys, largest in the posterior midpole left kidney measuring 1.2 cm. There is no hydronephrosis bilaterally. There is atherosclerosis of the abdominal aorta without aneurysmal dilatation.  Decompressed bladder limits evaluation. The uterus is normal for the patient's age. Minimal dependent atelectasis of the posterior right lung base. No acute abnormalities identified within the visualized bones.  IMPRESSION: Findings consistent with perforated bowel in the right lower quadrant with free air and free fluid in the right lower quadrant probably either arising from the appendix or  distal ileal small bowel loops. The appendix is dilated and enlarged. There are also inflamed thick wall ileal small bowel loops.  I discussed the results with Dr. Tanna Furry on December 13, 2012 at 8:45 p.m.   Electronically Signed   By: Abelardo Diesel M.D.   On: 12/13/2012 20:49   Dg Chest Port 1 View  12/13/2012   CLINICAL DATA:  Preop, no chest pain  EXAM: PORTABLE CHEST - 1 VIEW  COMPARISON:  None  FINDINGS: Mild to moderate cardiac enlargement. Aortic arch calcification. Lungs clear.  IMPRESSION: Cardiac enlargement.  No acute findings.   Electronically Signed   By: Skipper Cliche M.D.   On: 12/13/2012 21:44    Anti-infectives: Anti-infectives   Start     Dose/Rate Route Frequency Ordered Stop   12/14/12 2100  ertapenem [facility]) 1 g in sodium chloride 0.9 % 50 mL IVPB     1 g 100 mL/hr over 30 Minutes Intravenous Every 24 hours 12/14/12 0148     12/13/12 2130  [MAR Hold]  ertapenem (INVANZ) 1 g in sodium chloride 0.9 % 50 mL IVPB     (On MAR Hold since 12/13/12 2152)  Comments:  stat   1 g 100 mL/hr over 30 Minutes Intravenous  Once 12/13/12 2119 12/13/12 2208       Assessment/Plan  1. POD 2, s/p lap appy for perforated appendicitis 2.  HTN 3. DM 4. Aortic stenosis, moderate 5. CLL 6. ARI  Plan: 1. Appreciate medicine's assistance with this patient 2.  creatinine up some today.  Dr. Dalbert Batman has sent off some urine studies.  Will leave foley in for now 3. Ileus seems to be resolving.  Will clamp NGT and remove in 6 hours if no nausea or vomiting.  Cont NPO once NGT removed 4. PT/OT eval and treat 5. Daily dressing to JP drain site 6. Labs in AM 7. Invanz D: 2/7    LOS: 2 days    Osmany Azer E 12/15/2012, 8:14 AM Pager: [phone]

## 2012-12-15 NOTE — Consult Note (Signed)
HPI: I was asked by Dr. [place] Pink to see Norma Smith who is a 79 y.o. female admitted Nov 22 with a perforated appendix and underwent laparoscopic appendectomy on 11/22.  She has a history of longstanding hypertension on meds  including an ARB.  On Nov 10 at a routine office visit BP was 158/53.  During the hospitalization BPs have been soft. EDP BP was recorded as 110/68.  Nadir BP was 94/48 on 11/24. SCreat on 11/10 was 1.1, on admission 1.4 and today 1.5mg /dl.  UOP has been poor with 4125 cc out yesterday. And positive 3.8 liters this hospitalization. She denies dyspnea but reports inability to take deep breaths.  Past Medical History  Diagnosis Date  . Skin cancer   . Iron deficiency 12/01/2010  . Allergy   . Diabetes mellitus   . Hyperlipidemia   . Hypertension   . Anemia   . Osteopenia   . Dry skin dermatitis 12/01/2010  . CLL (chronic lymphoblastic leukemia) 12/01/2010  . Aortic stenosis, moderate 06/16/2011   Past Surgical History  Procedure Laterality Date  . Breast biopsy  1976  . Cataract extraction    . Laparoscopic appendectomy N/A 12/13/2012    Procedure: APPENDECTOMY LAPAROSCOPIC;  Surgeon: Odis Hollingshead, MD;  Location: [facility];  Service: General;  Laterality: N/A;   Social History:  reports that she has quit smoking. She does not have any smokeless tobacco history on file. She reports that she drinks alcohol. She reports that she does not use illicit drugs. Allergies:  Allergies  Allergen Reactions  . Codeine Other (See Comments)    Passed out  . Penicillins Rash   Family History  Problem Relation Age of Onset  . Diabetes Other   . Cancer Other     breast cancer  . Heart disease Other   . Diabetes Other     Medications:  Scheduled: . bisacodyl  10 mg Rectal BID  . enoxaparin (LOVENOX) injection  40 mg Subcutaneous Q24H  . ertapenem (INVANZ) IV  1 g Intravenous Q24H  . insulin aspart  0-15 Units Subcutaneous Q4H  . pantoprazole (PROTONIX) IV  40 mg  Intravenous Daily    ROS: as peer HPI otherwise essentially non contributory  Blood pressure 131/51, pulse 88, temperature 98.1 F (36.7 C), temperature source Oral, resp. rate 26, height 5' (1.524 m), weight 72.9 kg (160 lb 11.5 oz), SpO2 95.00%.  General appearance: alert and slowed mentation Head: Normocephalic, without obvious abnormality, atraumatic Eyes: negative Nose: Nares normal. Septum midline. Mucosa normal. No drainage or sinus tenderness. Throat: lips, mucosa, and tongue normal; teeth and gums normal Resp: clear to auscultation bilaterally Chest wall: no tenderness Cardio: regular rate and rhythm and systolic murmur: systolic ejection 3/6, crescendo and decrescendo at lower left sternal border, radiating to aortic region GI: post op withbandages Extremities: edema 1+-2+ Skin: Skin color, texture, turgor normal. No rashes or lesions Neurologic: Grossly normal  Results for orders placed during the hospital encounter of 12/13/12 (from the past 48 hour(s))  CBC WITH DIFFERENTIAL     Status: Abnormal   Collection Time    12/13/12  6:35 PM      Result Value Range   WBC 20.4 (*) 4.0 - 10.5 K/uL   RBC 3.24 (*) 3.87 - 5.11 MIL/uL   Hemoglobin 10.5 (*) 12.0 - 15.0 g/dL   HCT 31.0 (*) 36.0 - 46.0 %   MCV 95.7  78.0 - 100.0 fL   MCH 32.4  26.0 - 34.0 pg  MCHC 33.9  30.0 - 36.0 g/dL   RDW 13.9  11.5 - 15.5 %   Platelets 216  150 - 400 K/uL   Neutrophils Relative % 71  43 - 77 %   Lymphocytes Relative 22  12 - 46 %   Monocytes Relative 7  3 - 12 %   Eosinophils Relative 0  0 - 5 %   Basophils Relative 0  0 - 1 %   Neutro Abs 14.5 (*) 1.7 - 7.7 K/uL   Lymphs Abs 4.5 (*) 0.7 - 4.0 K/uL   Monocytes Absolute 1.4 (*) 0.1 - 1.0 K/uL   Eosinophils Absolute 0.0  0.0 - 0.7 K/uL   Basophils Absolute 0.0  0.0 - 0.1 K/uL   WBC Morphology INCREASED BANDS (>20% BANDS)     Comment: MILD LEFT SHIFT (1-5% METAS, OCC MYELO, OCC BANDS)  COMPREHENSIVE METABOLIC PANEL     Status: Abnormal    Collection Time    12/13/12  6:35 PM      Result Value Range   Sodium 136  135 - 145 mEq/L   Potassium 3.5  3.5 - 5.1 mEq/L   Chloride 97  96 - 112 mEq/L   CO2 26  19 - 32 mEq/L   Glucose, Bld 153 (*) 70 - 99 mg/dL   BUN 41 (*) 6 - 23 mg/dL   Creatinine, Ser 1.40 (*) 0.50 - 1.10 mg/dL   Calcium 9.3  8.4 - 10.5 mg/dL   Total Protein 6.8  6.0 - 8.3 g/dL   Albumin 3.3 (*) 3.5 - 5.2 g/dL   AST 22  0 - 37 U/L   ALT 10  0 - 35 U/L   Alkaline Phosphatase 45  39 - 117 U/L   Total Bilirubin 0.7  0.3 - 1.2 mg/dL   GFR calc non Af Amer 35 (*) >90 mL/min   GFR calc Af Amer 41 (*) >90 mL/min   Comment: (NOTE)     The eGFR has been calculated using the CKD EPI equation.     This calculation has not been validated in all clinical situations.     eGFR's persistently <90 mL/min signify possible Chronic Kidney     Disease.  LIPASE, BLOOD     Status: None   Collection Time    12/13/12  6:35 PM      Result Value Range   Lipase 12  11 - 59 U/L  URINALYSIS, ROUTINE W REFLEX MICROSCOPIC     Status: Abnormal   Collection Time    12/13/12  7:39 PM      Result Value Range   Color, Urine AMBER (*) YELLOW   Comment: BIOCHEMICALS MAY BE AFFECTED BY COLOR   APPearance TURBID (*) CLEAR   Specific Gravity, Urine 1.031 (*) 1.005 - 1.030   pH 5.5  5.0 - 8.0   Glucose, UA NEGATIVE  NEGATIVE mg/dL   Hgb urine dipstick LARGE (*) NEGATIVE   Bilirubin Urine MODERATE (*) NEGATIVE   Ketones, ur NEGATIVE  NEGATIVE mg/dL   Protein, ur 100 (*) NEGATIVE mg/dL   Urobilinogen, UA 0.2  0.0 - 1.0 mg/dL   Nitrite NEGATIVE  NEGATIVE   Leukocytes, UA SMALL (*) NEGATIVE  URINE CULTURE     Status: None   Collection Time    12/13/12  7:39 PM      Result Value Range   Specimen Description URINE, CLEAN CATCH     Special Requests NONE     Culture  Setup Time  Value: 12/14/2012 03:28     Performed at [facility] PENDING     Culture       Value: Culture reincubated for better growth      Performed at [facility]   Report Status PENDING    URINE MICROSCOPIC-ADD ON     Status: Abnormal   Collection Time    12/13/12  7:39 PM      Result Value Range   Squamous Epithelial / LPF RARE  RARE   WBC, UA 11-20  <3 WBC/hpf   RBC / HPF TOO NUMEROUS TO COUNT  <3 RBC/hpf   Bacteria, UA FEW (*) RARE   Urine-Other AMORPHOUS URATES/PHOSPHATES    TYPE AND SCREEN     Status: None   Collection Time    12/13/12 10:30 PM      Result Value Range   ABO/RH(D) A NEG     Antibody Screen NEG     Sample Expiration 12/16/2012    ABO/RH     Status: None   Collection Time    12/13/12 10:30 PM      Result Value Range   ABO/RH(D) A NEG    GLUCOSE, CAPILLARY     Status: Abnormal   Collection Time    12/13/12 11:57 PM      Result Value Range   Glucose-Capillary 107 (*) 70 - 99 mg/dL  MRSA PCR SCREENING     Status: None   Collection Time    12/14/12  1:30 AM      Result Value Range   MRSA by PCR NEGATIVE  NEGATIVE   Comment:            The GeneXpert MRSA Assay (FDA     approved for NASAL specimens     only), is one component of a     comprehensive MRSA colonization     surveillance program. It is not     intended to diagnose MRSA     infection nor to guide or     monitor treatment for     MRSA infections.  CBC     Status: Abnormal   Collection Time    12/14/12  3:45 AM      Result Value Range   WBC 13.3 (*) 4.0 - 10.5 K/uL   RBC 3.14 (*) 3.87 - 5.11 MIL/uL   Hemoglobin 10.0 (*) 12.0 - 15.0 g/dL   HCT 29.4 (*) 36.0 - 46.0 %   MCV 93.6  78.0 - 100.0 fL   MCH 31.8  26.0 - 34.0 pg   MCHC 34.0  30.0 - 36.0 g/dL   RDW 14.0  11.5 - 15.5 %   Platelets 175  150 - 400 K/uL   Comment: REPEATED TO VERIFY  BASIC METABOLIC PANEL     Status: Abnormal   Collection Time    12/14/12  3:45 AM      Result Value Range   Sodium 133 (*) 135 - 145 mEq/L   Potassium 3.8  3.5 - 5.1 mEq/L   Chloride 98  96 - 112 mEq/L   CO2 25  19 - 32 mEq/L   Glucose, Bld 141 (*) 70 - 99 mg/dL   BUN 42  (*) 6 - 23 mg/dL   Creatinine, Ser 1.37 (*) 0.50 - 1.10 mg/dL   Calcium 8.5  8.4 - 10.5 mg/dL   GFR calc non Af Amer 36 (*) >90 mL/min   GFR calc Af Amer 42 (*) >90 mL/min  Comment: (NOTE)     The eGFR has been calculated using the CKD EPI equation.     This calculation has not been validated in all clinical situations.     eGFR's persistently <90 mL/min signify possible Chronic Kidney     Disease.  GLUCOSE, CAPILLARY     Status: Abnormal   Collection Time    12/14/12  4:04 AM      Result Value Range   Glucose-Capillary 134 (*) 70 - 99 mg/dL  GLUCOSE, CAPILLARY     Status: Abnormal   Collection Time    12/14/12  8:00 AM      Result Value Range   Glucose-Capillary 160 (*) 70 - 99 mg/dL   Comment 1 Documented in Chart     Comment 2 Notify RN    GLUCOSE, CAPILLARY     Status: Abnormal   Collection Time    12/14/12 11:43 AM      Result Value Range   Glucose-Capillary 124 (*) 70 - 99 mg/dL   Comment 1 Documented in Chart     Comment 2 Notify RN    GLUCOSE, CAPILLARY     Status: Abnormal   Collection Time    12/14/12  4:15 PM      Result Value Range   Glucose-Capillary 139 (*) 70 - 99 mg/dL  GLUCOSE, CAPILLARY     Status: None   Collection Time    12/14/12  7:22 PM      Result Value Range   Glucose-Capillary 94  70 - 99 mg/dL   Comment 1 Documented in Chart     Comment 2 Notify RN    GLUCOSE, CAPILLARY     Status: Abnormal   Collection Time    12/15/12  3:38 AM      Result Value Range   Glucose-Capillary 138 (*) 70 - 99 mg/dL  CBC     Status: Abnormal   Collection Time    12/15/12  3:41 AM      Result Value Range   WBC 13.0 (*) 4.0 - 10.5 K/uL   RBC 2.66 (*) 3.87 - 5.11 MIL/uL   Hemoglobin 8.4 (*) 12.0 - 15.0 g/dL   HCT 25.4 (*) 36.0 - 46.0 %   MCV 95.5  78.0 - 100.0 fL   MCH 31.6  26.0 - 34.0 pg   MCHC 33.1  30.0 - 36.0 g/dL   RDW 13.9  11.5 - 15.5 %   Platelets 174  150 - 400 K/uL  BASIC METABOLIC PANEL     Status: Abnormal   Collection Time    12/15/12  3:41  AM      Result Value Range   Sodium 135  135 - 145 mEq/L   Potassium 4.4  3.5 - 5.1 mEq/L   Chloride 105  96 - 112 mEq/L   CO2 22  19 - 32 mEq/L   Glucose, Bld 151 (*) 70 - 99 mg/dL   BUN 43 (*) 6 - 23 mg/dL   Creatinine, Ser 1.50 (*) 0.50 - 1.10 mg/dL   Calcium 7.6 (*) 8.4 - 10.5 mg/dL   GFR calc non Af Amer 32 (*) >90 mL/min   GFR calc Af Amer 38 (*) >90 mL/min   Comment: (NOTE)     The eGFR has been calculated using the CKD EPI equation.     This calculation has not been validated in all clinical situations.     eGFR's persistently <90 mL/min signify possible Chronic Kidney  Disease.  HEPATIC FUNCTION PANEL     Status: Abnormal   Collection Time    12/15/12  3:41 AM      Result Value Range   Total Protein 5.1 (*) 6.0 - 8.3 g/dL   Albumin 2.1 (*) 3.5 - 5.2 g/dL   AST 21  0 - 37 U/L   ALT 9  0 - 35 U/L   Alkaline Phosphatase 47  39 - 117 U/L   Total Bilirubin 0.2 (*) 0.3 - 1.2 mg/dL   Bilirubin, Direct <0.1  0.0 - 0.3 mg/dL   Indirect Bilirubin NOT CALCULATED  0.3 - 0.9 mg/dL  GLUCOSE, CAPILLARY     Status: Abnormal   Collection Time    12/15/12  7:31 AM      Result Value Range   Glucose-Capillary 122 (*) 70 - 99 mg/dL   Comment 1 Documented in Chart     Comment 2 Notify RN    URINALYSIS, ROUTINE W REFLEX MICROSCOPIC     Status: Abnormal   Collection Time    12/15/12  8:00 AM      Result Value Range   Color, Urine YELLOW  YELLOW   APPearance CLOUDY (*) CLEAR   Specific Gravity, Urine 1.035 (*) 1.005 - 1.030   pH 5.5  5.0 - 8.0   Glucose, UA NEGATIVE  NEGATIVE mg/dL   Hgb urine dipstick LARGE (*) NEGATIVE   Bilirubin Urine NEGATIVE  NEGATIVE   Ketones, ur NEGATIVE  NEGATIVE mg/dL   Protein, ur 100 (*) NEGATIVE mg/dL   Urobilinogen, UA 0.2  0.0 - 1.0 mg/dL   Nitrite NEGATIVE  NEGATIVE   Leukocytes, UA TRACE (*) NEGATIVE  SODIUM, URINE, RANDOM     Status: None   Collection Time    12/15/12  8:00 AM      Result Value Range   Sodium, Ur <10     Comment:  Performed at [facility]     Status: Abnormal   Collection Time    12/15/12  8:00 AM      Result Value Range   Squamous Epithelial / LPF RARE  RARE   WBC, UA 0-2  <3 WBC/hpf   RBC / HPF 3-6  <3 RBC/hpf   Bacteria, UA FEW (*) RARE   Urine-Other FEW YEAST    GLUCOSE, CAPILLARY     Status: Abnormal   Collection Time    12/15/12 11:28 AM      Result Value Range   Glucose-Capillary 132 (*) 70 - 99 mg/dL   Comment 1 Documented in Chart     Comment 2 Notify RN     Ct Abdomen Pelvis W Contrast  12/13/2012   CLINICAL DATA:  Abdomen pain  EXAM: CT ABDOMEN AND PELVIS WITH CONTRAST  TECHNIQUE: Multidetector CT imaging of the abdomen and pelvis was performed using the standard protocol following bolus administration of intravenous contrast.  CONTRAST:  144mL OMNIPAQUE IOHEXOL 300 MG/ML  SOLN  COMPARISON:  None.  FINDINGS: The appendix is dilated partial air-filled with stranding around the appendix. There is free air and free fluid in the right lower quadrant and free fluid in the pelvis. Inflammation surrounding ileal small bowel loops with bowel wall thickening. There are dilated more proximal small bowel loops consistent with ileus.  The liver, spleen are normal. There is small calcified granuloma within the spleen. Small amount of ascites is noted around the liver and spleen. The gallbladder, pancreas, adrenal glands are normal. There are small  cysts in bilateral kidneys, largest in the posterior midpole left kidney measuring 1.2 cm. There is no hydronephrosis bilaterally. There is atherosclerosis of the abdominal aorta without aneurysmal dilatation.  Decompressed bladder limits evaluation. The uterus is normal for the patient's age. Minimal dependent atelectasis of the posterior right lung base. No acute abnormalities identified within the visualized bones.  IMPRESSION: Findings consistent with perforated bowel in the right lower quadrant with free air and free fluid in  the right lower quadrant probably either arising from the appendix or distal ileal small bowel loops. The appendix is dilated and enlarged. There are also inflamed thick wall ileal small bowel loops.  I discussed the results with Dr. Tanna Furry on December 13, 2012 at 8:45 p.m.   Electronically Signed   By: Abelardo Diesel M.D.   On: 12/13/2012 20:49   Dg Chest Port 1 View  12/15/2012   CLINICAL DATA:  Shortness of breath, status post appendectomy  EXAM: PORTABLE CHEST - 1 VIEW  COMPARISON:  12/13/2012  FINDINGS: NG tube within the proximal stomach. Heart is enlarged with new developing pleural effusions and basilar atelectasis/consolidation. Upper lobes remain clear. No pneumothorax. Atherosclerosis of the aorta.  IMPRESSION: Increased basilar atelectasis/ consolidation with developing pleural effusions.  Cardiomegaly   Electronically Signed   By: Daryll Brod M.D.   On: 12/15/2012 09:33   Dg Chest Port 1 View  12/13/2012   CLINICAL DATA:  Preop, no chest pain  EXAM: PORTABLE CHEST - 1 VIEW  COMPARISON:  None  FINDINGS: Mild to moderate cardiac enlargement. Aortic arch calcification. Lungs clear.  IMPRESSION: Cardiac enlargement.  No acute findings.   Electronically Signed   By: Skipper Cliche M.D.   On: 12/13/2012 21:44    Assessment:  1 Oliguric Acute Kidney Injury, hemodynamically mediated due to relative hypotension, ARB therapy and SIRS (s/p appendectomy w/rupture) 2  Aortic Stenosis 3  Volume overload/Pleural effusion 4  Hx of CLL Plan: 1 Careful volume repletion as you are doing, but given CXR findings worry about early CHF not imaged  Braydin Aloi C 12/15/2012, 1:57 PM

## 2012-12-15 NOTE — Progress Notes (Addendum)
[facility] PROGRESS NOTE  Norma Smith [id number] DOB: 15-May-1933 DOA: 12/13/2012  PCP: Cathlean Cower, MD  Brief HPI: 79 year old female with history of hypertension, moderate AS with HOCM, ( EF of 65-70% as per echo in June 2014, follows with Dr. Stanford Breed), diabetes mellitus on oral hypoglycemics, hypertension, iron deficiency anemia, CLL (currently stable and follows with Dr Juliann Mule) presented to the hospital with diffuse abdominal pain. She was found to have perforated appendicitis and was taken to the OR. Medicine was consulted for assistance with medical problems.   Past medical history:  Past Medical History  Diagnosis Date  . Skin cancer   . Iron deficiency 12/01/2010  . Allergy   . Diabetes mellitus   . Hyperlipidemia   . Hypertension   . Anemia   . Osteopenia   . Dry skin dermatitis 12/01/2010  . CLL (chronic lymphoblastic leukemia) 12/01/2010  . Aortic stenosis, moderate 06/16/2011    Primary Service: General Surgery  Procedures: Appendectomy  Antibiotics: Ertapenem 11/22  Subjective: Patient continues to have abdominal pain and throat pain. Denies any shortness of breath at rest.   Objective: Vital Signs  Filed Vitals:   12/15/12 0400 12/15/12 0600 12/15/12 0740 12/15/12 0800  BP: 107/32 94/48  126/39  Pulse: 88 87  93  Temp: 99.4 F (37.4 C)  98.4 F (36.9 C)   TempSrc:   Oral   Resp: 21 13  24   Height:      Weight:  72.9 kg (160 lb 11.5 oz)    SpO2: 92% 93%  93%    Filed Weights   12/14/12 0200 12/15/12 0600  Weight: 71 kg (156 lb 8.4 oz) 72.9 kg (160 lb 11.5 oz)    Intake/Output from previous day: 11/23 0701 - 11/24 0700 In: 2350 [I.V.:2300; IV Piggyback:50] Out: 500 [Urine:425; Drains:75]  General appearance: alert, cooperative, appears stated age and no distress Resp: Diminished air entry at bases. Few crackles. Cardio: regular rate and rhythm, S1, S2 normal. Loud systolic murmur over precordium. No click, rub or gallop GI: Covered in  dressing with a drain. BS heard.  Extremities: extremities normal, atraumatic, no cyanosis or edema Pulses: 2+ and symmetric Skin: Skin color, texture, turgor normal. No rashes or lesions Neurologic: Alert and oriented x 2. No focal deficits  Lab Results:  Basic Metabolic Panel:  Recent Labs Lab 12/13/12 1835 12/14/12 0345 12/15/12 0341  NA 136 133* 135  K 3.5 3.8 4.4  CL 97 98 105  CO2 26 25 22   GLUCOSE 153* 141* 151*  BUN 41* 42* 43*  CREATININE 1.40* 1.37* 1.50*  CALCIUM 9.3 8.5 7.6*   Liver Function Tests:  Recent Labs Lab 12/13/12 1835 12/15/12 0341  AST 22 21  ALT 10 9  ALKPHOS 45 47  BILITOT 0.7 0.2*  PROT 6.8 5.1*  ALBUMIN 3.3* 2.1*    Recent Labs Lab 12/13/12 1835  LIPASE 12   CBC:  Recent Labs Lab 12/13/12 1835 12/14/12 0345 12/15/12 0341  WBC 20.4* 13.3* 13.0*  NEUTROABS 14.5*  --   --   HGB 10.5* 10.0* 8.4*  HCT 31.0* 29.4* 25.4*  MCV 95.7 93.6 95.5  PLT 216 175 174   CBG:  Recent Labs Lab 12/14/12 1143 12/14/12 1615 12/14/12 1922 12/15/12 0338 12/15/12 0731  GLUCAP 124* 139* 94 138* 122*    Recent Results (from the past 240 hour(s))  URINE CULTURE     Status: None   Collection Time    12/13/12  7:39 PM  Result Value Range Status   Specimen Description URINE, CLEAN CATCH   Final   Special Requests NONE   Final   Culture  Setup Time     Final   Value: 12/14/2012 03:28     Performed at [place] PENDING   Incomplete   Culture     Final   Value: Culture reincubated for better growth     Performed at [facility]   Report Status PENDING   Incomplete  MRSA PCR SCREENING     Status: None   Collection Time    12/14/12  1:30 AM      Result Value Range Status   MRSA by PCR NEGATIVE  NEGATIVE Final   Comment:            The GeneXpert MRSA Assay (FDA     approved for NASAL specimens     only), is one component of a     comprehensive MRSA colonization     surveillance program. It is not      intended to diagnose MRSA     infection nor to guide or     monitor treatment for     MRSA infections.      Studies/Results: Ct Abdomen Pelvis W Contrast  12/13/2012   CLINICAL DATA:  Abdomen pain  EXAM: CT ABDOMEN AND PELVIS WITH CONTRAST  TECHNIQUE: Multidetector CT imaging of the abdomen and pelvis was performed using the standard protocol following bolus administration of intravenous contrast.  CONTRAST:  176mL OMNIPAQUE IOHEXOL 300 MG/ML  SOLN  COMPARISON:  None.  FINDINGS: The appendix is dilated partial air-filled with stranding around the appendix. There is free air and free fluid in the right lower quadrant and free fluid in the pelvis. Inflammation surrounding ileal small bowel loops with bowel wall thickening. There are dilated more proximal small bowel loops consistent with ileus.  The liver, spleen are normal. There is small calcified granuloma within the spleen. Small amount of ascites is noted around the liver and spleen. The gallbladder, pancreas, adrenal glands are normal. There are small cysts in bilateral kidneys, largest in the posterior midpole left kidney measuring 1.2 cm. There is no hydronephrosis bilaterally. There is atherosclerosis of the abdominal aorta without aneurysmal dilatation.  Decompressed bladder limits evaluation. The uterus is normal for the patient's age. Minimal dependent atelectasis of the posterior right lung base. No acute abnormalities identified within the visualized bones.  IMPRESSION: Findings consistent with perforated bowel in the right lower quadrant with free air and free fluid in the right lower quadrant probably either arising from the appendix or distal ileal small bowel loops. The appendix is dilated and enlarged. There are also inflamed thick wall ileal small bowel loops.  I discussed the results with Dr. Tanna Furry on December 13, 2012 at 8:45 p.m.   Electronically Signed   By: Abelardo Diesel M.D.   On: 12/13/2012 20:49   Dg Chest Port 1  View  12/13/2012   CLINICAL DATA:  Preop, no chest pain  EXAM: PORTABLE CHEST - 1 VIEW  COMPARISON:  None  FINDINGS: Mild to moderate cardiac enlargement. Aortic arch calcification. Lungs clear.  IMPRESSION: Cardiac enlargement.  No acute findings.   Electronically Signed   By: Skipper Cliche M.D.   On: 12/13/2012 21:44    Medications:  Scheduled: . enoxaparin (LOVENOX) injection  40 mg Subcutaneous Q24H  . ertapenem (INVANZ) IV  1 g Intravenous Q24H  . insulin aspart  0-15 Units Subcutaneous Q4H  . pantoprazole (PROTONIX) IV  40 mg Intravenous Daily   Continuous: . dextrose 5 % and 0.45 % NaCl with KCl 20 mEq/L 100 mL/hr at 12/15/12 0758   [id number], morphine injection, ondansetron (ZOFRAN) IV, ondansetron, phenol  Assessment/Plan:  Principal Problem:   Acute fulminating appendicitis with perforation and peritonitis Active Problems:   Hypertension   CLL (chronic lymphocytic leukemia)   Aortic stenosis, moderate   Hypertrophic obstructive cardiomyopathy(425.11)    Perforated acute appendicitis with peritonitis  Status post laparoscopic appendectomy. Management per surgery. Incentive spirometry.   Acute kidney injury  Patient with inadequate urine output. Is in positive fluid balance. Considering her history of AS with HOCM will go ahead and consult Nephrology to assist with management. Continue IV fluids for now till we get CXR. May need diuresis.   Aortic stenosis and HOCM  Last echo 5 months back with EF of 65-70% with HOCM. Being followed by Dr Stanford Breed. Patient is on metoprolol and amlodipine at home. Continue PRn metoprolol IV.  Resume oral medications as soon as able. Avoid afterload reducing agents like hydralazine. Aspirin on hold. Consult cardiology if cardiac status changes. Assess volume status closely. Watch for volume overload. See above.  Diabetes mellitus  Patient on different oral hypoglycemics. Last A1c from May of 4.7. Blood glucose currently stable.  Will be maintained on sliding scale insulin. Patient remains n.p.o. for now.   Hypertension  Blood pressure is currently stable. Continue when necessary IV Lopressor.   Iron deficiency anemia  Hemoglobin currently stable. Iron sulfate on hold.   CLL  Currently stable and not on any treatment. Followed in oncology as outpatient.   Hyperlipidemia  Lipitor on hold.   Code Status: Full code  DVT Prophylaxis: SCD's    Family Communication: Discussed with patient. Patient alluded that she may not want aggressive management. I have asked her to talk to her daughter about this. I will be happy to discuss this with her daughter as well. But it is likely patient is exhausted due to recent extensive surgery and the pain. This may be a temporary feeling. Will readdress. Disposition Plan: Will likely stay in stepdown for now. Will need telemetry if transferred.     LOS: 2 days   [facility] Pager [phone] 12/15/2012, 8:49 AM  If 8PM-8AM, please contact night-coverage at [email or web address], password [email or web address]   Spoke to daughter about my conversation with her mother earlier today. Daughter stated that patient had wanted to be DNR but most likely was not ordered due to need for surgery. Will let patient's primary attending address this issue.  Demitrius Crass 12:12 PM

## 2012-12-15 NOTE — Progress Notes (Signed)
General surgery attending note:  I personally interviewed and examined this patient this morning. I have discussed the care plan with her daughter.  She is stable, alert, sitting up in a chair. Her abdomen is soft but tender and bowel sounds are absent, consistent with ileus.  From a surgical standpoint, we will need to leave her NG tube in for now and leave the JP drain in for now and she will need to continue to be n.p.o., on Abx.. [facility]ist are actively involved due to multiple medical problems  (AS w/ HOCM, DM, HTN, CLL, AKI, ) Because of her deteriorating renal function and associated CHF, nephrology has been consulted. I think that is very appropriate.  We will leave her Foley catheter in for now until her renal status is stabilized.   Edsel Petrin. Dalbert Batman, M.D., [facility] General and Minimally invasive Surgery Breast and Colorectal Surgery Office:   [phone] Pager:   [phone]

## 2012-12-16 LAB — URINE CULTURE

## 2012-12-16 LAB — CBC
HCT: 27 % — ABNORMAL LOW (ref 36.0–46.0)
MCHC: 33 g/dL (ref 30.0–36.0)
Platelets: 213 10*3/uL (ref 150–400)
RBC: 2.8 MIL/uL — ABNORMAL LOW (ref 3.87–5.11)
RDW: 13.8 % (ref 11.5–15.5)
WBC: 14.8 10*3/uL — ABNORMAL HIGH (ref 4.0–10.5)

## 2012-12-16 LAB — GLUCOSE, CAPILLARY
Glucose-Capillary: 126 mg/dL — ABNORMAL HIGH (ref 70–99)
Glucose-Capillary: 119 mg/dL — ABNORMAL HIGH (ref 70–99)
Glucose-Capillary: 105 mg/dL — ABNORMAL HIGH (ref 70–99)
Glucose-Capillary: 111 mg/dL — ABNORMAL HIGH (ref 70–99)

## 2012-12-16 LAB — BASIC METABOLIC PANEL
Chloride: 105 mEq/L (ref 96–112)
Creatinine, Ser: 1.12 mg/dL — ABNORMAL HIGH (ref 0.50–1.10)
GFR calc Af Amer: 53 mL/min — ABNORMAL LOW (ref >90)
GFR calc non Af Amer: 46 mL/min — ABNORMAL LOW (ref >90)
Potassium: 4.1 mEq/L (ref 3.5–5.1)

## 2012-12-16 NOTE — Progress Notes (Signed)
Palliative Medicine team consult for goals of care received; patient seen at bedside; alert, oriented stated she wanted to speak with PMT and would like 'at least two of her daughters' to be present for discussion; Probation officer stated her daughter from [place] will be in town on Thursday; her daughter from [place] may also be coming but she is not sure- patient requested Probation officer contact her daughter [name] who is here in [place] [phone]) and requests meeting time for Thursday -writercalled and left voice message - await call back from daughter to schedule PMT meeting   Danton Sewer, RN 12/16/2012, 3:57 PM Palliative Medicine Team RN Liaison [phone]

## 2012-12-16 NOTE — Progress Notes (Signed)
Physical Therapy Treatment Patient Details Name: Carmelle Mueth MRN: [id number] DOB: Jan 30, 1933 Today's Date: 12/16/2012 Time: [id number] PT Time Calculation (min): 27 min  PT Assessment / Plan / Recommendation  History of Present Illness Chief Complaint: Severe, persistent lower abdominal pain. perforated viscous/peritonitis. s/p laparoscopic appendectomy on 12/13/12   PT Comments   Pt did mobilize but did not really desire to do so. Pt ambulated x 10 x 2. Pt does well with mobility. Recommend SNF  Follow Up Recommendations  SNF     Does the patient have the potential to tolerate intense rehabilitation     Barriers to Discharge        Equipment Recommendations  None recommended by PT    Recommendations for Other Services    Frequency Min 3X/week   Progress towards PT Goals Progress towards PT goals: Progressing toward goals  Plan Current plan remains appropriate    Precautions / Restrictions Precautions Precautions: Fall Precaution Comments: abd. drain,    Pertinent Vitals/Pain Reports pain is none when resting, incr. To 5-6 . RN notified.    Mobility  Bed Mobility Bed Mobility: Rolling Left;Left Sidelying to Sit Rolling Left: 4: Min assist;With rail Right Sidelying to Sit: 3: Mod assist Details for Bed Mobility Assistance: cues to roll to protect abdomen., cues to flex knees t prep to roll. Transfers Sit to Stand: From bed;3: Mod assist;From elevated surface;With upper extremity assist;From chair/3-in-1 Stand to Sit: 4: Min assist;To chair/3-in-1;To bed;With upper extremity assist;With armrests Details for Transfer Assistance: cues for pushing up with UE's. reaching to armressts. Ambulation/Gait Ambulation/Gait Assistance: 1: +2 Total assist Ambulation/Gait: Patient Percentage: 70% Ambulation Distance (Feet): 10 Feet (x2) Assistive device: Rolling walker Ambulation/Gait Assistance Details: extra time for getting around lines/tubes and through door. Gait Pattern:  Step-through pattern;Decreased stride length;Trunk flexed Gait velocity: slow    Exercises     PT Diagnosis:    PT Problem List:   PT Treatment Interventions:     PT Goals (current goals can now be found in the care plan section)    Visit Information  Last PT Received On: 12/16/12 Assistance Needed: +2 History of Present Illness: Chief Complaint: Severe, persistent lower abdominal pain. perforated viscous/peritonitis. s/p laparoscopic appendectomy on 12/13/12    Subjective Data      Cognition  Cognition Arousal/Alertness: Awake/alert    Balance     End of Session PT - End of Session Activity Tolerance: Patient limited by fatigue;Patient limited by pain Patient left: in bed;with call bell/phone within reach Nurse Communication: Mobility status and need for medication   GP     Claretha Cooper 12/16/2012, 11:08 AM Tresa Endo PT [phone]

## 2012-12-16 NOTE — Evaluation (Signed)
Occupational Therapy Evaluation Patient Details Name: Norma Smith MRN: [id number] DOB: 1933-02-16 Today's Date: 12/16/2012 Time: [id number] OT Time Calculation (min): 27 min  OT Assessment / Plan / Recommendation History of present illness Chief Complaint: Severe, persistent lower abdominal pain. perforated viscous/peritonitis. s/p laparoscopic appendectomy on 12/13/12  Pt has a h/o CLL   Clinical Impression    Pt was admitted for the above.  She will benefit from skilled OT to increase safety and independence with adls.  Goals in acute are for supervision to min A.  OT Assessment  Patient needs continued OT Services    Follow Up Recommendations  SNF    Barriers to Discharge      Equipment Recommendations  3 in 1 bedside comode    Recommendations for Other Services    Frequency  Min 2X/week    Precautions / Restrictions Precautions Precautions: Fall Precaution Comments: abd. drain,  Restrictions Weight Bearing Restrictions: No   Pertinent Vitals/Pain 5/10 abdomen; requested pain meds and repositioned back in bed after eval.      ADL  Toilet Transfer: +2 Total assistance (for lines) Toilet Transfer: Patient Percentage: 70% Toilet Transfer Method: Sit to Loss adjuster, chartered: Bedside commode Toileting - Clothing Manipulation and Hygiene: +1 Total assistance Where Assessed - Toileting Clothing Manipulation and Hygiene: Sit to stand from 3-in-1 or toilet Equipment Used: Rolling walker Transfers/Ambulation Related to ADLs: ambulated to bathroom; A x 2 for lines ADL Comments: pt fatiqued; did not feel up to adl activities at this time.  ADLs limited by fatique/pain.  Overall, min A for UB adls and max A LB bathing; total LB dressing based on clinical judgment    OT Diagnosis: Generalized weakness  OT Problem List: Decreased strength;Decreased activity tolerance;Pain;Decreased knowledge of use of DME or AE OT Treatment Interventions: Self-care/ADL training;Energy  conservation;DME and/or AE instruction;Patient/family education   OT Goals(Current goals can be found in the care plan section) Acute Rehab OT Goals Patient Stated Goal: decreased pain OT Goal Formulation: With patient Time For Goal Achievement: 12/30/12 Potential to Achieve Goals: Good ADL Goals Pt Will Perform Lower Body Bathing: with min assist;with adaptive equipment;sit to/from stand Pt Will Perform Lower Body Dressing: with min assist;with adaptive equipment;sit to/from stand Pt Will Transfer to Toilet: with min guard assist;ambulating;bedside commode Pt Will Perform Toileting - Clothing Manipulation and hygiene: with min guard assist;sit to/from stand Additional ADL Goal #1: pt will complete ub adls with set up and initiate at least one rest break as needed during adls for energy conservation  Visit Information  Last OT Received On: 12/16/12 Assistance Needed: +2 PT/OT Co-Evaluation/Treatment: Yes History of Present Illness: Chief Complaint: Severe, persistent lower abdominal pain. perforated viscous/peritonitis. s/p laparoscopic appendectomy on 12/13/12       Prior [place] expects to be discharged to:: Private residence Living Arrangements: Alone Home Equipment: None Additional Comments: may need snf Prior Function Level of Independence: Independent Communication Communication: No difficulties         Vision/Perception     Cognition  Cognition Arousal/Alertness: Awake/alert Behavior During Therapy: WFL for tasks assessed/performed Overall Cognitive Status: Within Functional Limits for tasks assessed    Extremity/Trunk Assessment Upper Extremity Assessment Upper Extremity Assessment: Generalized weakness     Mobility Bed Mobility Bed Mobility: Rolling Left;Left Sidelying to Sit Rolling Left: 4: Min assist;With rail Left Sidelying to Sit: 3: Mod assist Details for Bed Mobility Assistance: cues to roll to protect  abdomen., cues to flex knees t  prep to roll. Transfers Sit to Stand: From bed;3: Mod assist to min assist;From elevated surface;With upper extremity assist;From chair/3-in-1 Stand to Sit: 4: Min assist;To chair/3-in-1;To bed;With upper extremity assist;With armrests Details for Transfer Assistance: cues for pushing up with UE's. reaching to armressts.     Exercise     Balance     End of Session OT - End of Session Activity Tolerance: Patient tolerated treatment well Patient left: in bed;with call bell/phone within reach  [place] 12/16/2012, 11:32 AM Lesle Chris, OTR/L [phone] 12/16/2012

## 2012-12-16 NOTE — Progress Notes (Signed)
[facility] PROGRESS NOTE  Norma Smith [id number] DOB: 08-Dec-1933 DOA: 12/13/2012  PCP: Cathlean Cower, MD  Brief HPI: 79 year old female with history of hypertension, moderate AS with HOCM, ( EF of 65-70% as per echo in June 2014, follows with Dr. Stanford Breed), diabetes mellitus on oral hypoglycemics, hypertension, iron deficiency anemia, CLL (currently stable and follows with Dr Juliann Mule) presented to the hospital with diffuse abdominal pain. She was found to have perforated appendicitis and was taken to the OR. Medicine was consulted for assistance with medical problems.   Past medical history:  Past Medical History  Diagnosis Date  . Skin cancer   . Iron deficiency 12/01/2010  . Allergy   . Diabetes mellitus   . Hyperlipidemia   . Hypertension   . Anemia   . Osteopenia   . Dry skin dermatitis 12/01/2010  . CLL (chronic lymphoblastic leukemia) 12/01/2010  . Aortic stenosis, moderate 06/16/2011    Primary Service: General Surgery  Consultants: Nephrology  Procedures: Appendectomy  Antibiotics: Ertapenem 11/22  Subjective: No changes. Patient continues to have abdominal pain and throat pain. Denies any shortness of breath at rest.   Objective: Vital Signs  Filed Vitals:   12/15/12 1800 12/15/12 2000 12/16/12 0000 12/16/12 0400  BP: 130/43 112/40 142/47 132/36  Pulse: 89 92 88 93  Temp:  98.4 F (36.9 C) 98.7 F (37.1 C) 98.6 F (37 C)  TempSrc:  Oral Oral Oral  Resp: 16 15 24 14   Height:      Weight:      SpO2: 96% 96% 94% 95%    Filed Weights   12/14/12 0200 12/15/12 0600  Weight: 71 kg (156 lb 8.4 oz) 72.9 kg (160 lb 11.5 oz)    Intake/Output from previous day: 11/24 0701 - 11/25 0700 In: 2450 [I.V.:2400; IV Piggyback:50] Out: [id number] [Urine:850; Drains:190]  General appearance: alert, cooperative, appears stated age and no distress Resp: Diminished air entry at bases. Few crackles. Cardio: regular rate and rhythm, S1, S2 normal. Loud systolic murmur  over precordium. No click, rub or gallop GI: Covered in dressing with a drain. BS heard.  Extremities: extremities normal, atraumatic, no cyanosis or edema Neurologic: Alert and oriented x 2. No focal deficits  Lab Results:  Basic Metabolic Panel:  Recent Labs Lab 12/13/12 1835 12/14/12 0345 12/15/12 0341 12/16/12 0340  NA 136 133* 135 134*  K 3.5 3.8 4.4 4.1  CL 97 98 105 105  CO2 26 25 22 21   GLUCOSE 153* 141* 151* 139*  BUN 41* 42* 43* 29*  CREATININE 1.40* 1.37* 1.50* 1.12*  CALCIUM 9.3 8.5 7.6* 7.7*   Liver Function Tests:  Recent Labs Lab 12/13/12 1835 12/15/12 0341  AST 22 21  ALT 10 9  ALKPHOS 45 47  BILITOT 0.7 0.2*  PROT 6.8 5.1*  ALBUMIN 3.3* 2.1*    Recent Labs Lab 12/13/12 1835  LIPASE 12   CBC:  Recent Labs Lab 12/13/12 1835 12/14/12 0345 12/15/12 0341 12/16/12 0340  WBC 20.4* 13.3* 13.0* 14.8*  NEUTROABS 14.5*  --   --   --   HGB 10.5* 10.0* 8.4* 8.9*  HCT 31.0* 29.4* 25.4* 27.0*  MCV 95.7 93.6 95.5 96.4  PLT 216 175 174 213   CBG:  Recent Labs Lab 12/15/12 0731 12/15/12 1128 12/15/12 1524 12/15/12 1941 12/15/12 2329  GLUCAP 122* 132* 139* 128* 124*    Recent Results (from the past 240 hour(s))  URINE CULTURE     Status: None   Collection Time  12/13/12  7:39 PM      Result Value Range Status   Specimen Description URINE, CLEAN CATCH   Final   Special Requests NONE   Final   Culture  Setup Time     Final   Value: 12/14/2012 03:28     Performed at [facility] Count     Final   Value: 60,000 COLONIES/ML     Performed at [facility]   Culture     Final   Value: Multiple bacterial morphotypes present, none predominant. Suggest appropriate recollection if clinically indicated.     Performed at [facility]   Report Status 12/16/2012 FINAL   Final  MRSA PCR SCREENING     Status: None   Collection Time    12/14/12  1:30 AM      Result Value Range Status   MRSA by PCR NEGATIVE   NEGATIVE Final   Comment:            The GeneXpert MRSA Assay (FDA     approved for NASAL specimens     only), is one component of a     comprehensive MRSA colonization     surveillance program. It is not     intended to diagnose MRSA     infection nor to guide or     monitor treatment for     MRSA infections.      Studies/Results: Dg Chest Port 1 View  12/15/2012   CLINICAL DATA:  Shortness of breath, status post appendectomy  EXAM: PORTABLE CHEST - 1 VIEW  COMPARISON:  12/13/2012  FINDINGS: NG tube within the proximal stomach. Heart is enlarged with new developing pleural effusions and basilar atelectasis/consolidation. Upper lobes remain clear. No pneumothorax. Atherosclerosis of the aorta.  IMPRESSION: Increased basilar atelectasis/ consolidation with developing pleural effusions.  Cardiomegaly   Electronically Signed   By: Daryll Brod M.D.   On: 12/15/2012 09:33    Medications:  Scheduled: . bisacodyl  10 mg Rectal BID  . enoxaparin (LOVENOX) injection  40 mg Subcutaneous Q24H  . ertapenem (INVANZ) IV  1 g Intravenous Q24H  . insulin aspart  0-15 Units Subcutaneous Q4H  . pantoprazole (PROTONIX) IV  40 mg Intravenous Daily   Continuous: . dextrose 5 % and 0.45 % NaCl with KCl 20 mEq/L 100 mL/hr at 12/16/12 0420   [id number], morphine injection, ondansetron (ZOFRAN) IV, ondansetron, phenol  Assessment/Plan:  Principal Problem:   Acute fulminating appendicitis with perforation and peritonitis Active Problems:   Hypertension   CLL (chronic lymphocytic leukemia)   Aortic stenosis, moderate   Hypertrophic obstructive cardiomyopathy(425.11)   AKI (acute kidney injury)    Perforated acute appendicitis with peritonitis  Status post laparoscopic appendectomy. Management per surgery. Incentive spirometry.   Acute kidney injury  Improved UO yesterday with improvement in creatinine. Await Nephrology input. Is in positive fluid balance. CXR was concerning for mild  fluid overload but patient is asymptomatic. Monitor for now.  Aortic stenosis and HOCM  Last echo 5 months back with EF of 65-70% with HOCM. Being followed by Dr Stanford Breed as OP. Patient is on metoprolol and amlodipine at home. Continue PRn metoprolol IV.  Resume oral medications as soon as able. Avoid afterload reducing agents like hydralazine. Aspirin on hold. Consult cardiology if cardiac status changes. Assess volume status closely.   Diabetes mellitus  Patient on different oral hypoglycemics. Last A1c from May of 4.7. Blood glucose currently stable. Will be maintained on sliding  scale insulin. Patient remains n.p.o. for now.   Hypertension  Blood pressure is currently stable. Continue when necessary IV Lopressor.   Iron deficiency anemia  Hemoglobin currently stable. Iron sulfate on hold.   CLL  Currently stable and not on any treatment. Followed in oncology as outpatient.   Hyperlipidemia  Lipitor on hold.   Code Status: Full code. Patient brought up advance directives again. I have asked her to discuss this with the primary attending. RN also notified. DVT Prophylaxis: SCD's    Family Communication: Discussed with patient.  Disposition Plan: Per surgery. Will need telemetry if transferred.     LOS: 3 days   [facility] Pager [phone] 12/16/2012, 7:21 AM  If 8PM-8AM, please contact night-coverage at [email or web address], password [facility]

## 2012-12-16 NOTE — Progress Notes (Addendum)
General surgery attending:  I have interviewed and assessed this patient today. I have discussed the treatment plan with her and with her daughter.  I certainly appreciate all of the advice and input from [facility] and nephrology.  She is more alert and seems more stable and seems to be in less pain. Her abdomen is still somewhat distended and tympanitic, but hopefully we can get  the NG tube out soon. I think that would help things.  High risk for postop abscess. Continue Invanz.  Edsel Petrin. Dalbert Batman, M.D., [facility] General and Minimally invasive Surgery Breast and Colorectal Surgery Office:   [phone] Pager:   [phone]

## 2012-12-16 NOTE — Progress Notes (Signed)
Assessment:  1 Oliguric Acute Kidney Injury, hemodynamically mediated due to relative hypotension, ARB therapy and SIRS (s/p appendectomy w/rupture) --improved 2 Aortic Stenosis  3 Volume overload/Pleural effusion  4 Hx of CLL  Plan:  1 Decrease IVF   Subjective: Interval History: Feels better  Objective: Vital signs in last 24 hours: Temp:  [98.1 F (36.7 C)-98.7 F (37.1 C)] 98.6 F (37 C) (11/25 0400) Pulse Rate:  [86-93] 93 (11/25 0400) Resp:  [14-26] 14 (11/25 0400) BP: (112-142)/(36-51) 132/36 mmHg (11/25 0400) SpO2:  [93 %-96 %] 95 % (11/25 0400) Weight change:   Intake/Output from previous day: 11/24 0701 - 11/25 0700 In: 2450 [I.V.:2400; IV Piggyback:50] Out: 1040 [Urine:850; Drains:190] Intake/Output this shift:    General appearance: alert and cooperative Resp: clear to auscultation bilaterally Cardio: regular rate and rhythm, S1, S2 normal, no murmur, click, rub or gallop Extremities: edema 1+  Lab Results:  Recent Labs  12/15/12 0341 12/16/12 0340  WBC 13.0* 14.8*  HGB 8.4* 8.9*  HCT 25.4* 27.0*  PLT 174 213   BMET:  Recent Labs  12/15/12 0341 12/16/12 0340  NA 135 134*  K 4.4 4.1  CL 105 105  CO2 22 21  GLUCOSE 151* 139*  BUN 43* 29*  CREATININE 1.50* 1.12*  CALCIUM 7.6* 7.7*   No results found for this basename: PTH,  in the last 72 hours Iron Studies: No results found for this basename: IRON, TIBC, TRANSFERRIN, FERRITIN,  in the last 72 hours Studies/Results: Dg Chest Port 1 View  12/15/2012   CLINICAL DATA:  Shortness of breath, status post appendectomy  EXAM: PORTABLE CHEST - 1 VIEW  COMPARISON:  12/13/2012  FINDINGS: NG tube within the proximal stomach. Heart is enlarged with new developing pleural effusions and basilar atelectasis/consolidation. Upper lobes remain clear. No pneumothorax. Atherosclerosis of the aorta.  IMPRESSION: Increased basilar atelectasis/ consolidation with developing pleural effusions.  Cardiomegaly    Electronically Signed   By: Daryll Brod M.D.   On: 12/15/2012 09:33   Scheduled: . bisacodyl  10 mg Rectal BID  . enoxaparin (LOVENOX) injection  40 mg Subcutaneous Q24H  . ertapenem (INVANZ) IV  1 g Intravenous Q24H  . insulin aspart  0-15 Units Subcutaneous Q4H  . pantoprazole (PROTONIX) IV  40 mg Intravenous Daily    LOS: 3 days   Rodarius Kichline C 12/16/2012,7:44 AM

## 2012-12-16 NOTE — Progress Notes (Signed)
3 Days Post-Op  Subjective: Pt feels much better.  Pain well controlled.  C/o pain in nose more than anything.  Ambulating some.  Noted some flatus, and nurses noted small amounts of stool passing this am.  Patient and daughter want to make her DNR, but would also like to talk to palliative care team.    Objective: Vital signs in last 24 hours: Temp:  [98.1 F (36.7 C)-98.7 F (37.1 C)] 98.6 F (37 C) (11/25 0400) Pulse Rate:  [86-93] 93 (11/25 0400) Resp:  [14-26] 14 (11/25 0400) BP: (112-142)/(36-51) 132/36 mmHg (11/25 0400) SpO2:  [94 %-96 %] 95 % (11/25 0400) Last BM Date: 12/12/12  Intake/Output from previous day: 11/24 0701 - 11/25 0700 In: 2450 [I.V.:2400; IV Piggyback:50] Out: 1040 [Urine:850; Drains:190] Intake/Output this shift:    PE: Gen:  Alert, NAD, pleasant, discomfort due to NG tube Abd: Soft, mildly distended, mild tenderness, +BS, no HSM, incisions C/D/I, drain with serosanguinous drainage (171mL)   Lab Results:   Recent Labs  12/15/12 0341 12/16/12 0340  WBC 13.0* 14.8*  HGB 8.4* 8.9*  HCT 25.4* 27.0*  PLT 174 213   BMET  Recent Labs  12/15/12 0341 12/16/12 0340  NA 135 134*  K 4.4 4.1  CL 105 105  CO2 22 21  GLUCOSE 151* 139*  BUN 43* 29*  CREATININE 1.50* 1.12*  CALCIUM 7.6* 7.7*   PT/INR No results found for this basename: LABPROT, INR,  in the last 72 hours CMP     Component Value Date/Time   NA 134* 12/16/2012 0340   NA 142 12/01/2012 0949   K 4.1 12/16/2012 0340   K 4.3 12/01/2012 0949   CL 105 12/16/2012 0340   CL 105 12/14/2011 0851   CO2 21 12/16/2012 0340   CO2 28 12/01/2012 0949   GLUCOSE 139* 12/16/2012 0340   GLUCOSE 103 12/01/2012 0949   GLUCOSE 100* 12/14/2011 0851   BUN 29* 12/16/2012 0340   BUN 20.5 12/01/2012 0949   CREATININE 1.12* 12/16/2012 0340   CREATININE 1.1 12/01/2012 0949   CALCIUM 7.7* 12/16/2012 0340   CALCIUM 10.1 12/01/2012 0949   PROT 5.1* 12/15/2012 0341   PROT 6.9 12/01/2012 0949   ALBUMIN 2.1* 12/15/2012 0341   ALBUMIN 3.7 12/01/2012 0949   AST 21 12/15/2012 0341   AST 22 12/01/2012 0949   ALT 9 12/15/2012 0341   ALT 11 12/01/2012 0949   ALKPHOS 47 12/15/2012 0341   ALKPHOS 50 12/01/2012 0949   BILITOT 0.2* 12/15/2012 0341   BILITOT 0.53 12/01/2012 0949   GFRNONAA 46* 12/16/2012 0340   GFRAA 53* 12/16/2012 0340   Lipase     Component Value Date/Time   LIPASE 12 12/13/2012 1835       Studies/Results: Dg Chest Port 1 View  12/15/2012   CLINICAL DATA:  Shortness of breath, status post appendectomy  EXAM: PORTABLE CHEST - 1 VIEW  COMPARISON:  12/13/2012  FINDINGS: NG tube within the proximal stomach. Heart is enlarged with new developing pleural effusions and basilar atelectasis/consolidation. Upper lobes remain clear. No pneumothorax. Atherosclerosis of the aorta.  IMPRESSION: Increased basilar atelectasis/ consolidation with developing pleural effusions.  Cardiomegaly   Electronically Signed   By: Daryll Brod M.D.   On: 12/15/2012 09:33    Anti-infectives: Anti-infectives   Start     Dose/Rate Route Frequency Ordered Stop   12/14/12 2100  ertapenem (INVANZ) 1 g in sodium chloride 0.9 % 50 mL IVPB     1 g 100 mL/hr  over 30 Minutes Intravenous Every 24 hours 12/14/12 0148     12/13/12 2130  [MAR Hold]  ertapenem (INVANZ) 1 g in sodium chloride 0.9 % 50 mL IVPB     (On MAR Hold since 12/13/12 2152)  Comments:  stat   1 g 100 mL/hr over 30 Minutes Intravenous  Once 12/13/12 2119 12/13/12 2208       Assessment/Plan 1. POD 3, s/p lap appy for perforated appendicitis  2. HTN  3. DM  4. Aortic stenosis, moderate  5. CLL  6. ARI  7. Leukocytosis up to 14.8 today - pulm source?  Plan:  1. Appreciate medicine's assistance with this patient  2. Creatinine improved today, can likely d/c foley today if okay with renal 3. Ileus seems to be resolving, +BS, small BM's, and flatus. Pt tolerated clamping trails for 24 hours yesterday, checking residuals and  if <277ml then can d/c NG tube and start clear liquids 4. PT/OT eval and treat, recommending SNF 5. Daily dressing to JP drain site  6. Labs in AM  7. Invanz D: 3/7  8. Encourage IS, ambulation, SCD's, Lovenox     LOS: 3 days    Coralie Keens 12/16/2012, 8:47 AM Pager: [phone]

## 2012-12-17 ENCOUNTER — Inpatient Hospital Stay (HOSPITAL_COMMUNITY): Payer: Medicare Other

## 2012-12-17 ENCOUNTER — Encounter (HOSPITAL_COMMUNITY): Payer: Self-pay | Admitting: *Deleted

## 2012-12-17 LAB — BASIC METABOLIC PANEL
BUN: 19 mg/dL (ref 6–23)
Calcium: 8.3 mg/dL — ABNORMAL LOW (ref 8.4–10.5)
Creatinine, Ser: 0.92 mg/dL (ref 0.50–1.10)
GFR calc Af Amer: 68 mL/min — ABNORMAL LOW (ref >90)
GFR calc non Af Amer: 59 mL/min — ABNORMAL LOW (ref >90)
Glucose, Bld: 101 mg/dL — ABNORMAL HIGH (ref 70–99)

## 2012-12-17 LAB — GLUCOSE, CAPILLARY
Glucose-Capillary: 92 mg/dL (ref 70–99)
Glucose-Capillary: 116 mg/dL — ABNORMAL HIGH (ref 70–99)
Glucose-Capillary: 144 mg/dL — ABNORMAL HIGH (ref 70–99)
Glucose-Capillary: 109 mg/dL — ABNORMAL HIGH (ref 70–99)

## 2012-12-17 MED ORDER — BISACODYL 10 MG RE SUPP
10.0000 mg | Freq: Two times a day (BID) | RECTAL | Status: AC
  Administered 2012-12-17 (×2): 10 mg via RECTAL
  Filled 2012-12-17 (×2): qty 1

## 2012-12-17 MED ORDER — OXYCODONE HCL 5 MG/5ML PO SOLN
5.0000 mg | ORAL | Status: DC | PRN
  Administered 2012-12-18 – 2012-12-19 (×5): 5 mg via ORAL
  Filled 2012-12-17 (×5): qty 5

## 2012-12-17 MED ORDER — BIOTENE DRY MOUTH MT LIQD
15.0000 mL | Freq: Four times a day (QID) | OROMUCOSAL | Status: DC
  Administered 2012-12-17 – 2012-12-22 (×15): 15 mL via OROMUCOSAL

## 2012-12-17 MED ORDER — ACETAMINOPHEN 160 MG/5ML PO SOLN: 650.0000 mg | ORAL | Status: DC | PRN

## 2012-12-17 NOTE — Progress Notes (Addendum)
4 Days Post-Op  Subjective: She looks really good, wants to know who will take foley out.  Objective: Vital signs in last 24 hours: Temp:  [97.8 F (36.6 C)-98.8 F (37.1 C)] 98.2 F (36.8 C) (11/26 0400) Pulse Rate:  [87-94] 92 (11/26 0600) Resp:  [12-27] 14 (11/26 0600) BP: (121-165)/(36-47) 165/47 mmHg (11/26 0600) SpO2:  [90 %-97 %] 96 % (11/26 0600) Last BM Date: 12/16/12 185 FROM THE DRAIN 2BM's recorded Afebrile, VSS No labs this AM Film:  Hyperlucency over the liver shadow compatible with residual  intra-abdominal free air.   Small bowel predominant gaseous distension of bowel most  compatible with postoperative ileus.  Intake/Output from previous day: 11/25 0701 - 11/26 0700 In: 359.5 [I.V.:309.5; IV Piggyback:50] Out: 1380 [id number]; Drains:185] Intake/Output this shift:    General appearance: alert, cooperative and no distress Resp: clear to auscultation bilaterally GI: soft, sore, but not tender.  Incisions look good few BS, no distension, drain is serous in color.  Lab Results:   Recent Labs  12/15/12 0341 12/16/12 0340  WBC 13.0* 14.8*  HGB 8.4* 8.9*  HCT 25.4* 27.0*  PLT 174 213    BMET  Recent Labs  12/15/12 0341 12/16/12 0340  NA 135 134*  K 4.4 4.1  CL 105 105  CO2 22 21  GLUCOSE 151* 139*  BUN 43* 29*  CREATININE 1.50* 1.12*  CALCIUM 7.6* 7.7*   PT/INR No results found for this basename: LABPROT, INR,  in the last 72 hours   Recent Labs Lab 12/13/12 1835 12/15/12 0341  AST 22 21  ALT 10 9  ALKPHOS 45 47  BILITOT 0.7 0.2*  PROT 6.8 5.1*  ALBUMIN 3.3* 2.1*     Lipase     Component Value Date/Time   LIPASE 12 12/13/2012 1835     Studies/Results: Dg Chest Port 1 View  12/15/2012   CLINICAL DATA:  Shortness of breath, status post appendectomy  EXAM: PORTABLE CHEST - 1 VIEW  COMPARISON:  12/13/2012  FINDINGS: NG tube within the proximal stomach. Heart is enlarged with new developing pleural effusions and basilar  atelectasis/consolidation. Upper lobes remain clear. No pneumothorax. Atherosclerosis of the aorta.  IMPRESSION: Increased basilar atelectasis/ consolidation with developing pleural effusions.  Cardiomegaly   Electronically Signed   By: Daryll Brod M.D.   On: 12/15/2012 09:33    Medications: . antiseptic oral rinse  15 mL Mouth Rinse QID  . enoxaparin (LOVENOX) injection  40 mg Subcutaneous Q24H  . ertapenem (INVANZ) IV  1 g Intravenous Q24H  . insulin aspart  0-15 Units Subcutaneous Q4H  . pantoprazole (PROTONIX) IV  40 mg Intravenous Daily    Assessment/Plan Acute perforated appendicitis with peritonitis S/p Laparoscopic appendectomy, 12/13/2012, Odis Hollingshead, MD. Acute renal insuffiencey Aortic Stenosis/HOCM AODM HYPERTENSION CLL  PlAn:  From our standpoint her foley can come out, she can be mobilized and started on clears. She is also ready to go to floor/telemetry if OK with Medicine and Nephrology.  Her film shows contrast in the right colon,with allot of SB distension.  I will start her on some clears.   She has OT/PT ordered and I will increase her ambulation/ IS. I have ordered a BMP for this AM to be sure renal function continues to improve.  OK to d/c foley per Dr. Florene Glen.    LOS: 4 days    Marquise Wicke 12/17/2012

## 2012-12-17 NOTE — Progress Notes (Signed)
Assessment:  1 Oliguric Acute Kidney Injury, hemodynamically mediated due to relative hypotension, ARB therapy and SIRS (s/p appendectomy w/rupture) --prob resolved (lab pending)  2 Aortic Stenosis  3 Volume overload/Pleural effusion  4 Hx of CLL  Plan:   Maintenance IVF at 50 cc/hr. I will sign off. Please re consult prn (labs pending at this time).  Will need to resume BP meds at discharge or soon after.  Subjective: Interval History: Good UOP  Objective: Vital signs in last 24 hours: Temp:  [97.8 F (36.6 C)-98.8 F (37.1 C)] 98.2 F (36.8 C) (11/26 0400) Pulse Rate:  [87-94] 92 (11/26 0600) Resp:  [12-27] 14 (11/26 0600) BP: (121-165)/(36-47) 165/47 mmHg (11/26 0600) SpO2:  [90 %-97 %] 96 % (11/26 0600) Weight change:   Intake/Output from previous day: 11/25 0701 - 11/26 0700 In: 359.5 [I.V.:309.5; IV Piggyback:50] Out: [id number]; Drains:185] Intake/Output this shift:    General appearance: alert and cooperative Resp: clear to auscultation bilaterally Extremities: edema tr to 1 In bed appears weak  Lab Results:  Recent Labs  12/15/12 0341 12/16/12 0340  WBC 13.0* 14.8*  HGB 8.4* 8.9*  HCT 25.4* 27.0*  PLT 174 213   BMET:  Recent Labs  12/15/12 0341 12/16/12 0340  NA 135 134*  K 4.4 4.1  CL 105 105  CO2 22 21  GLUCOSE 151* 139*  BUN 43* 29*  CREATININE 1.50* 1.12*  CALCIUM 7.6* 7.7*   No results found for this basename: PTH,  in the last 72 hours Iron Studies: No results found for this basename: IRON, TIBC, TRANSFERRIN, FERRITIN,  in the last 72 hours Studies/Results: Dg Chest Port 1 View  12/15/2012   CLINICAL DATA:  Shortness of breath, status post appendectomy  EXAM: PORTABLE CHEST - 1 VIEW  COMPARISON:  12/13/2012  FINDINGS: NG tube within the proximal stomach. Heart is enlarged with new developing pleural effusions and basilar atelectasis/consolidation. Upper lobes remain clear. No pneumothorax. Atherosclerosis of the aorta.  IMPRESSION:  Increased basilar atelectasis/ consolidation with developing pleural effusions.  Cardiomegaly   Electronically Signed   By: Daryll Brod M.D.   On: 12/15/2012 09:33   Scheduled: . antiseptic oral rinse  15 mL Mouth Rinse QID  . enoxaparin (LOVENOX) injection  40 mg Subcutaneous Q24H  . ertapenem (INVANZ) IV  1 g Intravenous Q24H  . insulin aspart  0-15 Units Subcutaneous Q4H  . pantoprazole (PROTONIX) IV  40 mg Intravenous Daily     LOS: 4 days   Davanna He C 12/17/2012,8:09 AM

## 2012-12-17 NOTE — Progress Notes (Signed)
I agree. See my dictation earlier  Norma Smith

## 2012-12-17 NOTE — Progress Notes (Signed)
Follow-up Call back received from patient's daughter Jackelyn Poling (lives in [place]) c: [phone]; wk: [phone]; she is coming into town tomorrow and she and her sister Clarene Critchley from [place], will be present for the PMT meeting -sister [name] who lives in [place] may participate by phone   Meeting time confirmed for tomorrow Thursday 11/27 @ 11:00 am   Danton Sewer, RN 12/17/2012, 11:43 AM Palliative Medicine Team RN Liaison [phone]

## 2012-12-17 NOTE — Progress Notes (Signed)
General surgery attending:  I have examined and interviewed this patient this morning. I agree with the assessment and treatment plan outlined by Mr. Creig Hines, [place].  The patient is still somewhat distended and still has a partial ileus. She is not symptomatic, however, since she is passing flatus and is hungry.  Her abdomen is soft, appropriately tender. Wound and drain sites look good.  We will continue Invanz and cautiously allow sips of clear liquids. Foley catheter to be removed. Recommend PT and OT consults.   Edsel Petrin. Dalbert Batman, M.D., [facility] General and Minimally invasive Surgery Breast and Colorectal Surgery Office:   [phone] Pager:   [phone]

## 2012-12-17 NOTE — Progress Notes (Signed)
[facility] PROGRESS NOTE  Norma Smith [id number] DOB: [birth year] DOA: 12/13/2012  PCP: Cathlean Cower, MD  Brief Narrative: Pt is a 79 year old female with history of hypertension, moderate AS with HOCM, ( EF of 65-70% as per echo in June 2014, follows with Dr. Stanford Breed), diabetes mellitus on oral hypoglycemics, hypertension, iron deficiency anemia, CLL (currently stable and follows with Dr Juliann Mule) presented to the hospital with diffuse abdominal pain. She was found to have perforated appendicitis and was taken to the OR. Medicine was consulted for assistance with medical problems.   Assessment/Plan:  Perforated acute appendicitis with peritonitis  Status post laparoscopic appendectomy. Management per surgery.   Acute kidney injury  -Secondary to ARB, relative hypotension, SIRS -Resolved with hydration  Aortic stenosis and HOCM  Last echo 5 months back with EF of 65-70% with HOCM. Being followed by Dr Stanford Breed as OP. Patient is on metoprolol and amlodipine at home. Continue PRn metoprolol IV.  Resume oral medications as soon as able. Avoid afterload reducing agents like hydralazine. Aspirin on hold. Consult cardiology if cardiac status changes.  -Patient remains asymptomatic, and net I/O negative at this time.  Diabetes mellitus  Patient on different oral hypoglycemics. Last A1c from May of 4.7. Blood glucose currently stable.  Patient now on clear liquids, continue sliding scale insulin and follow  Hypertension  Blood pressure is currently stable. Continue when necessary IV Lopressor.  -Follow and resume by mouth meds at discharge as per renal recommendations or as clinically appropriate Iron deficiency anemia  Hemoglobin currently stable. Iron sulfate on hold.   CLL  Currently stable and not on any treatment. Followed in oncology as outpatient.   Hyperlipidemia  Lipitor on hold.   Code Status: Full code.  DVT Prophylaxis: SCD's    Family Communication: Discussed with  patient.  Disposition Plan: Per surgery. Will need telemetry if transferred.   Primary Service: General Surgery  Consultants: Nephrology  Procedures: Appendectomy  Antibiotics: Ertapenem 11/22  Subjective: States she is feeling better today with no abd pain. Denies any shortness of breath and denies chest pain.   Objective: Vital Signs  Filed Vitals:   12/17/12 0300 12/17/12 0400 12/17/12 0500 12/17/12 0600  BP:  132/40  165/47  Pulse: 91 91 93 92  Temp:  98.2 F (36.8 C)    TempSrc:  Oral    Resp: 16 16 17 14   Height:      Weight:      SpO2: 95% 95% 95% 96%    Filed Weights   12/14/12 0200 12/15/12 0600  Weight: 71 kg (156 lb 8.4 oz) 72.9 kg (160 lb 11.5 oz)    Intake/Output from previous day: 11/25 0701 - 11/26 0700 In: 359.5 [I.V.:309.5; IV Piggyback:50] Out: 1380 [id number]; Drains:185]  General appearance: alert, cooperative, appears stated age and no distress Resp: Diminished air entry at bases. Few crackles. Cardio: regular rate and rhythm, S1, S2 normal. Loud systolic murmur over precordium. No click, rub or gallop Abd: Covered in dressing with a drain.+ BS .  Extremities: extremities normal, atraumatic, no cyanosis or edema Neurologic: Alert and oriented x 2. No focal deficits  Lab Results:  Basic Metabolic Panel:  Recent Labs Lab 12/13/12 1835 12/14/12 0345 12/15/12 0341 12/16/12 0340  NA 136 133* 135 134*  K 3.5 3.8 4.4 4.1  CL 97 98 105 105  CO2 26 25 22 21   GLUCOSE 153* 141* 151* 139*  BUN 41* 42* 43* 29*  CREATININE 1.40* 1.37* 1.50* 1.12*  CALCIUM  9.3 8.5 7.6* 7.7*   Liver Function Tests:  Recent Labs Lab 12/13/12 1835 12/15/12 0341  AST 22 21  ALT 10 9  ALKPHOS 45 47  BILITOT 0.7 0.2*  PROT 6.8 5.1*  ALBUMIN 3.3* 2.1*    Recent Labs Lab 12/13/12 1835  LIPASE 12   CBC:  Recent Labs Lab 12/13/12 1835 12/14/12 0345 12/15/12 0341 12/16/12 0340  WBC 20.4* 13.3* 13.0* 14.8*  NEUTROABS 14.5*  --   --   --   HGB  10.5* 10.0* 8.4* 8.9*  HCT 31.0* 29.4* 25.4* 27.0*  MCV 95.7 93.6 95.5 96.4  PLT 216 175 174 213   CBG:  Recent Labs Lab 12/16/12 1615 12/16/12 1921 12/17/12 0009 12/17/12 0321 12/17/12 0752  GLUCAP 105* 111* 97 104* 92    Recent Results (from the past 240 hour(s))  URINE CULTURE     Status: None   Collection Time    12/13/12  7:39 PM      Result Value Range Status   Specimen Description URINE, CLEAN CATCH   Final   Special Requests NONE   Final   Culture  Setup Time     Final   Value: 12/14/2012 03:28     Performed at [facility]     Final   Value: 60,000 COLONIES/ML     Performed at [facility]   Culture     Final   Value: Multiple bacterial morphotypes present, none predominant. Suggest appropriate recollection if clinically indicated.     Performed at [facility]   Report Status 12/16/2012 FINAL   Final  MRSA PCR SCREENING     Status: None   Collection Time    12/14/12  1:30 AM      Result Value Range Status   MRSA by PCR NEGATIVE  NEGATIVE Final   Comment:            The GeneXpert MRSA Assay (FDA     approved for NASAL specimens     only), is one component of a     comprehensive MRSA colonization     surveillance program. It is not     intended to diagnose MRSA     infection nor to guide or     monitor treatment for     MRSA infections.      Studies/Results: Dg Chest Port 1 View  12/15/2012   CLINICAL DATA:  Shortness of breath, status post appendectomy  EXAM: PORTABLE CHEST - 1 VIEW  COMPARISON:  12/13/2012  FINDINGS: NG tube within the proximal stomach. Heart is enlarged with new developing pleural effusions and basilar atelectasis/consolidation. Upper lobes remain clear. No pneumothorax. Atherosclerosis of the aorta.  IMPRESSION: Increased basilar atelectasis/ consolidation with developing pleural effusions.  Cardiomegaly   Electronically Signed   By: Daryll Brod M.D.   On: 12/15/2012 09:33   Dg Abd Portable  1v  12/17/2012   CLINICAL DATA:  Postoperative abdominal distention.  EXAM: PORTABLE ABDOMEN - 1 VIEW  COMPARISON:  12/13/2012.  FINDINGS: Gaseous distension of large and small bowel is present, most compatible with postoperative ileus. There is hyperlucency over the liver shadow, likely representing a small amount of residual free air. There is no enteric tube present. No stool is identified within the rectosigmoid. Contrast is present in the ascending colon.  IMPRESSION: 1. Surgical drain in the anatomic pelvis compatible with recent operation. 2. Hyperlucency over the liver shadow compatible with residual intra-abdominal free air. 3.  Small bowel predominant gaseous distension of bowel most compatible with postoperative ileus.   Electronically Signed   By: Dereck Ligas M.D.   On: 12/17/2012 08:14    Medications:  Scheduled: . antiseptic oral rinse  15 mL Mouth Rinse QID  . bisacodyl  10 mg Rectal BID  . enoxaparin (LOVENOX) injection  40 mg Subcutaneous Q24H  . ertapenem (INVANZ) IV  1 g Intravenous Q24H  . insulin aspart  0-15 Units Subcutaneous Q4H  . pantoprazole (PROTONIX) IV  40 mg Intravenous Daily   Continuous: . dextrose 5 % and 0.45 % NaCl with KCl 20 mEq/L 10 mL/hr at 12/16/12 [id number]   [id number], metoprolol, morphine injection, ondansetron (ZOFRAN) IV, ondansetron, oxyCODONE, phenol  Principal Problem:   Acute fulminating appendicitis with perforation and peritonitis Active Problems:   Hypertension   CLL (chronic lymphocytic leukemia)   Aortic stenosis, moderate   Hypertrophic obstructive cardiomyopathy(425.11)   AKI (acute kidney injury)    LOS: 4 days   Sheila Oats  [facility] Pager [phone] 12/17/2012, 8:24 AM  If 8PM-8AM, please contact night-coverage at [email or web address], password [facility]

## 2012-12-17 NOTE — Progress Notes (Signed)
Follow up  Spoke to daughter Jackelyn Poling (lives in [place]) c: [phone]; wk: [phone]; she is coming into town tomorrow and she and her sister Clarene Critchley from [place], will be present for the PMT meeting -sister [name] who lives in [place] may participate by phone  Meeting time rescheduled  for tomorrow Thursday 11/27 @ 4pm with Dr Lovena Le  Wadie Lessen NP  Palliative Medicine Team Team Phone # [phone] Pager [phone]

## 2012-12-17 NOTE — Progress Notes (Signed)
CARE MANAGEMENT NOTE 12/17/2012  Patient:  Norma Smith, Norma Smith   Account Number:  [id number]  Date Initiated:  12/14/2012  Documentation initiated by:  [facility]  Subjective/Objective Assessment:   79 year old female admitted with perforated appendix and underwent appendectomy.     Action/Plan:   From home.   Anticipated DC Date:  12/20/2012   Anticipated DC Plan:  SKILLED NURSING FACILITY  In-house referral  Clinical Social Worker  Hospice / [place]  CM consult      Choice offered to / List presented to:             Status of service:  In process, will continue to follow Medicare Important Message given?  NA - LOS <3 / Initial given by admissions (If response is "NO", the following Medicare IM given date fields will be blank) Date Medicare IM given:   Date Additional Medicare IM given:    Discharge Disposition:    Per UR Regulation:  Reviewed for med. necessity/level of care/duration of stay  If discussed at [place] of Stay Meetings, dates discussed:    Comments:  [id number]/Rhonda Victorio Palm: Case management [phone] Chart reviewed and updated.  Next chart review due on [id number]. Needs for discharge at time of review:  palliative care consult pending on arrival of the family members from [place].

## 2012-12-18 DIAGNOSIS — Z515 Encounter for palliative care: Secondary | ICD-10-CM

## 2012-12-18 DIAGNOSIS — K560 Paralytic ileus: Secondary | ICD-10-CM

## 2012-12-18 DIAGNOSIS — D649 Anemia, unspecified: Secondary | ICD-10-CM

## 2012-12-18 DIAGNOSIS — K352 Acute appendicitis with generalized peritonitis, without abscess: Principal | ICD-10-CM

## 2012-12-18 DIAGNOSIS — R109 Unspecified abdominal pain: Secondary | ICD-10-CM

## 2012-12-18 DIAGNOSIS — K929 Disease of digestive system, unspecified: Secondary | ICD-10-CM

## 2012-12-18 LAB — CBC
HCT: 25.3 % — ABNORMAL LOW (ref 36.0–46.0)
MCH: 31.2 pg (ref 26.0–34.0)
MCHC: 32.8 g/dL (ref 30.0–36.0)
MCV: 95.1 fL (ref 78.0–100.0)
Platelets: 275 10*3/uL (ref 150–400)
RBC: 2.66 MIL/uL — ABNORMAL LOW (ref 3.87–5.11)
RDW: 14 % (ref 11.5–15.5)
WBC: 13.4 10*3/uL — ABNORMAL HIGH (ref 4.0–10.5)

## 2012-12-18 LAB — BASIC METABOLIC PANEL
CO2: 24 mEq/L (ref 19–32)
Calcium: 8.3 mg/dL — ABNORMAL LOW (ref 8.4–10.5)
Chloride: 107 mEq/L (ref 96–112)
Creatinine, Ser: 0.9 mg/dL (ref 0.50–1.10)
GFR calc non Af Amer: 60 mL/min — ABNORMAL LOW (ref >90)
Glucose, Bld: 112 mg/dL — ABNORMAL HIGH (ref 70–99)

## 2012-12-18 LAB — GLUCOSE, CAPILLARY
Glucose-Capillary: 113 mg/dL — ABNORMAL HIGH (ref 70–99)
Glucose-Capillary: 124 mg/dL — ABNORMAL HIGH (ref 70–99)
Glucose-Capillary: 118 mg/dL — ABNORMAL HIGH (ref 70–99)
Glucose-Capillary: 109 mg/dL — ABNORMAL HIGH (ref 70–99)
Glucose-Capillary: 152 mg/dL — ABNORMAL HIGH (ref 70–99)

## 2012-12-18 NOTE — Progress Notes (Signed)
[facility] PROGRESS NOTE  Norma Smith [id number] DOB: 1933/10/03 DOA: 12/13/2012  PCP: Cathlean Cower, MD  Brief Narrative: Pt is a 79 year old female with history of hypertension, moderate AS with HOCM, ( EF of 65-70% as per echo in June 2014, follows with Dr. Stanford Breed), diabetes mellitus on oral hypoglycemics, hypertension, iron deficiency anemia, CLL (currently stable and follows with Dr Juliann Mule) presented to the hospital with diffuse abdominal pain. She was found to have perforated appendicitis and was taken to the OR. Medicine was consulted for assistance with medical problems.   Assessment/Plan:  Perforated acute appendicitis with peritonitis  Status post laparoscopic appendectomy. Management per surgery.   Acute kidney injury  -Secondary to ARB, relative hypotension, SIRS -Resolved with hydration  Aortic stenosis and HOCM  Last echo 5 months back with EF of 65-70% with HOCM. Being followed by Dr Stanford Breed as OP. Patient is on metoprolol and amlodipine at home. Continue PRn metoprolol IV.  Resume oral medications as soon as able. Avoid afterload reducing agents like hydralazine. Aspirin on hold. Consult cardiology if cardiac status changes.  -Patient remains asymptomatic Diabetes mellitus  Patient on different oral hypoglycemics. Last A1c from May of 4.7. Okay Blood glucose control.  Patient remains on clear liquids, continue sliding scale insulin and follow - Hypertension  Blood pressure is currently stable. Continue when necessary IV Lopressor.  -Follow and resume by mouth meds at discharge as per renal recommendations or as clinically appropriate Iron deficiency anemia  Hemoglobin currently stable. Iron sulfate on hold.   CLL  Currently stable and not on any treatment. Followed in oncology as outpatient.   Hyperlipidemia  Lipitor on hold.   Code Status: Full code.  DVT Prophylaxis: SCD's    Family Communication: Discussed with patient.  Disposition Plan: Per  surgery. Will need telemetry if transferred.   Primary Service: General Surgery  Consultants: Nephrology  Procedures: Appendectomy  Antibiotics: Ertapenem 11/22  Subjective: Denies any complaints today, tolerating clears. Denies shortness of breath.   Objective: Vital Signs  Filed Vitals:   12/17/12 1500 12/17/12 1552 12/17/12 2049 12/18/12 0428  BP:  122/49 141/55 144/49  Pulse: 95 89 89 86  Temp:  97.6 F (36.4 C) 99.6 F (37.6 C) 98.3 F (36.8 C)  TempSrc:  Oral Oral Oral  Resp: 22 18 18 18   Height:  5' (1.524 m)    Weight:  72.9 kg (160 lb 11.5 oz)    SpO2: 93% 94% 94% 92%    Filed Weights   12/14/12 0200 12/15/12 0600 12/17/12 1552  Weight: 71 kg (156 lb 8.4 oz) 72.9 kg (160 lb 11.5 oz) 72.9 kg (160 lb 11.5 oz)    Intake/Output from previous day: 11/26 0701 - 11/27 0700 In: 1430 [P.O.:360; I.V.:1020; IV Piggyback:50] Out: 635 [Urine:425; Drains:210]  General appearance: alert, cooperative, appears stated age and no distress Resp: Diminished air entry at bases. Few crackles. Cardio: regular rate and rhythm, S1, S2 normal. Loud systolic murmur over precordium. No click, rub or gallop Abd: Covered in dressing with a drain.+ BS .  Extremities: extremities normal, atraumatic, no cyanosis or edema Neurologic: Alert and oriented x 3. No focal deficits  Lab Results:  Basic Metabolic Panel:  Recent Labs Lab 12/14/12 0345 12/15/12 0341 12/16/12 0340 12/17/12 0832 12/18/12 0420  NA 133* 135 134* 138 138  K 3.8 4.4 4.1 4.0 3.9  CL 98 105 105 107 107  CO2 25 22 21 22 24   GLUCOSE 141* 151* 139* 101* 112*  BUN  42* 43* 29* 19 18  CREATININE 1.37* 1.50* 1.12* 0.92 0.90  CALCIUM 8.5 7.6* 7.7* 8.3* 8.3*   Liver Function Tests:  Recent Labs Lab 12/13/12 1835 12/15/12 0341  AST 22 21  ALT 10 9  ALKPHOS 45 47  BILITOT 0.7 0.2*  PROT 6.8 5.1*  ALBUMIN 3.3* 2.1*    Recent Labs Lab 12/13/12 1835  LIPASE 12   CBC:  Recent Labs Lab 12/13/12 1835  12/14/12 0345 12/15/12 0341 12/16/12 0340 12/18/12 0420  WBC 20.4* 13.3* 13.0* 14.8* 13.4*  NEUTROABS 14.5*  --   --   --   --   HGB 10.5* 10.0* 8.4* 8.9* 8.3*  HCT 31.0* 29.4* 25.4* 27.0* 25.3*  MCV 95.7 93.6 95.5 96.4 95.1  PLT 216 175 174 213 275   CBG:  Recent Labs Lab 12/17/12 2004 12/18/12 0001 12/18/12 0426 12/18/12 0745 12/18/12 1152  GLUCAP 109* 128* 113* 124* 118*    Recent Results (from the past 240 hour(s))  URINE CULTURE     Status: None   Collection Time    12/13/12  7:39 PM      Result Value Range Status   Specimen Description URINE, CLEAN CATCH   Final   Special Requests NONE   Final   Culture  Setup Time     Final   Value: 12/14/2012 03:28     Performed at [facility]     Final   Value: 60,000 COLONIES/ML     Performed at [facility]   Culture     Final   Value: Multiple bacterial morphotypes present, none predominant. Suggest appropriate recollection if clinically indicated.     Performed at [facility]   Report Status 12/16/2012 FINAL   Final  MRSA PCR SCREENING     Status: None   Collection Time    12/14/12  1:30 AM      Result Value Range Status   MRSA by PCR NEGATIVE  NEGATIVE Final   Comment:            The GeneXpert MRSA Assay (FDA     approved for NASAL specimens     only), is one component of a     comprehensive MRSA colonization     surveillance program. It is not     intended to diagnose MRSA     infection nor to guide or     monitor treatment for     MRSA infections.      Studies/Results: Dg Abd Portable 1v  12/17/2012   CLINICAL DATA:  Postoperative abdominal distention.  EXAM: PORTABLE ABDOMEN - 1 VIEW  COMPARISON:  12/13/2012.  FINDINGS: Gaseous distension of large and small bowel is present, most compatible with postoperative ileus. There is hyperlucency over the liver shadow, likely representing a small amount of residual free air. There is no enteric tube present. No stool is  identified within the rectosigmoid. Contrast is present in the ascending colon.  IMPRESSION: 1. Surgical drain in the anatomic pelvis compatible with recent operation. 2. Hyperlucency over the liver shadow compatible with residual intra-abdominal free air. 3. Small bowel predominant gaseous distension of bowel most compatible with postoperative ileus.   Electronically Signed   By: Dereck Ligas M.D.   On: 12/17/2012 08:14    Medications:  Scheduled: . antiseptic oral rinse  15 mL Mouth Rinse QID  . bisacodyl  10 mg Rectal BID  . enoxaparin (LOVENOX) injection  40 mg Subcutaneous Q24H  .  ertapenem [facility]) IV  1 g Intravenous Q24H  . insulin aspart  0-15 Units Subcutaneous Q4H  . pantoprazole (PROTONIX) IV  40 mg Intravenous Daily   Continuous: . dextrose 5 % and 0.45 % NaCl with KCl 20 mEq/L 50 mL/hr at 12/18/12 [id number]   [id number], metoprolol, morphine injection, ondansetron (ZOFRAN) IV, ondansetron, oxyCODONE, phenol  Principal Problem:   Acute fulminating appendicitis with perforation and peritonitis Active Problems:   Hypertension   CLL (chronic lymphocytic leukemia)   Aortic stenosis, moderate   Hypertrophic obstructive cardiomyopathy(425.11)   AKI (acute kidney injury)    LOS: 5 days   Sheila Oats  [facility] Pager [phone] 12/18/2012, 2:08 PM  If 8PM-8AM, please contact night-coverage at [email or web address], password [facility]

## 2012-12-18 NOTE — Consult Note (Signed)
Patient Norma Smith      DOB: 1933/10/29      [id number]     Consult Note from the Palliative Medicine Team at [place] Requested by: Coralie Keens   PCP: Cathlean Cower, MD Reason for Consultation: Address Advanced      Phone [place].  Assessment of patients Current state: 79 yr old white female presented with acute onset abdomen pain was found to have ruptured appendix.  Patient underwent emergency laparoscopy with reasonably good out come.  Course has been complicated by ileus, anemia, renal failure which is resolved,  And pleural effusions not currently symptomatic.  Patient has a known history of Moderate AS with HCOM.  Family present to support Norma Smith in her desire to assert her overall code status as do not resuscitate.  She states if she can not maintain her independence or would be a "vegatable" she would want to go on and not have CPR or Life support initiated. I left a MOST form for the family to review to further specify her wishes for discharge.  She asserts that if she would to need a blood transfusion for this procedure she might consider this and would want the surgeons to discuss it with her.  She does not feel that she would want a feeding tube placed, stating "if she were that bad off it would not fit her plan for dignity and comfort".  She will review the options and complete the MoST form with her family .  If her post operative condition were to require alternative nutrition , would request this be discussed with her so that she can make decisions in these special circumstances.   Goals of Care: 1.  Code Status:   DNR   2. Scope of Treatment: Continue curative treatments and rehab therapies. Discuss all options with her and her family so that she can make her decision within the constructs that she has laid down to define what she feels is quality and dignity.   4. Disposition: To be determined.  She live at her home with local  assistance from her daughter.  She would be open to rehab if needed.   3. Symptom Management:    1. Pain: Patient feels her current pain is managed with the prescribed medications.  Current pain at a 3/10 she is going to try some oral oxycodone 2. Bowel Regimen: per surgery 3. Delirium: high risk only due to age and complexity of illness, no prior dementia or sensory deficits that could predisopse 4. Fever: Tylenol 5. Nausea/Vomiting: prn medications, surgery monitoring for resolution of ileus.  4. 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  She also worked as a Network engineer.  5. Spiritual: We did not broach her spiritual needs at this visit will revisit during next interaction        Patient Documents Completed or Given: Document Given Completed  Advanced Directives Pkt    MOST 1   DNR    Gone from My Sight    Hard Choices      Brief HPI: 79 yr old white female admitted with severe abdominal pain noted to have elevated white count.  CT found free air and perforated viscus either appendix or bowel.  Emergent laparoscopy was performed. We were asked to assist with goals of care specifically family wanted advanced care planning addressed and honored.   ROS: mild abdominal discomfort, no  current nausea    PMH:  Past Medical History  Diagnosis Date  . Skin cancer   . Iron deficiency 12/01/2010  . Allergy   . Diabetes mellitus   . Hyperlipidemia   . Hypertension   . Anemia   . Osteopenia   . Dry skin dermatitis 12/01/2010  . CLL (chronic lymphoblastic leukemia) 12/01/2010  . Aortic stenosis, moderate 06/16/2011     PSH: Past Surgical History  Procedure Laterality Date  . Breast biopsy  1976  . Cataract extraction    . Laparoscopic appendectomy N/A 12/13/2012    Procedure: APPENDECTOMY LAPAROSCOPIC;  Surgeon: Odis Hollingshead, MD;  Location: [facility];  Service: General;  Laterality: N/A;    I have reviewed the [place] and SH and  If appropriate update it with new information. Allergies  Allergen Reactions  . Codeine Other (See Comments)    Passed out  . Penicillins Rash   Scheduled Meds: . antiseptic oral rinse  15 mL Mouth Rinse QID  . bisacodyl  10 mg Rectal BID  . enoxaparin (LOVENOX) injection  40 mg Subcutaneous Q24H  . ertapenem (INVANZ) IV  1 g Intravenous Q24H  . insulin aspart  0-15 Units Subcutaneous Q4H  . pantoprazole (PROTONIX) IV  40 mg Intravenous Daily   Continuous Infusions: . dextrose 5 % and 0.45 % NaCl with KCl 20 mEq/L 50 mL/hr at 12/18/12 0525   PRN Meds:.acetaminophen, metoprolol, morphine injection, ondansetron (ZOFRAN) IV, ondansetron, oxyCODONE, phenol    BP 153/53  Pulse 86  Temp(Src) 98.1 F (36.7 C) (Oral)  Resp 18  Ht 5' (1.524 m)  Wt 72.9 kg (160 lb 11.5 oz)  BMI 31.39 kg/m2  SpO2 95%   PPS: 50%  Intake/Output Summary (Last 24 hours) at 12/18/12 1719 Last data filed at 12/18/12 1500  Gross per 24 hour  Intake   1780 ml  Output    500 ml  Net   1280 ml   LBM: 11/26                     Physical Exam:  General: no acute distress, has some post op surgical pain,  HEENT:  PERRL, EOMI, anicteric, pale, mm dry Chest:   Decreased but clear anteriorly, no RRW CVS: regular, rate and rhythm,  II/VI high pitch systolic ejection murmur Abdomen:mildly distended with diffuse tenderness but no guarding or rebound Ext: warm, trace not pitting edema, no mottling Neuro:awake, alert oriented , CN appear intact  Labs: CBC    Component Value Date/Time   WBC 13.4* 12/18/2012 0420   WBC 8.4 12/01/2012 0949   RBC 2.66* 12/18/2012 0420   RBC 3.38* 12/01/2012 0949   HGB 8.3* 12/18/2012 0420   HGB 10.7* 12/01/2012 0949   HCT 25.3* 12/18/2012 0420   HCT 32.7* 12/01/2012 0949   PLT 275 12/18/2012 0420   PLT 175 12/01/2012 0949   MCV 95.1 12/18/2012 0420   MCV 96.7 12/01/2012 0949   MCH 31.2 12/18/2012 0420   MCH 31.7 12/01/2012 0949    MCHC 32.8 12/18/2012 0420   MCHC 32.8 12/01/2012 0949   RDW 14.0 12/18/2012 0420   RDW 14.4 12/01/2012 0949   LYMPHSABS 4.5* 12/13/2012 1835   LYMPHSABS 3.5* 12/01/2012 0949   MONOABS 1.4* 12/13/2012 1835   MONOABS 0.5 12/01/2012 0949   EOSABS 0.0 12/13/2012 1835   EOSABS 0.1 12/01/2012 0949   BASOSABS 0.0 12/13/2012 1835   BASOSABS 0.1 12/01/2012 0949      CMP  Component Value Date/Time   NA 138 12/18/2012 0420   NA 142 12/01/2012 0949   K 3.9 12/18/2012 0420   K 4.3 12/01/2012 0949   CL 107 12/18/2012 0420   CL 105 12/14/2011 0851   CO2 24 12/18/2012 0420   CO2 28 12/01/2012 0949   GLUCOSE 112* 12/18/2012 0420   GLUCOSE 103 12/01/2012 0949   GLUCOSE 100* 12/14/2011 0851   BUN 18 12/18/2012 0420   BUN 20.5 12/01/2012 0949   CREATININE 0.90 12/18/2012 0420   CREATININE 1.1 12/01/2012 0949   CALCIUM 8.3* 12/18/2012 0420   CALCIUM 10.1 12/01/2012 0949   PROT 5.1* 12/15/2012 0341   PROT 6.9 12/01/2012 0949   ALBUMIN 2.1* 12/15/2012 0341   ALBUMIN 3.7 12/01/2012 0949   AST 21 12/15/2012 0341   AST 22 12/01/2012 0949   ALT 9 12/15/2012 0341   ALT 11 12/01/2012 0949   ALKPHOS 47 12/15/2012 0341   ALKPHOS 50 12/01/2012 0949   BILITOT 0.2* 12/15/2012 0341   BILITOT 0.53 12/01/2012 0949   GFRNONAA 60* 12/18/2012 0420   GFRAA 70* 12/18/2012 0420    Chest Xray Reviewed/Impressions: atelectasis with consolidation and possible pleural effusions noted on 11/24  CT scan of the Head Reviewed/Impressions:  11/22 Findings consistent with perforated bowel in the right lower  quadrant with free air and free fluid in the right lower quadrant  probably either arising from the appendix or distal ileal small  bowel loops. The appendix is dilated and enlarged. There are also  inflamed thick wall ileal small bowel loops     Time In Time Out Total Time Spent with Patient Total Overall Time  435 pm 519 pm 45 min 45 min    Greater than 50%  of this time was spent  counseling and coordinating care related to the above assessment and plan.   Annielee Jemmott L. Lovena Le, MD MBA The Palliative Medicine Team at [facility] Phone: [phone] Pager: [phone]

## 2012-12-18 NOTE — Progress Notes (Signed)
5 Days Post-Op  Subjective: Tolerated some clears this am.  No nausea or vomiting.  No BM, +flatus  Objective: Vital signs in last 24 hours: Temp:  [97.6 F (36.4 C)-99.6 F (37.6 C)] 98.3 F (36.8 C) (11/27 0428) Pulse Rate:  [86-96] 86 (11/27 0428) Resp:  [15-22] 18 (11/27 0428) BP: (122-157)/(48-58) 144/49 mmHg (11/27 0428) SpO2:  [92 %-97 %] 92 % (11/27 0428) Weight:  [160 lb 11.5 oz (72.9 kg)] 160 lb 11.5 oz (72.9 kg) (11/26 1552) Last BM Date: 12/18/12  Intake/Output from previous day: 11/26 0701 - 11/27 0700 In: 1430 [P.O.:360; I.V.:1020; IV Piggyback:50] Out: 635 [Urine:425; Drains:210] Intake/Output this shift: Total I/O In: 120 [P.O.:120] Out: 50 [Drains:50]  General appearance: alert, cooperative and no distress Resp: nonlabored GI: soft, no significant tenderness, moderate distension, wounds okay, JP serous  Lab Results:   Recent Labs  12/16/12 0340 12/18/12 0420  WBC 14.8* 13.4*  HGB 8.9* 8.3*  HCT 27.0* 25.3*  PLT 213 275   BMET  Recent Labs  12/17/12 0832 12/18/12 0420  NA 138 138  K 4.0 3.9  CL 107 107  CO2 22 24  GLUCOSE 101* 112*  BUN 19 18  CREATININE 0.92 0.90  CALCIUM 8.3* 8.3*   PT/INR No results found for this basename: LABPROT, INR,  in the last 72 hours ABG No results found for this basename: PHART, PCO2, PO2, HCO3,  in the last 72 hours  Studies/Results: Dg Abd Portable 1v  12/17/2012   CLINICAL DATA:  Postoperative abdominal distention.  EXAM: PORTABLE ABDOMEN - 1 VIEW  COMPARISON:  12/13/2012.  FINDINGS: Gaseous distension of large and small bowel is present, most compatible with postoperative ileus. There is hyperlucency over the liver shadow, likely representing a small amount of residual free air. There is no enteric tube present. No stool is identified within the rectosigmoid. Contrast is present in the ascending colon.  IMPRESSION: 1. Surgical drain in the anatomic pelvis compatible with recent operation. 2. Hyperlucency  over the liver shadow compatible with residual intra-abdominal free air. 3. Small bowel predominant gaseous distension of bowel most compatible with postoperative ileus.   Electronically Signed   By: Dereck Ligas M.D.   On: 12/17/2012 08:14    Anti-infectives: Anti-infectives   Start     Dose/Rate Route Frequency Ordered Stop   12/14/12 2100  ertapenem (INVANZ) 1 g in sodium chloride 0.9 % 50 mL IVPB     1 g 100 mL/hr over 30 Minutes Intravenous Every 24 hours 12/14/12 0148     12/13/12 2130  [MAR Hold]  ertapenem (INVANZ) 1 g in sodium chloride 0.9 % 50 mL IVPB     (On MAR Hold since 12/13/12 2152)  Comments:  stat   1 g 100 mL/hr over 30 Minutes Intravenous  Once 12/13/12 2119 12/13/12 2208      Assessment/Plan: s/p Procedure(s): APPENDECTOMY LAPAROSCOPIC (N/A) I would like to advance her diet but given her xrays yesterday (still with gaseous distension of small bowel) and abdominal distension, I would keep on clears today.  awaiting more reliable bowel function  LOS: 5 days    Norma Smith DAVID 12/18/2012

## 2012-12-19 DIAGNOSIS — R1084 Generalized abdominal pain: Secondary | ICD-10-CM

## 2012-12-19 LAB — GLUCOSE, CAPILLARY
Glucose-Capillary: 99 mg/dL (ref 70–99)
Glucose-Capillary: 113 mg/dL — ABNORMAL HIGH (ref 70–99)
Glucose-Capillary: 127 mg/dL — ABNORMAL HIGH (ref 70–99)
Glucose-Capillary: 132 mg/dL — ABNORMAL HIGH (ref 70–99)
Glucose-Capillary: 76 mg/dL (ref 70–99)

## 2012-12-19 MED ORDER — MORPHINE SULFATE (CONCENTRATE) 10 MG /0.5 ML PO SOLN
5.0000 mg | ORAL | Status: DC | PRN
  Administered 2012-12-19 – 2012-12-20 (×2): 5 mg via ORAL
  Filled 2012-12-19 (×2): qty 0.5

## 2012-12-19 MED ORDER — INSULIN ASPART 100 UNIT/ML ~~LOC~~ SOLN
0.0000 [IU] | Freq: Three times a day (TID) | SUBCUTANEOUS | Status: DC
  Administered 2012-12-21: 2 [IU] via SUBCUTANEOUS

## 2012-12-19 NOTE — Progress Notes (Signed)
Occupational Therapy Treatment Patient Details Name: Norma Smith MRN: [id number] DOB: 1933-08-22 Today's Date: 12/19/2012 Time: [id number] OT Time Calculation (min): 18 min  OT Assessment / Plan / Recommendation  History of present illness Chief Complaint: Severe, persistent lower abdominal pain. perforated viscous/peritonitis. s/p laparoscopic appendectomy on 12/13/12   OT comments  Pt with improving activity tolerance.  She requires min A to min guard assist for BADLs.  Did lose balance x 1 requiring min A to recover  Follow Up Recommendations  SNF    Barriers to Discharge       Equipment Recommendations  3 in 1 bedside comode    Recommendations for Other Services    Frequency Min 2X/week   Progress towards OT Goals Progress towards OT goals: Progressing toward goals  Plan Discharge plan remains appropriate    Precautions / Restrictions Precautions Precautions: Fall Precaution Comments: abd. drain,    Pertinent Vitals/Pain     ADL  Grooming: Wash/dry hands;Wash/dry face;Brushing hair;Min guard Where Assessed - Grooming: Unsupported standing Toilet Transfer: Minimal assistance Toilet Transfer Method: Sit to stand;Stand pivot Science writer: Comfort height toilet;Grab bars Toileting - Clothing Manipulation and Hygiene: Min guard Where Assessed - Best boy and Hygiene: Sit to stand from 3-in-1 or toilet Equipment Used: Rolling walker Transfers/Ambulation Related to ADLs: min A ADL Comments: min A for balance    OT Diagnosis:    OT Problem List:   OT Treatment Interventions:     OT Goals(current goals can now be found in the care plan section) Acute Rehab OT Goals Patient Stated Goal: decreased pain OT Goal Formulation: With patient Time For Goal Achievement: 12/30/12 Potential to Achieve Goals: Good ADL Goals Pt Will Perform Lower Body Bathing: with min assist;with adaptive equipment;sit to/from stand Pt Will Perform Lower Body  Dressing: with min assist;with adaptive equipment;sit to/from stand Pt Will Transfer to Toilet: with min guard assist;ambulating;bedside commode Pt Will Perform Toileting - Clothing Manipulation and hygiene: with min guard assist;sit to/from stand Additional ADL Goal #1: pt will complete ub adls with set up and initiate at least one rest break as needed during adls for energy conservation  Visit Information  Last OT Received On: 12/19/12 Assistance Needed: +1 History of Present Illness: Chief Complaint: Severe, persistent lower abdominal pain. perforated viscous/peritonitis. s/p laparoscopic appendectomy on 12/13/12    Subjective Data      Prior Functioning       Cognition  Cognition Arousal/Alertness: Awake/alert Behavior During Therapy: WFL for tasks assessed/performed Overall Cognitive Status: Within Functional Limits for tasks assessed    Mobility  Bed Mobility Bed Mobility: Rolling Left;Left Sidelying to Sit;Sitting - Scoot to Edge of Bed Rolling Left: 5: Supervision;With rail Left Sidelying to Sit: 4: Min assist;With rails Details for Bed Mobility Assistance: Assist to lift shoulders from bed Transfers Transfers: Sit to Stand;Stand to Sit Sit to Stand: 4: Min guard;With upper extremity assist;From bed;From toilet Stand to Sit: 4: Min guard;With upper extremity assist;To toilet;To chair/3-in-1 Details for Transfer Assistance: cues for safety.  Pt.     Exercises      Balance Balance Balance Assessed: Yes Dynamic Standing Balance Dynamic Standing - Balance Support: No upper extremity supported Dynamic Standing - Level of Assistance: 5: Stand by assistance Dynamic Standing - Balance Activities: Other (comment) (grooming at sink) Dynamic Standing - Comments: 5   End of Session OT - End of Session Equipment Utilized During Treatment: Rolling walker Activity Tolerance: Patient tolerated treatment well Patient left: in chair;with call bell/phone within  reach;with  nursing/sitter in room Nurse Communication: Mobility status  [place], Adanely Reynoso M 12/19/2012, 12:37 PM

## 2012-12-19 NOTE — Progress Notes (Signed)
Clinical Social Work Department BRIEF PSYCHOSOCIAL ASSESSMENT 12/19/2012  Patient:  Norma Smith, Norma Smith     Account Number:  [id number]     Admit date:  12/13/2012  Clinical Social Worker:  Renold Genta  Date/Time:  12/19/2012 04:10 PM  Referred by:  Physician  Date Referred:  12/19/2012 Referred for  SNF Placement   Other Referral:   Interview type:  Patient Other interview type:   and family at bedside    PSYCHOSOCIAL DATA Living Status:  ALONE Admitted from facility:   Level of care:   Primary support name:  Glean Salvo (daughter) ph#: [phone] Primary support relationship to patient:  CHILD, ADULT Degree of support available:   good    CURRENT CONCERNS Current Concerns  Post-Acute Placement   Other Concerns:    SOCIAL WORK ASSESSMENT / PLAN CSW received consult for SNF placement.   Assessment/plan status:  Information/Referral to [employer] Other assessment/ plan:   Information/referral to community resources:   CSW completed FL2 and faxed information out to [facility] - provided patient & family at bedside with bed offers. Daughters expressed interest in [facility] and plans to tour over weekend.    PATIENT'S/FAMILY'S RESPONSE TO PLAN OF CARE: Patient seemed agreeable with plan for SNF - patient informed CSW that she lives at [facility] [address] ([place]). Patient had just finished working with PT (was able to walk halfway down the hallway). Daughters headed to [employer] now to check it out and will report back to patient. Patient in the meantime to read over the copy of the MOST form provided by Dr. Lovena Le. CSW will follow-up Monday.       Winfred Leeds, [facility] Clinical Social Worker cell #: [phone]

## 2012-12-19 NOTE — Progress Notes (Signed)
Physical Therapy Treatment Patient Details Name: Norma Smith MRN: [id number] DOB: [birth year] Today's Date: 12/19/2012 Time: [id number] PT Time Calculation (min): 17 min  PT Assessment / Plan / Recommendation  History of Present Illness Chief Complaint: Severe, persistent lower abdominal pain. perforated viscous/peritonitis. s/p laparoscopic appendectomy on 12/13/12   PT Comments   Progressing with mobility overall-still having difficulty with bed mobility. Pt c/o back pain this session-reports it is chronic.   Follow Up Recommendations  SNF     Does the patient have the potential to tolerate intense rehabilitation     Barriers to Discharge        Equipment Recommendations  None recommended by PT    Recommendations for Other Services OT consult  Frequency Min 3X/week   Progress towards PT Goals Progress towards PT goals: Progressing toward goals  Plan Current plan remains appropriate    Precautions / Restrictions Precautions Precautions: Fall Precaution Comments: abd. drain,  Restrictions Weight Bearing Restrictions: No   Pertinent Vitals/Pain 5/10 back pain.     Mobility  Bed Mobility Bed Mobility: Left Sidelying to Sit;Sit to Supine Rolling Left: 5: Supervision Left Sidelying to Sit: 4: Min assist Sit to Supine: 3: Mod assist Details for Bed Mobility Assistance: Assist for trunk and bil LEs-increased assist for back to bed Transfers Transfers: Sit to Stand;Stand to Sit Sit to Stand: From toilet;From bed Stand to Sit: 4: Min guard;To bed;To toilet Details for Transfer Assistance: VCs safety, hand placement Ambulation/Gait Ambulation/Gait Assistance: 4: Min assist Ambulation Distance (Feet): 200 Feet Assistive device: Rolling walker Ambulation/Gait Assistance Details: Assist to stabilize throghout ambulation.  Gait Pattern: Step-through pattern;Decreased stride length;Trunk flexed    Exercises     PT Diagnosis:    PT Problem List:   PT Treatment  Interventions:     PT Goals (current goals can now be found in the care plan section) Acute Rehab PT Goals Patient Stated Goal: decreased pain  Visit Information  Last PT Received On: 12/19/12 Assistance Needed: +1 History of Present Illness: Chief Complaint: Severe, persistent lower abdominal pain. perforated viscous/peritonitis. s/p laparoscopic appendectomy on 12/13/12    Subjective Data  Patient Stated Goal: decreased pain   Cognition  Cognition Arousal/Alertness: Awake/alert Behavior During Therapy: WFL for tasks assessed/performed Overall Cognitive Status: Within Functional Limits for tasks assessed    Balance  Balance Balance Assessed: Yes Dynamic Standing Balance Dynamic Standing - Balance Support: No upper extremity supported Dynamic Standing - Level of Assistance: 5: Stand by assistance Dynamic Standing - Balance Activities: Other (comment) (grooming at sink) Dynamic Standing - Comments: 5  End of Session PT - End of Session Activity Tolerance: Patient limited by pain Patient left: in bed;with call bell/phone within reach;with family/visitor present   GP     Weston Anna, MPT Pager: [phone]

## 2012-12-19 NOTE — Progress Notes (Signed)
Patient has ambulated in hall 3 times during shift.  Tolerated very well.  Will continue to monitor and encourage ambulation.

## 2012-12-19 NOTE — Progress Notes (Signed)
Clinical Social Work Department CLINICAL SOCIAL WORK PLACEMENT NOTE 12/19/2012  Patient:  Norma Smith, Norma Smith  Account Number:  [id number] Admit date:  12/13/2012  Clinical Social Worker:  Renold Genta  Date/time:  12/19/2012 04:16 PM  Clinical Social Work is seeking post-discharge placement for this patient at the following level of care:   SKILLED NURSING   (*CSW will update this form in Epic as items are completed)   12/19/2012  Patient/family provided with [facility] Department of Clinical Social Work's list of facilities offering this level of care within the geographic area requested by the patient (or if unable, by the patient's family).  12/19/2012  Patient/family informed of their freedom to choose among providers that offer the needed level of care, that participate in Medicare, Medicaid or managed care program needed by the patient, have an available bed and are willing to accept the patient.  12/19/2012  Patient/family informed of [facility]' ownership interest in [facility], as well as of the fact that they are under no obligation to receive care at this facility.  PASARR submitted to EDS on 12/19/2012 PASARR number received from EDS on 12/19/2012  FL2 transmitted to all facilities in geographic area requested by pt/family on  12/19/2012 FL2 transmitted to all facilities within larger geographic area on   Patient informed that his/her managed care company has contracts with or will negotiate with  certain facilities, including the following:     Patient/family informed of bed offers received:  12/19/2012 Patient chooses bed at  Physician recommends and patient chooses bed at    Patient to be transferred to  on   Patient to be transferred to facility by   The following physician request were entered in Epic:   Additional Comments:   Winfred Leeds, [place] Worker cell #: [phone]

## 2012-12-19 NOTE — Progress Notes (Signed)
Patient [id number] [place]      DOB: 1933-07-26      [id number]  Patient seen.  Thought she could eat but not feeling much like it.   Patient states the liquid pain (oxycodone) med is making her hallucinate.  Will change her to morphine liquid.   She has a codeine allergy but has tolerated IV Morphine.  Full note to follow.  Makyna Niehoff L. Lovena Le, MD MBA The Palliative Medicine Team at [facility] Phone: [phone] Pager: [phone]

## 2012-12-19 NOTE — Progress Notes (Signed)
Patient ID: Norma Smith, female   DOB: [birth year], 79 y.o.   MRN: [id number]  [facility] - Progress Note  POD# 6  Subjective: Patient in bed after up in chair.  Tolerating CL diet.  Mild pain.  Objective: Vital signs in last 24 hours: Temp:  [98.1 F (36.7 C)-98.9 F (37.2 C)] 98.1 F (36.7 C) (11/28 0511) Pulse Rate:  [85-86] 85 (11/28 0511) Resp:  [16-18] 18 (11/28 0511) BP: (144-153)/(48-55) 144/48 mmHg (11/28 0511) SpO2:  [95 %-96 %] 96 % (11/28 0511) Last BM Date: 12/18/12  Intake/Output from previous day: 11/27 0701 - 11/28 0700 In: 2221.7 [P.O.:840; I.V.:1331.7; IV Piggyback:50] Out: 254 [Drains:254]  Exam: HEENT - clear, not icteric Neck - soft Chest - clear bilaterally Cor - RRR, no murmur Abd - soft, mild distension; BS present; wound clear and dry; JP drain with thin serous fluid Ext - no significant edema Neuro - grossly intact, no focal deficits  Lab Results:   Recent Labs  12/18/12 0420  WBC 13.4*  HGB 8.3*  HCT 25.3*  PLT 275     Recent Labs  12/17/12 0832 12/18/12 0420  NA 138 138  K 4.0 3.9  CL 107 107  CO2 22 24  GLUCOSE 101* 112*  BUN 19 18  CREATININE 0.92 0.90  CALCIUM 8.3* 8.3*    Studies/Results: No results found.  Assessment / Plan: 1.  Status post appendectomy for perforated appendicitis  IV Invanz  Will advance to full liquid diet  OOB, ambulate in halls  Earnstine Regal, MD, [facility] Office: [phone]  12/19/2012

## 2012-12-19 NOTE — Progress Notes (Addendum)
[facility] PROGRESS NOTE  Perley Omelia [id number] DOB: 11-Jun-1933 DOA: 12/13/2012  PCP: Cathlean Cower, MD  Brief Narrative: Pt is a 79 year old female with history of hypertension, moderate AS with HOCM, ( EF of 65-70% as per echo in June 2014, follows with Dr. Stanford Breed), diabetes mellitus on oral hypoglycemics, hypertension, iron deficiency anemia, CLL (currently stable and follows with Dr Juliann Mule) presented to the hospital with diffuse abdominal pain. She was found to have perforated appendicitis and was taken to the OR. Medicine was consulted for assistance with medical problems.   Assessment/Plan:  Perforated acute appendicitis with peritonitis  Status post laparoscopic appendectomy. Management per surgery.   Acute kidney injury  -Secondary to ARB, relative hypotension, SIRS -Resolved with hydration  Aortic stenosis and HOCM  Last echo 5 months back with EF of 65-70% with HOCM. Being followed by Dr Stanford Breed as OP. Patient is on metoprolol and amlodipine at home. Continue PRn metoprolol IV.  Resume oral medications as soon as able. Avoid afterload reducing agents like hydralazine. Aspirin on hold. -Patient remains asymptomatic -She is tolerating full is present this time, will DC IV fluids continue to monitor fluid status Diabetes mellitus  Patient on different oral hypoglycemics. Last A1c from May of 4.7. Okay Blood glucose control.  Patient remains on clear liquids, continue sliding scale insulin and follow - Hypertension  Blood pressure is currently stable. Continue when necessary IV Lopressor.  -Follow and resume by mouth meds at discharge as per renal recommendations or as clinically appropriate Iron deficiency anemia  Hemoglobin currently stable. Iron sulfate on hold.   CLL  Currently stable and not on any treatment. Followed in oncology as outpatient.   Hyperlipidemia  Lipitor on hold.   Code Status: Full code.  DVT Prophylaxis: SCD's    Family Communication:  Discussed with patient.  Disposition Plan: Per surgery. Will need telemetry if transferred.   Primary Service: General Surgery  Consultants: Nephrology  Procedures: Appendectomy  Antibiotics: Ertapenem 11/22  Subjective: Denies any complaints today. Denies chest pain and no shortness of breath.  Objective: Vital Signs  Filed Vitals:   12/18/12 0428 12/18/12 1446 12/18/12 2105 12/19/12 0511  BP: 144/49 153/53 150/55 144/48  Pulse: 86 86 86 85  Temp: 98.3 F (36.8 C) 98.1 F (36.7 C) 98.9 F (37.2 C) 98.1 F (36.7 C)  TempSrc: Oral Oral Oral Oral  Resp: 18 18 16 18   Height:      Weight:      SpO2: 92% 95% 96% 96%    Filed Weights   12/14/12 0200 12/15/12 0600 12/17/12 1552  Weight: 71 kg (156 lb 8.4 oz) 72.9 kg (160 lb 11.5 oz) 72.9 kg (160 lb 11.5 oz)    Intake/Output from previous day: 11/27 0701 - 11/28 0700 In: 2221.7 [P.O.:840; I.V.:1331.7; IV Piggyback:50] Out: 254 [Drains:254]  General appearance: alert, cooperative, appears stated age and no distress Resp: Diminished air entry at bases.No crackles. Cardio: regular rate and rhythm, S1, S2 normal. Loud systolic murmur over precordium. No click, rub or gallop Abd: Covered in dressing with a drain.+ BS .  Extremities: extremities normal, atraumatic, no cyanosis or edema Neurologic: Alert and oriented x 3. No focal deficits  Lab Results:  Basic Metabolic Panel:  Recent Labs Lab 12/14/12 0345 12/15/12 0341 12/16/12 0340 12/17/12 0832 12/18/12 0420  NA 133* 135 134* 138 138  K 3.8 4.4 4.1 4.0 3.9  CL 98 105 105 107 107  CO2 25 22 21 22 24   GLUCOSE 141* 151*  139* 101* 112*  BUN 42* 43* 29* 19 18  CREATININE 1.37* 1.50* 1.12* 0.92 0.90  CALCIUM 8.5 7.6* 7.7* 8.3* 8.3*   Liver Function Tests:  Recent Labs Lab 12/13/12 1835 12/15/12 0341  AST 22 21  ALT 10 9  ALKPHOS 45 47  BILITOT 0.7 0.2*  PROT 6.8 5.1*  ALBUMIN 3.3* 2.1*    Recent Labs Lab 12/13/12 1835  LIPASE 12    CBC:  Recent Labs Lab 12/13/12 1835 12/14/12 0345 12/15/12 0341 12/16/12 0340 12/18/12 0420  WBC 20.4* 13.3* 13.0* 14.8* 13.4*  NEUTROABS 14.5*  --   --   --   --   HGB 10.5* 10.0* 8.4* 8.9* 8.3*  HCT 31.0* 29.4* 25.4* 27.0* 25.3*  MCV 95.7 93.6 95.5 96.4 95.1  PLT 216 175 174 213 275   CBG:  Recent Labs Lab 12/18/12 1715 12/18/12 1953 12/18/12 2340 12/19/12 0414 12/19/12 0724  GLUCAP 109* 152* 88 99 113*    Recent Results (from the past 240 hour(s))  URINE CULTURE     Status: None   Collection Time    12/13/12  7:39 PM      Result Value Range Status   Specimen Description URINE, CLEAN CATCH   Final   Special Requests NONE   Final   Culture  Setup Time     Final   Value: 12/14/2012 03:28     Performed at [facility]     Final   Value: 60,000 COLONIES/ML     Performed at [facility]   Culture     Final   Value: Multiple bacterial morphotypes present, none predominant. Suggest appropriate recollection if clinically indicated.     Performed at [facility]   Report Status 12/16/2012 FINAL   Final  MRSA PCR SCREENING     Status: None   Collection Time    12/14/12  1:30 AM      Result Value Range Status   MRSA by PCR NEGATIVE  NEGATIVE Final   Comment:            The GeneXpert MRSA Assay (FDA     approved for NASAL specimens     only), is one component of a     comprehensive MRSA colonization     surveillance program. It is not     intended to diagnose MRSA     infection nor to guide or     monitor treatment for     MRSA infections.      Studies/Results: No results found.  Medications:  Scheduled: . antiseptic oral rinse  15 mL Mouth Rinse QID  . enoxaparin (LOVENOX) injection  40 mg Subcutaneous Q24H  . ertapenem (INVANZ) IV  1 g Intravenous Q24H  . insulin aspart  0-15 Units Subcutaneous Q4H  . pantoprazole (PROTONIX) IV  40 mg Intravenous Daily   Continuous: . dextrose 5 % and 0.45 % NaCl with KCl 20  mEq/L 50 mL/hr at 12/18/12 2133   [id number], metoprolol, morphine injection, ondansetron (ZOFRAN) IV, ondansetron, oxyCODONE, phenol  Principal Problem:   Acute fulminating appendicitis with perforation and peritonitis Active Problems:   Hypertension   CLL (chronic lymphocytic leukemia)   Aortic stenosis, moderate   Hypertrophic obstructive cardiomyopathy(425.11)   AKI (acute kidney injury)    LOS: 6 days   Sheila Oats  [facility] Pager [phone] 12/19/2012, 10:39 AM  If 8PM-8AM, please contact night-coverage at [email or web address], password [facility]

## 2012-12-20 LAB — GLUCOSE, CAPILLARY
Glucose-Capillary: 93 mg/dL (ref 70–99)
Glucose-Capillary: 96 mg/dL (ref 70–99)
Glucose-Capillary: 158 mg/dL — ABNORMAL HIGH (ref 70–99)

## 2012-12-20 MED ORDER — ACETAMINOPHEN 325 MG PO TABS
650.0000 mg | ORAL_TABLET | ORAL | Status: DC | PRN
Start: 2012-12-20 — End: 2012-12-22
  Administered 2012-12-20 – 2012-12-22 (×4): 650 mg via ORAL
  Filled 2012-12-20 (×4): qty 2

## 2012-12-20 MED ORDER — PANTOPRAZOLE SODIUM 40 MG PO TBEC
40.0000 mg | DELAYED_RELEASE_TABLET | Freq: Every day | ORAL | Status: DC
  Administered 2012-12-20 – 2012-12-22 (×3): 40 mg via ORAL
  Filled 2012-12-20 (×3): qty 1

## 2012-12-20 MED ORDER — DIPHENHYDRAMINE HCL 50 MG/ML IJ SOLN
12.5000 mg | Freq: Once | INTRAMUSCULAR | Status: AC
  Administered 2012-12-20: 18:00:00 12.5 mg via INTRAVENOUS
  Filled 2012-12-20: qty 1

## 2012-12-20 NOTE — Progress Notes (Addendum)
Patient [id number] [place]      DOB: Jul 15, 1933      [id number]  Called by nursing, patient was given roxanol about 430 pm called RN to room with itching on back .  Found to have hives on her back. No respiratory distress, just itching.   Will Discontinue Roxanol.  Patient may tolerate just tylenol at this point.  She relates  Hallucinations, visual with oxyir.  Benadryl 12.5 mg IV x1 ordered.  Will update surgery.  Carmesha Morocco L. Lovena Le, MD MBA The Palliative Medicine Team at [facility] Phone: [phone] Pager: [phone]

## 2012-12-20 NOTE — Progress Notes (Signed)
7 Days Post-Op  Subjective: Feeling better.  Tolerating diet. ambulatory  Objective: Vital signs in last 24 hours: Temp:  [98.4 F (36.9 C)-99.1 F (37.3 C)] 98.4 F (36.9 C) (11/29 0455) Pulse Rate:  [82-90] 90 (11/29 0455) Resp:  [16-18] 18 (11/29 0455) BP: (124-155)/(47-76) 137/76 mmHg (11/29 0455) SpO2:  [93 %-98 %] 93 % (11/29 0455) Last BM Date: 12/19/12  Intake/Output from previous day: 11/28 0701 - 11/29 0700 In: 730 [P.O.:680; IV Piggyback:50] Out: 545 [Urine:450; Drains:95] Intake/Output this shift: Total I/O In: 120 [P.O.:120] Out: -   General appearance: alert, cooperative and no distress Resp: nonlabored Cardio: normal rate, regular GI: soft, nontender, ND, JP serous  Lab Results:   Recent Labs  12/18/12 0420  WBC 13.4*  HGB 8.3*  HCT 25.3*  PLT 275   BMET  Recent Labs  12/18/12 0420  NA 138  K 3.9  CL 107  CO2 24  GLUCOSE 112*  BUN 18  CREATININE 0.90  CALCIUM 8.3*   PT/INR No results found for this basename: LABPROT, INR,  in the last 72 hours ABG No results found for this basename: PHART, PCO2, PO2, HCO3,  in the last 72 hours  Studies/Results: No results found.  Anti-infectives: Anti-infectives   Start     Dose/Rate Route Frequency Ordered Stop   12/14/12 2100  ertapenem [facility]) 1 g in sodium chloride 0.9 % 50 mL IVPB     1 g 100 mL/hr over 30 Minutes Intravenous Every 24 hours 12/14/12 0148     12/13/12 2130  [MAR Hold]  ertapenem (INVANZ) 1 g in sodium chloride 0.9 % 50 mL IVPB     (On MAR Hold since 12/13/12 2152)  Comments:  stat   1 g 100 mL/hr over 30 Minutes Intravenous  Once 12/13/12 2119 12/13/12 2208      Assessment/Plan: s/p Procedure(s): APPENDECTOMY LAPAROSCOPIC (N/A) diet as tolerated.  remove drain.  dispo planning for SNF  LOS: 7 days    Norma Smith 12/20/2012

## 2012-12-20 NOTE — Progress Notes (Signed)
Patient states that itch is better. Will continue to monitor. Remer Macho RN

## 2012-12-20 NOTE — Progress Notes (Signed)
Patient [id number] Norma Smith      DOB: 1933/05/18      [id number]  [name] just got back from bathroom.  States pain better controlled and no hallucinations with morphine po.  Offered emotional support.  Will continue to follow with you.  [name], MD MBA The Palliative Medicine Team at [facility] Phone: [phone] Pager: [phone]

## 2012-12-20 NOTE — Progress Notes (Signed)
Patient [id number] [place]      DOB: Jun 12, 1933      [id number]   Palliative Medicine Team at [facility] Progress Note    Subjective: Patient was found lying in bed trying to eat a full meal. She states she thought she was hungry but doesn't really feel like eating. She relates her pain is moderate and that the pain medication has been causing her to have hallucinations. She states that she's been seeing people in her room. At this time she does not see anyone and she is not confused. We talked about altering her medications at that she would not have hallucinations which she was agreeable to. Her children were not at the bedside.    Physical exam:  Gen. no acute distress awake alert oriented cranial nerves 2-12 appropriate Pupils equal round reactive to light, extra ocular muscles intact mucous 100 twice Chest decreased but clear to auscultation rhonchorous wheezes Cardiovascular regular rate and rhythm positive S1 and S2 chest is a 2 and 6 systolic ejection murmur Abdomen is soft and minimally distended minimally tender no guarding Extremities warm no edema  Neurologically she's awake alert oriented cranial nerves II through XII are intact  Lab Results  Component Value Date   WBC 13.4* 12/18/2012   HGB 8.3* 12/18/2012   HCT 25.3* 12/18/2012   MCV 95.1 12/18/2012   PLT 275 12/18/2012   Lab Results  Component Value Date   CREATININE 0.90 12/18/2012   BUN 18 12/18/2012   NA 138 12/18/2012   K 3.9 12/18/2012   CL 107 12/18/2012   CO2 24 12/18/2012    Assessment and plan: 79 year old white female status post ruptured appendix. Patient tolerated surgery and is advancing her diet. She still has some pain. She reports that she is having hallucinations related to opiate use.   1. DO NOT RESUSCITATE. She states her looked at the most form I'm too tired to go over that now  2. Pain: Patient reports visual hallucinations with oxycodone which are frightening to her. She had been  tolerating IV morphine despite a codeine allergy. I would therefore change of Roxanol 5 mg every 4 hours when necessary for pain.  Disposition the patient reports that her children are going to get some nursing homes for her. Total time 15 minutes   Tianni Escamilla L. Lovena Le, MD MBA The Palliative Medicine Team at [facility] Phone: [phone] Pager: [phone]

## 2012-12-20 NOTE — Progress Notes (Signed)
Message from Pt's daughter, Jackelyn Poling, stating that she is in Pt's room and would like to meet with CSW.  Attempted to meet with Pt's daughter, Jackelyn Poling, in Pt's room.  Per Pt, Debbie just left and it's likely that she's in her vehicle headed back to [facility].  LM for Pt's daughter on her cell phone.  Bernita Raisin, [place] [employer] [phone]

## 2012-12-20 NOTE — Progress Notes (Signed)
Notified by Alyse Low NT that patient had rash. Went to room and check on patient. Patient did indeed have a rash that she complained was pruritic. Patient denied breathing difficulties.  Had recently administered morphine orally. Paged Dr. Lovena Le. Received order for benedryl IV and to monitor patient for any breathing difficulties. Remer Macho RN

## 2012-12-20 NOTE — Progress Notes (Signed)
[facility] PROGRESS NOTE  Norma Smith [id number] DOB: [birth year] DOA: 12/13/2012  PCP: Cathlean Cower, MD  Brief Narrative: Pt is a 79 year old female with history of hypertension, moderate AS with HOCM, ( EF of 65-70% as per echo in June 2014, follows with Dr. Stanford Breed), diabetes mellitus on oral hypoglycemics, hypertension, iron deficiency anemia, CLL (currently stable and follows with Dr Juliann Mule) presented to the hospital with diffuse abdominal pain. She was found to have perforated appendicitis and was taken to the OR. Medicine was consulted for assistance with medical problems.   Assessment/Plan:  Perforated acute appendicitis with peritonitis  Status post laparoscopic appendectomy. Management per surgery.   Acute kidney injury  -Secondary to ARB, relative hypotension, SIRS -Resolved with hydration-follow and recheck cr in the a.m.  Aortic stenosis and HOCM  Last echo 5 months back with EF of 65-70% with HOCM. Being followed by Dr Stanford Breed as OP. Patient is on metoprolol and amlodipine at home. Continue PRn metoprolol IV.  Resume oral medications as soon as able. Avoid afterload reducing agents like hydralazine. Aspirin on hold. -Patient remains asymptomatic -She is tolerating full is present this time, will DC IV fluids continue to monitor fluid status Diabetes mellitus  Patient on different oral hypoglycemics. Last A1c from May of 4.7. Good Blood glucose control.  Patient remains on clear liquids, continue sliding scale insulin and follow - Hypertension  Blood pressure is currently stable. Continue when necessary IV Lopressor.  -Follow and resume by mouth meds at discharge as per renal recommendations or as clinically appropriate Iron deficiency anemia  Hemoglobin currently stable. Iron sulfate on hold.   CLL  Currently stable and not on any treatment. Followed in oncology as outpatient.   Hyperlipidemia  Lipitor on hold.   Code Status: Full code.  DVT Prophylaxis:  SCD's    Family Communication: Discussed with patient.  Disposition Plan: Per surgery. Will need telemetry if transferred.   Primary Service: General Surgery  Consultants: Nephrology  Procedures: Appendectomy  Antibiotics: Ertapenem 11/22  Subjective: Tolerating solids, denies any complaints.  Objective: Vital Signs  Filed Vitals:   12/19/12 0511 12/19/12 1407 12/19/12 1935 12/20/12 0455  BP: 144/48 124/47 155/52 137/76  Pulse: 85 82 82 90  Temp: 98.1 F (36.7 C) 98.6 F (37 C) 99.1 F (37.3 C) 98.4 F (36.9 C)  TempSrc: Oral Oral Oral Oral  Resp: 18 16 18 18   Height:      Weight:      SpO2: 96% 97% 98% 93%    Filed Weights   12/14/12 0200 12/15/12 0600 12/17/12 1552  Weight: 71 kg (156 lb 8.4 oz) 72.9 kg (160 lb 11.5 oz) 72.9 kg (160 lb 11.5 oz)    Intake/Output from previous day: 11/28 0701 - 11/29 0700 In: 76 [P.O.:680; IV Piggyback:50] Out: 545 [Urine:450; Drains:95]  General appearance: alert, cooperative, appears stated age and no distress Resp: Diminished air entry at bases.No crackles. Cardio: regular rate and rhythm, S1, S2 normal. Loud systolic murmur over precordium. No click, rub or gallop Abd: Covered in dressing with a drain.+ BS .  Extremities: extremities normal, atraumatic, no cyanosis or edema Neurologic: Alert and oriented x 3. No focal deficits  Smith Results:  Basic Metabolic Panel:  Recent Labs Smith 12/14/12 0345 12/15/12 0341 12/16/12 0340 12/17/12 0832 12/18/12 0420  NA 133* 135 134* 138 138  K 3.8 4.4 4.1 4.0 3.9  CL 98 105 105 107 107  CO2 25 22 21 22 24   GLUCOSE 141* 151* 139*  101* 112*  BUN 42* 43* 29* 19 18  CREATININE 1.37* 1.50* 1.12* 0.92 0.90  CALCIUM 8.5 7.6* 7.7* 8.3* 8.3*   Liver Function Tests:  Recent Labs Smith 12/13/12 1835 12/15/12 0341  AST 22 21  ALT 10 9  ALKPHOS 45 47  BILITOT 0.7 0.2*  PROT 6.8 5.1*  ALBUMIN 3.3* 2.1*    Recent Labs Smith 12/13/12 1835  LIPASE 12   CBC:  Recent  Labs Smith 12/13/12 1835 12/14/12 0345 12/15/12 0341 12/16/12 0340 12/18/12 0420  WBC 20.4* 13.3* 13.0* 14.8* 13.4*  NEUTROABS 14.5*  --   --   --   --   HGB 10.5* 10.0* 8.4* 8.9* 8.3*  HCT 31.0* 29.4* 25.4* 27.0* 25.3*  MCV 95.7 93.6 95.5 96.4 95.1  PLT 216 175 174 213 275   CBG:  Recent Labs Smith 12/19/12 1151 12/19/12 1618 12/19/12 2119 12/20/12 0736 12/20/12 1150  GLUCAP 127* 132* 76 83 93    Recent Results (from the past 240 hour(s))  URINE CULTURE     Status: None   Collection Time    12/13/12  7:39 PM      Result Value Range Status   Specimen Description URINE, CLEAN CATCH   Final   Special Requests NONE   Final   Culture  Setup Time     Final   Value: 12/14/2012 03:28     Performed at [facility]     Final   Value: 60,000 COLONIES/ML     Performed at [facility]   Culture     Final   Value: Multiple bacterial morphotypes present, none predominant. Suggest appropriate recollection if clinically indicated.     Performed at [facility]   Report Status 12/16/2012 FINAL   Final  MRSA PCR SCREENING     Status: None   Collection Time    12/14/12  1:30 AM      Result Value Range Status   MRSA by PCR NEGATIVE  NEGATIVE Final   Comment:            The GeneXpert MRSA Assay (FDA     approved for NASAL specimens     only), is one component of a     comprehensive MRSA colonization     surveillance program. It is not     intended to diagnose MRSA     infection nor to guide or     monitor treatment for     MRSA infections.      Studies/Results: No results found.  Medications:  Scheduled: . antiseptic oral rinse  15 mL Mouth Rinse QID  . enoxaparin (LOVENOX) injection  40 mg Subcutaneous Q24H  . ertapenem (INVANZ) IV  1 g Intravenous Q24H  . insulin aspart  0-15 Units Subcutaneous TID AC & HS  . pantoprazole  40 mg Oral Daily   Continuous:   [id number], metoprolol, morphine injection, morphine CONCENTRATE,  ondansetron (ZOFRAN) IV, ondansetron, phenol  Principal Problem:   Acute fulminating appendicitis with perforation and peritonitis Active Problems:   Hypertension   CLL (chronic lymphocytic leukemia)   Aortic stenosis, moderate   Hypertrophic obstructive cardiomyopathy(425.11)   AKI (acute kidney injury)    LOS: 7 days   Sheila Oats  [facility] Pager [phone] 12/20/2012, 1:38 PM  If 8PM-8AM, please contact night-coverage at [email or web address], password [facility]

## 2012-12-21 LAB — BASIC METABOLIC PANEL
BUN: 14 mg/dL (ref 6–23)
CO2: 23 mEq/L (ref 19–32)
Calcium: 8 mg/dL — ABNORMAL LOW (ref 8.4–10.5)
Chloride: 109 mEq/L (ref 96–112)
Creatinine, Ser: 0.91 mg/dL (ref 0.50–1.10)
GFR calc Af Amer: 69 mL/min — ABNORMAL LOW (ref >90)
Glucose, Bld: 114 mg/dL — ABNORMAL HIGH (ref 70–99)

## 2012-12-21 LAB — CBC
HCT: 24.8 % — ABNORMAL LOW (ref 36.0–46.0)
MCH: 31.2 pg (ref 26.0–34.0)
MCV: 94.3 fL (ref 78.0–100.0)
RBC: 2.63 MIL/uL — ABNORMAL LOW (ref 3.87–5.11)
RDW: 14.3 % (ref 11.5–15.5)
WBC: 15.1 10*3/uL — ABNORMAL HIGH (ref 4.0–10.5)

## 2012-12-21 LAB — GLUCOSE, CAPILLARY
Glucose-Capillary: 135 mg/dL — ABNORMAL HIGH (ref 70–99)
Glucose-Capillary: 98 mg/dL (ref 70–99)

## 2012-12-21 MED ORDER — DOCUSATE SODIUM 50 MG PO CAPS
50.0000 mg | ORAL_CAPSULE | Freq: Two times a day (BID) | ORAL | Status: DC
  Administered 2012-12-21 – 2012-12-22 (×3): 50 mg via ORAL
  Filled 2012-12-21 (×4): qty 1

## 2012-12-21 MED ORDER — DIPHENHYDRAMINE HCL 12.5 MG/5ML PO ELIX
12.5000 mg | ORAL_SOLUTION | Freq: Four times a day (QID) | ORAL | Status: DC | PRN
  Administered 2012-12-21 – 2012-12-22 (×2): 12.5 mg via ORAL
  Filled 2012-12-21 (×2): qty 5

## 2012-12-21 NOTE — Progress Notes (Signed)
8 Days Post-Op  Subjective: Rash on back but otherwise no complaints.  Tolerating diet.    Objective: Vital signs in last 24 hours: Temp:  [98.2 F (36.8 C)-98.8 F (37.1 C)] 98.2 F (36.8 C) (11/30 0610) Pulse Rate:  [62-88] 79 (11/30 0610) Resp:  [16-20] 16 (11/30 0610) BP: (132-163)/(48-81) 163/81 mmHg (11/30 0610) SpO2:  [95 %-96 %] 95 % (11/30 0610) Last BM Date: 12/20/12  Intake/Output from previous day: 11/29 0701 - 11/30 0700 In: 710 [P.O.:660; IV Piggyback:50] Out: 60 [Drains:60] Intake/Output this shift: Total I/O In: 120 [P.O.:120] Out: -   General appearance: alert, cooperative and no distress Resp: nonlabored Cardio: normal rate, regular GI: soft, NT, ?mild distension, wounds okay Rash noted on back.  This is only on the back in the area of contact with the bed Lab Results:   Recent Labs  12/21/12 0415  WBC 15.1*  HGB 8.2*  HCT 24.8*  PLT 359   BMET  Recent Labs  12/21/12 0415  NA 141  K 3.7  CL 109  CO2 23  GLUCOSE 114*  BUN 14  CREATININE 0.91  CALCIUM 8.0*   PT/INR No results found for this basename: LABPROT, INR,  in the last 72 hours ABG No results found for this basename: PHART, PCO2, PO2, HCO3,  in the last 72 hours  Studies/Results: No results found.  Anti-infectives: Anti-infectives   Start     Dose/Rate Route Frequency Ordered Stop   12/14/12 2100  ertapenem [facility]) 1 g in sodium chloride 0.9 % 50 mL IVPB     1 g 100 mL/hr over 30 Minutes Intravenous Every 24 hours 12/14/12 0148     12/13/12 2130  [MAR Hold]  ertapenem (INVANZ) 1 g in sodium chloride 0.9 % 50 mL IVPB     (On MAR Hold since 12/13/12 2152)  Comments:  stat   1 g 100 mL/hr over 30 Minutes Intravenous  Once 12/13/12 2119 12/13/12 2208      Assessment/Plan: s/p Procedure(s): APPENDECTOMY LAPAROSCOPIC (N/A) diet and activity as tolerated.  awaiting SNF placement.  rash could be contact related as well since it is only on her back where she has contact  with sheets.  Mobilize with assistance but should be ready for transfer to SNF tomorrow if available  LOS: 8 days    Madilyn Hook DAVID 12/21/2012

## 2012-12-21 NOTE — Progress Notes (Signed)
[facility] PROGRESS NOTE  Rhegan Smith [id number] DOB: 06/25/1933 DOA: 12/13/2012  PCP: Cathlean Cower, MD  Brief Narrative: Pt is a 79 year old female with history of hypertension, moderate AS with HOCM, ( EF of 65-70% as per echo in June 2014, follows with Dr. Stanford Breed), diabetes mellitus on oral hypoglycemics, hypertension, iron deficiency anemia, CLL (currently stable and follows with Dr Juliann Mule) presented to the hospital with diffuse abdominal pain. She was found to have perforated appendicitis and was taken to the OR. Medicine was consulted for assistance with medical problems.   Assessment/Plan:  Perforated acute appendicitis with peritonitis  Status post laparoscopic appendectomy. Management per surgery.   Acute kidney injury  -Secondary to ARB, relative hypotension, SIRS -Resolved with hydration-follow and recheck cr in the a.m.  Aortic stenosis and HOCM  Last echo 5 months back with EF of 65-70% with HOCM. Being followed by Dr Stanford Breed as OP. Patient is on metoprolol and amlodipine at home. Continue PRn metoprolol IV.  Resume oral medications as soon as able. Avoid afterload reducing agents like hydralazine. Aspirin on hold. -Patient remains asymptomatic -remains off IVF, and tolerating  POs Diabetes mellitus  Patient on different oral hypoglycemics. Last A1c from May of 4.7. Good Blood glucose control.  Patient remains on clear liquids, continue sliding scale insulin  -Follow and if continues to tolerate solids will restart on Actos Hypertension  Blood pressure is currently stable. Continue when necessary IV Lopressor.  -Follow and resume by mouth meds at discharge as per renal recommendations or as clinically appropriate  -plan is to resume avapro metoprolol and avapro on d/c Iron deficiency anemia  Hemoglobin currently stable. Iron sulfate on hold.  Drug Rash -d/t oxycodone /roxanol>>dc'ed -continue symptomatic treatment  CLL  Currently stable and not on any  treatment. Followed in oncology as outpatient.   Hyperlipidemia  Lipitor on hold.   Code Status: Full code.  DVT Prophylaxis: SCD's    Family Communication: Discussed with patient.  Disposition Plan: Per surgery. Will need telemetry if transferred.   Primary Service: General Surgery  Consultants: Nephrology  Procedures: Appendectomy  Antibiotics: Ertapenem 11/22  Subjective: Reports rash s/p pain meds last pm with s/o pruritis, denies SOB and no n/v  Objective: Vital Signs  Filed Vitals:   12/20/12 0455 12/20/12 1452 12/20/12 2152 12/21/12 0610  BP: 137/76 149/53 132/48 163/81  Pulse: 90 88 62 79  Temp: 98.4 F (36.9 C) 98.7 F (37.1 C) 98.8 F (37.1 C) 98.2 F (36.8 C)  TempSrc: Oral Oral Oral Oral  Resp: 18 20 20 16   Height:      Weight:      SpO2: 93% 96% 96% 95%    Filed Weights   12/14/12 0200 12/15/12 0600 12/17/12 1552  Weight: 71 kg (156 lb 8.4 oz) 72.9 kg (160 lb 11.5 oz) 72.9 kg (160 lb 11.5 oz)    Intake/Output from previous day: 11/29 0701 - 11/30 0700 In: 35 [P.O.:660; IV Piggyback:50] Out: 60 [Drains:60]  General appearance: alert, cooperative, appears stated age and no distress Resp: Diminished air entry at bases.No crackles. Cardio: regular rate and rhythm, S1, S2 normal. Loud systolic murmur over precordium. No click, rub or gallop Abd: Covered in dressing with a drain.+ BS . Back: maculopapular erythematous rash on back noted  Extremities: extremities normal, atraumatic, no cyanosis or edema Neurologic: Alert and oriented x 3. No focal deficits  Lab Results:  Basic Metabolic Panel:  Recent Labs Lab 12/15/12 0341 12/16/12 0340 12/17/12 0832 12/18/12 0420 12/21/12  0415  NA 135 134* 138 138 141  K 4.4 4.1 4.0 3.9 3.7  CL 105 105 107 107 109  CO2 22 21 22 24 23   GLUCOSE 151* 139* 101* 112* 114*  BUN 43* 29* 19 18 14   CREATININE 1.50* 1.12* 0.92 0.90 0.91  CALCIUM 7.6* 7.7* 8.3* 8.3* 8.0*   Liver Function Tests:  Recent  Labs Lab 12/15/12 0341  AST 21  ALT 9  ALKPHOS 47  BILITOT 0.2*  PROT 5.1*  ALBUMIN 2.1*   No results found for this basename: LIPASE, AMYLASE,  in the last 168 hours CBC:  Recent Labs Lab 12/15/12 0341 12/16/12 0340 12/18/12 0420 12/21/12 0415  WBC 13.0* 14.8* 13.4* 15.1*  HGB 8.4* 8.9* 8.3* 8.2*  HCT 25.4* 27.0* 25.3* 24.8*  MCV 95.5 96.4 95.1 94.3  PLT 174 213 275 359   CBG:  Recent Labs Lab 12/20/12 0736 12/20/12 1150 12/20/12 1646 12/20/12 2139 12/21/12 0734  GLUCAP 83 93 96 158* 98    Recent Results (from the past 240 hour(s))  URINE CULTURE     Status: None   Collection Time    12/13/12  7:39 PM      Result Value Range Status   Specimen Description URINE, CLEAN CATCH   Final   Special Requests NONE   Final   Culture  Setup Time     Final   Value: 12/14/2012 03:28     Performed at [facility]     Final   Value: 60,000 COLONIES/ML     Performed at [facility]   Culture     Final   Value: Multiple bacterial morphotypes present, none predominant. Suggest appropriate recollection if clinically indicated.     Performed at [facility]   Report Status 12/16/2012 FINAL   Final  MRSA PCR SCREENING     Status: None   Collection Time    12/14/12  1:30 AM      Result Value Range Status   MRSA by PCR NEGATIVE  NEGATIVE Final   Comment:            The GeneXpert MRSA Assay (FDA     approved for NASAL specimens     only), is one component of a     comprehensive MRSA colonization     surveillance program. It is not     intended to diagnose MRSA     infection nor to guide or     monitor treatment for     MRSA infections.      Studies/Results: No results found.  Medications:  Scheduled: . antiseptic oral rinse  15 mL Mouth Rinse QID  . docusate sodium  50 mg Oral BID  . enoxaparin (LOVENOX) injection  40 mg Subcutaneous Q24H  . ertapenem (INVANZ) IV  1 g Intravenous Q24H  . insulin aspart  0-15 Units  Subcutaneous TID AC & HS  . pantoprazole  40 mg Oral Daily   Continuous:   [id number], diphenhydrAMINE, metoprolol, ondansetron (ZOFRAN) IV, ondansetron, phenol  Principal Problem:   Acute fulminating appendicitis with perforation and peritonitis Active Problems:   Hypertension   CLL (chronic lymphocytic leukemia)   Aortic stenosis, moderate   Hypertrophic obstructive cardiomyopathy(425.11)   AKI (acute kidney injury)    LOS: 8 days   [place] C  [facility] Pager [phone] 12/21/2012, 10:44 AM  If 8PM-8AM, please contact night-coverage at [email or web address], password [facility]

## 2012-12-22 DIAGNOSIS — I359 Nonrheumatic aortic valve disorder, unspecified: Secondary | ICD-10-CM | POA: Diagnosis not present

## 2012-12-22 DIAGNOSIS — C801 Malignant (primary) neoplasm, unspecified: Secondary | ICD-10-CM | POA: Diagnosis not present

## 2012-12-22 DIAGNOSIS — M6281 Muscle weakness (generalized): Secondary | ICD-10-CM | POA: Diagnosis not present

## 2012-12-22 DIAGNOSIS — I080 Rheumatic disorders of both mitral and aortic valves: Secondary | ICD-10-CM | POA: Diagnosis not present

## 2012-12-22 DIAGNOSIS — I421 Obstructive hypertrophic cardiomyopathy: Secondary | ICD-10-CM | POA: Diagnosis not present

## 2012-12-22 DIAGNOSIS — R262 Difficulty in walking, not elsewhere classified: Secondary | ICD-10-CM | POA: Diagnosis not present

## 2012-12-22 DIAGNOSIS — K37 Unspecified appendicitis: Secondary | ICD-10-CM | POA: Diagnosis not present

## 2012-12-22 DIAGNOSIS — I10 Essential (primary) hypertension: Secondary | ICD-10-CM | POA: Diagnosis not present

## 2012-12-22 DIAGNOSIS — R279 Unspecified lack of coordination: Secondary | ICD-10-CM | POA: Diagnosis not present

## 2012-12-22 DIAGNOSIS — D649 Anemia, unspecified: Secondary | ICD-10-CM | POA: Diagnosis not present

## 2012-12-22 DIAGNOSIS — K352 Acute appendicitis with generalized peritonitis, without abscess: Secondary | ICD-10-CM | POA: Diagnosis not present

## 2012-12-22 DIAGNOSIS — F411 Generalized anxiety disorder: Secondary | ICD-10-CM | POA: Diagnosis not present

## 2012-12-22 DIAGNOSIS — Z4889 Encounter for other specified surgical aftercare: Secondary | ICD-10-CM | POA: Diagnosis not present

## 2012-12-22 DIAGNOSIS — E119 Type 2 diabetes mellitus without complications: Secondary | ICD-10-CM | POA: Diagnosis not present

## 2012-12-22 DIAGNOSIS — E785 Hyperlipidemia, unspecified: Secondary | ICD-10-CM | POA: Diagnosis not present

## 2012-12-22 LAB — CBC
HCT: 27 % — ABNORMAL LOW (ref 36.0–46.0)
MCH: 31.4 pg (ref 26.0–34.0)
MCHC: 33.3 g/dL (ref 30.0–36.0)
MCV: 94.1 fL (ref 78.0–100.0)
RDW: 14.1 % (ref 11.5–15.5)
WBC: 16.6 10*3/uL — ABNORMAL HIGH (ref 4.0–10.5)

## 2012-12-22 MED ORDER — AMOXICILLIN-POT CLAVULANATE 875-125 MG PO TABS: 1.0000 | ORAL_TABLET | Freq: Two times a day (BID) | ORAL | Status: DC

## 2012-12-22 NOTE — Progress Notes (Signed)
Patient is set to discharge to [facility] today. Patient & daughter, [name] aware. Discharge packet in [place]. [facility] called for transport.   Clinical Social Work Department CLINICAL SOCIAL WORK PLACEMENT NOTE 12/22/2012  Patient:  Norma Smith, Norma Smith  Account Number:  [id number] Admit date:  12/13/2012  Clinical Social Worker:  Renold Genta  Date/time:  12/19/2012 04:16 PM  Clinical Social Work is seeking post-discharge placement for this patient at the following level of care:   SKILLED NURSING   (*CSW will update this form in Epic as items are completed)   12/19/2012  Patient/family provided with [place] Department of Clinical Social Work's list of facilities offering this level of care within the geographic area requested by the patient (or if unable, by the patient's family).  12/19/2012  Patient/family informed of their freedom to choose among providers that offer the needed level of care, that participate in Medicare, Medicaid or managed care program needed by the patient, have an available bed and are willing to accept the patient.  12/19/2012  Patient/family informed of [facility]' ownership interest in [facility], as well as of the fact that they are under no obligation to receive care at this facility.  PASARR submitted to EDS on 12/19/2012 PASARR number received from EDS on 12/19/2012  FL2 transmitted to all facilities in geographic area requested by pt/family on  12/19/2012 FL2 transmitted to all facilities within larger geographic area on   Patient informed that his/her managed care company has contracts with or will negotiate with  certain facilities, including the following:     Patient/family informed of bed offers received:  12/19/2012 Patient chooses bed at [facility]ician recommends and patient chooses bed at    Patient to be transferred to [facility] on  12/22/2012 Patient to be transferred to facility by [facility]  The following  physician request were entered in Epic:   Additional Comments:    Winfred Leeds, [place] Worker cell #: [phone]

## 2012-12-22 NOTE — Progress Notes (Signed)
Physical Therapy Treatment Patient Details Name: Norma Smith MRN: [id number] DOB: [birth year] Today's Date: 12/22/2012 Time: [id number] PT Time Calculation (min): 11 min  PT Assessment / Plan / Recommendation  History of Present Illness Chief Complaint: Severe, persistent lower abdominal pain. perforated viscous/peritonitis. s/p laparoscopic appendectomy on 12/13/12   PT Comments   **Progressing well with mobility, walked without assistive device today. REady to DC to SNF from PT standpoint.*  Follow Up Recommendations  SNF     Does the patient have the potential to tolerate intense rehabilitation     Barriers to Discharge        Equipment Recommendations  None recommended by PT    Recommendations for Other Services OT consult  Frequency Min 3X/week   Progress towards PT Goals Progress towards PT goals: Progressing toward goals  Plan Current plan remains appropriate    Precautions / Restrictions Precautions Precautions: Fall Restrictions Weight Bearing Restrictions: No   Pertinent Vitals/Pain *0/10 pain**    Mobility  Bed Mobility Bed Mobility: Supine to Sit Sit to Supine: 6: Modified independent (Device/Increase time);With rail;HOB elevated Transfers Transfers: Sit to Stand;Stand to Sit Sit to Stand: From bed;5: Supervision;With upper extremity assist Stand to Sit: 5: Supervision;To chair/3-in-1;With upper extremity assist Details for Transfer Assistance: VCs  hand placement Ambulation/Gait Ambulation/Gait Assistance: 4: Min guard Ambulation Distance (Feet): 200 Feet Assistive device: None Gait Pattern: Decreased stride length;Within Functional Limits Gait velocity: WFL General Gait Details: at baseline pt walks without AD, in hospital has been using RW but she feels she no longer needs it, trial without RW, occaissional mild LOB but able to self correct, occaissional reaching for handrail in hall    Exercises     PT Diagnosis:    PT Problem List:   PT  Treatment Interventions:     PT Goals (current goals can now be found in the care plan section) Acute Rehab PT Goals Patient Stated Goal: to sleep in my own bed! And walk without walker PT Goal Formulation: With patient Time For Goal Achievement: 12/29/12 Potential to Achieve Goals: Good  Visit Information  Last PT Received On: 12/22/12 Assistance Needed: +1 History of Present Illness: Chief Complaint: Severe, persistent lower abdominal pain. perforated viscous/peritonitis. s/p laparoscopic appendectomy on 12/13/12    Subjective Data  Patient Stated Goal: to sleep in my own bed! And walk without walker   Cognition  Cognition Arousal/Alertness: Awake/alert Behavior During Therapy: WFL for tasks assessed/performed Overall Cognitive Status: Within Functional Limits for tasks assessed    Balance     End of Session PT - End of Session Equipment Utilized During Treatment: Gait belt Activity Tolerance: Patient tolerated treatment well Patient left: with call bell/phone within reach;in chair;with nursing/sitter in room Nurse Communication: Mobility status   GP     Norma Smith 12/22/2012, 10:47 AM [phone]

## 2012-12-22 NOTE — Plan of Care (Signed)
Problem: Discharge Progression Outcomes Goal: Hemodynamically stable Outcome: Adequate for Discharge F/U at SNF Goal: Activity appropriate for discharge plan Outcome: Adequate for Discharge F/U at [facility]

## 2012-12-22 NOTE — Discharge Summary (Signed)
Physician Discharge Summary  Patient ID: Norma Smith MRN: [id number] DOB/AGE: Aug 30, 1933 79 y.o.  Admit date: 12/13/2012 Discharge date: 12/22/2012  Admission Diagnoses:  Free perforation  Discharge Diagnoses:  Perforated appendicitis with free air  Principal Problem:   Acute fulminating appendicitis with perforation and peritonitis Active Problems:   Hypertension   CLL (chronic lymphocytic leukemia)   Aortic stenosis, moderate   Hypertrophic obstructive cardiomyopathy(425.11)   AKI (acute kidney injury)   Surgery:  Laparoscopic appendectomy per Dr. Zella Richer  Discharged Condition: improved.    Hospital Course:   Had lap appendectomy with drain placement per Dr. Zella Richer.  JP in place.  Followed by Dr. Lovena Le in palliative care.  Slow improvement and kept on Invanz postop.  Ready for SNF placement.  Will keep on Augmentin postop  Consults: palliative care  Significant Diagnostic Studies: CT scan    Discharge Exam: Blood pressure 151/56, pulse 76, temperature 97.6 F (36.4 C), temperature source Oral, resp. rate 18, height 5' (1.524 m), weight 160 lb 11.5 oz (72.9 kg), SpO2 100.00%. Eating.  Abdomen not hurting.  JP in place  Disposition: Final discharge disposition not confirmed  Discharge Orders   Future Appointments Provider Department Dept Phone   11/30/2013 1:00 PM [facility] [id number]   11/30/2013 1:30 PM [facility] Covering Provider [place] [phone]   Future Orders Complete By Expires   Diet - low sodium heart healthy  As directed    Discharge wound care:  As directed    Comments:     Recharge and drain JP bulb every 12 hours or as needed.   Increase activity slowly  As directed        Medication List         acetaminophen 325 MG tablet  Commonly known as:  TYLENOL  Take 650 mg by mouth as needed for mild pain.     amLODipine 5 MG tablet  Commonly known as:   NORVASC  Take 5 mg by mouth daily.     amoxicillin-clavulanate 875-125 MG per tablet  Commonly known as:  AUGMENTIN  Take 1 tablet by mouth 2 (two) times daily.     aspirin 81 MG tablet  Take 81 mg by mouth daily.     atorvastatin 20 MG tablet  Commonly known as:  LIPITOR  Take 20 mg by mouth daily.     Calcium-D 600-400 MG-UNIT Tabs  Take by mouth 2 (two) times daily.     CENTRUM SILVER PO  Take by mouth daily.     diazepam 5 MG tablet  Commonly known as:  VALIUM  Take 2.5 mg by mouth daily.     hydrochlorothiazide 25 MG tablet  Commonly known as:  HYDRODIURIL  Take 1 tablet (25 mg total) by mouth daily.     irbesartan 300 MG tablet  Commonly known as:  AVAPRO  Take 1 tablet (300 mg total) by mouth at bedtime.     Iron 325 (65 FE) MG Tabs  Take by mouth 2 (two) times daily.     metFORMIN 1000 MG tablet  Commonly known as:  GLUCOPHAGE  Take 1 tablet (1,000 mg total) by mouth daily.     metoprolol tartrate 25 MG tablet  Commonly known as:  LOPRESSOR  Take 12.5 mg by mouth every morning.     pioglitazone 30 MG tablet  Commonly known as:  ACTOS  Take 1 tablet (30 mg total) by mouth daily.  Follow-up Information   Follow up with [facility]. Schedule an appointment as soon as possible for a visit in 10 days. (Make appt with [facility] in10 days)    Contact information:   [place] [id number] [phone]      Signed: Pedro Earls 12/22/2012, 11:52 AM

## 2012-12-22 NOTE — Progress Notes (Signed)
[facility] PROGRESS NOTE  Norma Smith [id number] DOB: 1934/01/16 DOA: 12/13/2012  PCP: Cathlean Cower, MD  Brief Narrative: Pt is a 79 year old female with history of hypertension, moderate AS with HOCM, ( EF of 65-70% as per echo in June 2014, follows with Dr. Stanford Breed), diabetes mellitus on oral hypoglycemics, hypertension, iron deficiency anemia, CLL (currently stable and follows with Dr Juliann Mule) presented to the hospital with diffuse abdominal pain. She was found to have perforated appendicitis and was taken to the OR. Medicine was consulted for assistance with medical problems.   Assessment/Plan:  Perforated acute appendicitis with peritonitis  Status post laparoscopic appendectomy. Management per surgery.   Acute kidney injury  -Secondary to ARB, relative hypotension, SIRS -Resolved with hydration-follow and recheck cr in the a.m.  Aortic stenosis and HOCM  Last echo 5 months back with EF of 65-70% with HOCM. Being followed by Dr Stanford Breed as OP. Patient is on metoprolol and amlodipine at home. Continue PRn metoprolol IV.  Resume oral medications as soon as able. Avoid afterload reducing agents like hydralazine. Aspirin on hold. -Patient remains asymptomatic -remains off IVF, and tolerating  POs Diabetes mellitus  Patient on different oral hypoglycemics. Last A1c from May of 4.7. Good Blood glucose control.  Patient remains on clear liquids, continue sliding scale insulin  -Tolerating diet well, to restart Actos upon discharge. -Nursing home M.D. to continue to monitor her blood sugars and consider restarting metformin if clinically appropriate Hypertension  Blood pressure is currently stable. Continue when necessary IV Lopressor.  -Follow and resume by mouth meds at discharge as per renal recommendations or as clinically appropriate  -to resume avapro metoprolol and avapro upon d/c as indicated on med rec Iron deficiency anemia  Hemoglobin currently stable. Resume  Iron  sulfate upon d/c Drug Rash -d/t oxycodone /roxanol>>dc'ed -continue symptomatic treatment  CLL  Currently stable and not on any treatment. Followed in oncology as outpatient.   Hyperlipidemia  Lipitor on hold.   Code Status: Full code.  DVT Prophylaxis: SCD's    Family Communication: Discussed with patient.  Disposition Plan:per primary, pt medically ready from medicine standpoint for d/c  Primary Service: General Surgery  Consultants: Nephrology  Procedures: Appendectomy  Antibiotics: Ertapenem 11/22  Subjective: Denies any c/o today. Objective: Vital Signs  Filed Vitals:   12/21/12 2150 12/21/12 2355 12/22/12 0516 12/22/12 1044  BP: 172/54 131/56 150/48 151/56  Pulse: 86 77 79 76  Temp: 98.8 F (37.1 C)  98.4 F (36.9 C) 97.6 F (36.4 C)  TempSrc: Oral  Oral Oral  Resp: 18  18 18   Height:      Weight:      SpO2: 100%  96% 100%    Filed Weights   12/14/12 0200 12/15/12 0600 12/17/12 1552  Weight: 71 kg (156 lb 8.4 oz) 72.9 kg (160 lb 11.5 oz) 72.9 kg (160 lb 11.5 oz)    Intake/Output from previous day: 11/30 0701 - 12/01 0700 In: 1130 [P.O.:1080; IV Piggyback:50] Out: -   General appearance: alert, cooperative, appears stated age and no distress Resp: Diminished air entry at bases.No crackles. Cardio: regular rate and rhythm, S1, S2 normal. Loud systolic murmur over precordium. No click, rub or gallop Abd: Covered in dressing with a drain.+ BS . Back: decreased maculopapular erythematous rash on back noted  Extremities: extremities normal, atraumatic, no cyanosis or edema Neurologic: Alert and oriented x 3. No focal deficits  Lab Results:  Basic Metabolic Panel:  Recent Labs Lab 12/16/12 0340 12/17/12 [id number]  12/18/12 0420 12/21/12 0415  NA 134* 138 138 141  K 4.1 4.0 3.9 3.7  CL 105 107 107 109  CO2 21 22 24 23   GLUCOSE 139* 101* 112* 114*  BUN 29* 19 18 14   CREATININE 1.12* 0.92 0.90 0.91  CALCIUM 7.7* 8.3* 8.3* 8.0*   Liver Function  Tests: No results found for this basename: AST, ALT, ALKPHOS, BILITOT, PROT, ALBUMIN,  in the last 168 hours No results found for this basename: LIPASE, AMYLASE,  in the last 168 hours CBC:  Recent Labs Lab 12/16/12 0340 12/18/12 0420 12/21/12 0415  WBC 14.8* 13.4* 15.1*  HGB 8.9* 8.3* 8.2*  HCT 27.0* 25.3* 24.8*  MCV 96.4 95.1 94.3  PLT 213 275 359   CBG:  Recent Labs Lab 12/21/12 0734 12/21/12 1159 12/21/12 1706 12/21/12 2149 12/22/12 0746  GLUCAP 98 135* 98 118* 79    Recent Results (from the past 240 hour(s))  URINE CULTURE     Status: None   Collection Time    12/13/12  7:39 PM      Result Value Range Status   Specimen Description URINE, CLEAN CATCH   Final   Special Requests NONE   Final   Culture  Setup Time     Final   Value: 12/14/2012 03:28     Performed at [facility]     Final   Value: 60,000 COLONIES/ML     Performed at [facility]   Culture     Final   Value: Multiple bacterial morphotypes present, none predominant. Suggest appropriate recollection if clinically indicated.     Performed at [facility]   Report Status 12/16/2012 FINAL   Final  MRSA PCR SCREENING     Status: None   Collection Time    12/14/12  1:30 AM      Result Value Range Status   MRSA by PCR NEGATIVE  NEGATIVE Final   Comment:            The GeneXpert MRSA Assay (FDA     approved for NASAL specimens     only), is one component of a     comprehensive MRSA colonization     surveillance program. It is not     intended to diagnose MRSA     infection nor to guide or     monitor treatment for     MRSA infections.      Studies/Results: No results found.  Medications:  Scheduled: . antiseptic oral rinse  15 mL Mouth Rinse QID  . docusate sodium  50 mg Oral BID  . enoxaparin (LOVENOX) injection  40 mg Subcutaneous Q24H  . ertapenem (INVANZ) IV  1 g Intravenous Q24H  . insulin aspart  0-15 Units Subcutaneous TID AC & HS  .  pantoprazole  40 mg Oral Daily   Continuous:   [id number], diphenhydrAMINE, metoprolol, ondansetron (ZOFRAN) IV, ondansetron, phenol  Principal Problem:   Acute fulminating appendicitis with perforation and peritonitis Active Problems:   Hypertension   CLL (chronic lymphocytic leukemia)   Aortic stenosis, moderate   Hypertrophic obstructive cardiomyopathy(425.11)   AKI (acute kidney injury)    LOS: 9 days   [place] C  [facility] Pager [phone] 12/22/2012, 10:53 AM  If 8PM-8AM, please contact night-coverage at [email or web address], password [facility]

## 2012-12-22 NOTE — Progress Notes (Signed)
9 Days Post-Op  Subjective: She says she feels pretty good, needs help walking.  Objective: Vital signs in last 24 hours: Temp:  [97.6 F (36.4 C)-98.8 F (37.1 C)] 97.6 F (36.4 C) (12/01 1044) Pulse Rate:  [76-86] 76 (12/01 1044) Resp:  [18] 18 (12/01 1044) BP: (127-172)/(45-56) 151/56 mmHg (12/01 1044) SpO2:  [96 %-100 %] 100 % (12/01 1044) Last BM Date: 12/22/12 1080 PO recorded Afebrile, VSS WBC was still up yesterday  Creatinine is better. Diet:  Carb modified. Intake/Output from previous day: 11/30 0701 - 12/01 0700 In: 1130 [P.O.:1080; IV Piggyback:50] Out: -  Intake/Output this shift: Total I/O In: 360 [P.O.:360] Out: -   General appearance: alert, cooperative and no distress GI: soft, non-tender; bowel sounds normal; no masses,  no organomegaly and incisions and drain site look good the drain has been removed.    Lab Results:   Recent Labs  12/21/12 0415  WBC 15.1*  HGB 8.2*  HCT 24.8*  PLT 359    BMET  Recent Labs  12/21/12 0415  NA 141  K 3.7  CL 109  CO2 23  GLUCOSE 114*  BUN 14  CREATININE 0.91  CALCIUM 8.0*   PT/INR No results found for this basename: LABPROT, INR,  in the last 72 hours  No results found for this basename: AST, ALT, ALKPHOS, BILITOT, PROT, ALBUMIN,  in the last 168 hours   Lipase     Component Value Date/Time   LIPASE 12 12/13/2012 1835     Studies/Results: No results found.  Medications: . antiseptic oral rinse  15 mL Mouth Rinse QID  . docusate sodium  50 mg Oral BID  . enoxaparin (LOVENOX) injection  40 mg Subcutaneous Q24H  . ertapenem (INVANZ) IV  1 g Intravenous Q24H  . insulin aspart  0-15 Units Subcutaneous TID AC & HS  . pantoprazole  40 mg Oral Daily    Assessment/Plan Acute perforated appendicitis with peritonitis  S/p Laparoscopic appendectomy, 12/13/2012, Odis Hollingshead, MD.  Hypotension/SIRS 9 days of Invanz Acute renal insuffiencey  Aortic Stenosis/HOCM  AODM  HYPERTENSION   CLL Anemia DNR   Plan:  I will check her WBC, and ask about antibiotics and duration.  Once that is evaluated we can decide on SNF.  She gets Invanz at 10 PM.     LOS: 9 days    Norma Smith,Norma Smith 12/22/2012

## 2012-12-22 NOTE — Progress Notes (Signed)
Patient discharged [facility] via [facility], patient discharge packet prepared by CSW and given to EMS transporter for facility. Patient alert and oriented, denies any pain/distress. No wound, surgical incision with no sign of infection. Report given to nurse at [facility] place.

## 2012-12-22 NOTE — Progress Notes (Signed)
Nutrition Brief Note  Patient identified on the Malnutrition Screening Tool (MST) Report  Wt Readings from Last 15 Encounters:  12/17/12 160 lb 11.5 oz (72.9 kg)  12/17/12 160 lb 11.5 oz (72.9 kg)  12/01/12 152 lb 12.8 oz (69.31 kg)  06/11/12 151 lb 4 oz (68.607 kg)  12/14/11 155 lb (70.308 kg)  12/11/11 149 lb 12 oz (67.926 kg)  06/20/11 148 lb (67.132 kg)  06/08/11 145 lb (65.772 kg)  12/11/10 147 lb 14.4 oz (67.087 kg)  12/01/10 146 lb 8 oz (66.452 kg)    Body mass index is 31.39 kg/(m^2). Patient meets criteria for obesity based on current BMI.   Pt reports that her appetite was poor prior to admission, but that it has been improving. She reports her usual body weight as 152 lbs and was surprised that she had gained weight. She said that she does not want nutritional supplements at this time.  Current diet order is carb modified, patient is consuming approximately 80% of meals at this time. Labs and medications reviewed.   No nutrition interventions warranted at this time. If nutrition issues arise, please consult RD.   Terrace Arabia RD, LDN

## 2012-12-22 NOTE — Progress Notes (Signed)
rash on her back which had all but gone away last night reappeared this morning and continues to look like dermatitis.  It is itchy, area cleansed, benedryl given and instructed to not lay on back as much today, sit in recliner, on edge of bed, go for walks ( with assist) to decrease the time spent lying on back.  No narcotics given last night no new meds.  NAD.  Will continue to monitor

## 2012-12-23 ENCOUNTER — Non-Acute Institutional Stay (SKILLED_NURSING_FACILITY): Payer: Medicare Other | Admitting: Adult Health

## 2012-12-23 DIAGNOSIS — D649 Anemia, unspecified: Secondary | ICD-10-CM

## 2012-12-23 DIAGNOSIS — F419 Anxiety disorder, unspecified: Secondary | ICD-10-CM

## 2012-12-23 DIAGNOSIS — K3532 Acute appendicitis with perforation and localized peritonitis, without abscess: Secondary | ICD-10-CM

## 2012-12-23 DIAGNOSIS — F411 Generalized anxiety disorder: Secondary | ICD-10-CM

## 2012-12-23 DIAGNOSIS — I10 Essential (primary) hypertension: Secondary | ICD-10-CM

## 2012-12-23 DIAGNOSIS — K352 Acute appendicitis with generalized peritonitis, without abscess: Secondary | ICD-10-CM

## 2012-12-23 DIAGNOSIS — E785 Hyperlipidemia, unspecified: Secondary | ICD-10-CM

## 2012-12-24 ENCOUNTER — Non-Acute Institutional Stay (SKILLED_NURSING_FACILITY): Payer: Medicare Other | Admitting: Internal Medicine

## 2012-12-24 DIAGNOSIS — I359 Nonrheumatic aortic valve disorder, unspecified: Secondary | ICD-10-CM

## 2012-12-24 DIAGNOSIS — I421 Obstructive hypertrophic cardiomyopathy: Secondary | ICD-10-CM

## 2012-12-24 DIAGNOSIS — K35209 Acute appendicitis with generalized peritonitis, without abscess, unspecified as to perforation: Secondary | ICD-10-CM

## 2012-12-24 DIAGNOSIS — I350 Nonrheumatic aortic (valve) stenosis: Secondary | ICD-10-CM

## 2012-12-24 DIAGNOSIS — K352 Acute appendicitis with generalized peritonitis, without abscess: Secondary | ICD-10-CM | POA: Diagnosis not present

## 2012-12-24 DIAGNOSIS — I10 Essential (primary) hypertension: Secondary | ICD-10-CM

## 2012-12-24 DIAGNOSIS — K3532 Acute appendicitis with perforation and localized peritonitis, without abscess: Secondary | ICD-10-CM

## 2012-12-30 ENCOUNTER — Telehealth (INDEPENDENT_AMBULATORY_CARE_PROVIDER_SITE_OTHER): Payer: Self-pay | Admitting: General Surgery

## 2012-12-30 NOTE — Telephone Encounter (Signed)
Pt's daughter called to get clearance to drive.  She had lap appe on 12/13/12.  Daughter states mother/ pt is not taking narcotics any more and will wear her seatbelt.  Gave pt the okay to drive on Monday.  Daughter agrees.

## 2012-12-31 ENCOUNTER — Non-Acute Institutional Stay (SKILLED_NURSING_FACILITY): Payer: Medicare Other | Admitting: Adult Health

## 2012-12-31 DIAGNOSIS — F411 Generalized anxiety disorder: Secondary | ICD-10-CM

## 2012-12-31 DIAGNOSIS — E785 Hyperlipidemia, unspecified: Secondary | ICD-10-CM

## 2012-12-31 DIAGNOSIS — I10 Essential (primary) hypertension: Secondary | ICD-10-CM

## 2012-12-31 DIAGNOSIS — D649 Anemia, unspecified: Secondary | ICD-10-CM

## 2012-12-31 DIAGNOSIS — K352 Acute appendicitis with generalized peritonitis, without abscess: Secondary | ICD-10-CM

## 2012-12-31 DIAGNOSIS — F419 Anxiety disorder, unspecified: Secondary | ICD-10-CM

## 2012-12-31 DIAGNOSIS — K3532 Acute appendicitis with perforation and localized peritonitis, without abscess: Secondary | ICD-10-CM

## 2013-01-02 DIAGNOSIS — E119 Type 2 diabetes mellitus without complications: Secondary | ICD-10-CM | POA: Diagnosis not present

## 2013-01-02 DIAGNOSIS — I10 Essential (primary) hypertension: Secondary | ICD-10-CM | POA: Diagnosis not present

## 2013-01-02 DIAGNOSIS — C91Z0 Other lymphoid leukemia not having achieved remission: Secondary | ICD-10-CM | POA: Diagnosis not present

## 2013-01-03 DIAGNOSIS — E119 Type 2 diabetes mellitus without complications: Secondary | ICD-10-CM | POA: Diagnosis not present

## 2013-01-03 DIAGNOSIS — I10 Essential (primary) hypertension: Secondary | ICD-10-CM | POA: Diagnosis not present

## 2013-01-03 DIAGNOSIS — C91Z0 Other lymphoid leukemia not having achieved remission: Secondary | ICD-10-CM | POA: Diagnosis not present

## 2013-01-04 DIAGNOSIS — F419 Anxiety disorder, unspecified: Secondary | ICD-10-CM | POA: Insufficient documentation

## 2013-01-04 NOTE — Progress Notes (Signed)
Patient ID: Norma Smith, female   DOB: [birth year], 79 y.o.   MRN: [id number]                         PROGRESS NOTE  DATE: 12/23/2012  FACILITY: Nursing Home Location: [facility]  LEVEL OF CARE: SNF (31)  Acute Visit  CHIEF COMPLAINT: Follow-up Hospitalization  HISTORY OF PRESENT ILLNESS: This is a 79 year old female who has been admitted to [facility] on 12/22/12 from [facility] with Acute appendicitis  with peritonitis S/P  Laparoscopic Appendectomy. She has been admitted for a short-term rehabilitation.                                                                                                                                                                                                                                                                                                                                                                        REASSESSMENT OF ONGOING PROBLEM(S):  HTN: Pt 's HTN remains stable.  Denies CP, sob, DOE, pedal edema, headaches, dizziness or visual disturbances.  No complications from the medications currently being used.  Last BP : 130/86  ANEMIA: The anemia has been stable. The patient denies fatigue, melena or hematochezia. No complications from the medications currently being used. 12/14  hgb 9.0  HYPERLIPIDEMIA: No complications from the medications presently being used. 5/14 fasting lipid panel showed : cholesterol 145 triglycerides 67 HDL 59 LDL 72 PAST MEDICAL HISTORY : Reviewed.  No changes.  CURRENT MEDICATIONS: Reviewed per [facility]  REVIEW OF SYSTEMS:  GENERAL: no change in appetite, no fatigue, no weight changes, no fever, chills or weakness RESPIRATORY: no cough, SOB, DOE, wheezing, hemoptysis CARDIAC: no chest pain,  edema or palpitations GI: no abdominal pain, diarrhea, constipation, heart burn, nausea or vomiting  PHYSICAL EXAMINATION  VS:  T98.4       P99   RR18      BP130/86     POX 97%     WT  (Lb)  GENERAL: no acute distress, normal body habitus EYES: conjunctivae normal, sclerae normal, normal eye lids NECK: supple, trachea midline, no neck masses, no thyroid tenderness, no thyromegaly LYMPHATICS: no LAN in the neck, no supraclavicular LAN RESPIRATORY: breathing is even & unlabored, BS CTAB CARDIAC: RRR, no murmur,no extra heart sounds, no edema GI: abdomen soft, normal BS, no masses, no tenderness, no hepatomegaly, no splenomegaly PSYCHIATRIC: the patient is alert & oriented to person, affect & behavior appropriate  LABS/RADIOLOGY: 12/22/12 WBC 16.6 hemoglobin 9.0 hematocrit 37.0  12/21/12 sodium 141 potassium 2.7 glucose 114 BUN 14 creatinine 68.0 06/04/12 hemoglobin being A1c 5.7 cholesterol   ASSESSMENT/PLAN:  Perforated appendicitis with peritonitis status post laparoscopic appendectomy - for rehabilitation  Hypertension - well controlled  Anemia - continue ferrous sulfate  Hyperlipidemia - continue Lipitor  Anxiety - continue Valium  Diabetes mellitus, type II - continue Glucophage and Actos    CPT CODE: 29562

## 2013-01-05 DIAGNOSIS — E119 Type 2 diabetes mellitus without complications: Secondary | ICD-10-CM | POA: Diagnosis not present

## 2013-01-05 DIAGNOSIS — I10 Essential (primary) hypertension: Secondary | ICD-10-CM | POA: Diagnosis not present

## 2013-01-05 DIAGNOSIS — C91Z0 Other lymphoid leukemia not having achieved remission: Secondary | ICD-10-CM | POA: Diagnosis not present

## 2013-01-05 NOTE — Progress Notes (Signed)
Patient ID: Norma Smith, female   DOB: 21-Jul-1933, 79 y.o.   MRN: [id number]              PROGRESS NOTE  DATE: 12/31/2012   FACILITY: [facility]  LEVEL OF CARE: SNF (31)  Acute Visit  CHIEF COMPLAINT:  Discharge Notes  HISTORY OF PRESENT ILLNESS:  This is a 79 year old female who is for discharge home with Home health PT, OT, [place] and Social worker. DME: rolling walker with bench. She has been admitted to [facility] on 12/22/12 from [facility] with Acute appendicitis  with peritonitis S/P  Laparoscopic Appendectomy.   Patient was admitted to this facility for short-term rehabilitation after the patient's recent hospitalization.  Patient has completed SNF rehabilitation and therapy has cleared the patient for discharge.  Reassessment of ongoing problem(s):  HTN: Pt 's HTN remains stable.  Denies CP, sob, DOE, pedal edema, headaches, dizziness or visual disturbances.  No complications from the medications currently being used.  Last BP : 105/50  ANXIETY: The anxiety remains stable. Patient denies ongoing anxiety or irritability. No complications reported from the medications currently being used.  DM:pt's DM remains stable.  Pt denies polyuria, polydipsia, polyphagia, changes in vision or hypoglycemic episodes.  No complications noted from the medication presently being used.  5/14hemoglobin A1c 5.7:  PAST MEDICAL HISTORY : Reviewed.  No changes.  CURRENT MEDICATIONS: Reviewed per [facility]  REVIEW OF SYSTEMS:  GENERAL: no change in appetite, no fatigue, no weight changes, no fever, chills or weakness RESPIRATORY: no cough, SOB, DOE, wheezing, hemoptysis CARDIAC: no chest pain, edema or palpitations GI: no abdominal pain, diarrhea, constipation, heart burn, nausea or vomiting  PHYSICAL EXAMINATION  VS:  T97.7       P 99      RR18      BP105/50      POX95 %       WT152.2 (Lb)  GENERAL: no acute distress, normal body habitus NECK: supple, trachea midline,  no neck masses, no thyroid tenderness, no thyromegaly LYMPHATICS: no LAN in the neck, no supraclavicular LAN RESPIRATORY: breathing is even & unlabored, BS CTAB CARDIAC: RRR, no murmur,no extra heart sounds, no edema GI: abdomen soft, normal BS, no masses, no tenderness, no hepatomegaly, no splenomegaly PSYCHIATRIC: the patient is alert & oriented to person, affect & behavior appropriate  LABS/RADIOLOGY: 12/25/12 WBC 14.7 hemoglobin 8.7 hematocrit 29.1 sodium 141 potassium 3.8 glucose 157 BUN 15 creatinine 0.9 calcium 8.5 12/22/12 WBC 16.6 hemoglobin 9.0 hematocrit 37.0  12/21/12 sodium 141 potassium 2.7 glucose 114 BUN 14 creatinine 68.0 06/04/12 hemoglobin being A1c 5.7 cholesterol   ASSESSMENT/PLAN:  Perforated appendicitis with peritonitis status post laparoscopic appendectomy - for Home health PT, OT, Nurse and social worker  Hypertension - well controlled; continue Norvasc, Hydrodiuril and Avapro  Anemia - continue ferrous sulfate  Hyperlipidemia - continue Lipitor  Anxiety - continue Valium    I have filled out patient's discharge paperwork and written prescriptions.  Patient will receive home health PT, OT, nursing and Social worker  DME provided:rolling walker with bench  Total discharge time: Greater than 30 minutes Discharge time involved coordination of the discharge process with Education officer, museum, nursing staff and therapy department. Medical justification for home health services/DME verified.  CPT CODE: 09811

## 2013-01-06 ENCOUNTER — Encounter (INDEPENDENT_AMBULATORY_CARE_PROVIDER_SITE_OTHER): Payer: Self-pay | Admitting: General Surgery

## 2013-01-06 ENCOUNTER — Ambulatory Visit (INDEPENDENT_AMBULATORY_CARE_PROVIDER_SITE_OTHER): Payer: Medicare Other | Admitting: General Surgery

## 2013-01-06 VITALS — BP 110/52 | HR 72 | Temp 97.8°F | Resp 14 | Ht 61.5 in | Wt 142.6 lb

## 2013-01-06 DIAGNOSIS — Z4889 Encounter for other specified surgical aftercare: Secondary | ICD-10-CM

## 2013-01-06 NOTE — Patient Instructions (Signed)
Resume normal activities as tolerated. Do not overeat.

## 2013-01-06 NOTE — Progress Notes (Signed)
Procedure:  Laparoscopic appendectomy for acute perforated appendicitis  Date:  12/13/2012  History:  She is here for her first postoperative visit. She is home now. Her energy level is slowly returning. Food does not taste good to her at this time. Her bowels are moving.  Exam: General- Is in NAD. Abdomen-the small incisions are clean and intact. No evidence of hernia.  Assessment:  She is progressing slowly postoperatively.  Plan:  Activities as tolerated. I told her her taste would return eventually but it will take more time. Return visit as needed.

## 2013-01-07 DIAGNOSIS — E119 Type 2 diabetes mellitus without complications: Secondary | ICD-10-CM | POA: Diagnosis not present

## 2013-01-07 DIAGNOSIS — I10 Essential (primary) hypertension: Secondary | ICD-10-CM | POA: Diagnosis not present

## 2013-01-07 DIAGNOSIS — C91Z0 Other lymphoid leukemia not having achieved remission: Secondary | ICD-10-CM | POA: Diagnosis not present

## 2013-01-09 ENCOUNTER — Encounter (INDEPENDENT_AMBULATORY_CARE_PROVIDER_SITE_OTHER): Payer: Medicare Other | Admitting: Surgery

## 2013-01-09 DIAGNOSIS — C91Z0 Other lymphoid leukemia not having achieved remission: Secondary | ICD-10-CM | POA: Diagnosis not present

## 2013-01-09 DIAGNOSIS — I10 Essential (primary) hypertension: Secondary | ICD-10-CM | POA: Diagnosis not present

## 2013-01-09 DIAGNOSIS — E119 Type 2 diabetes mellitus without complications: Secondary | ICD-10-CM | POA: Diagnosis not present

## 2013-01-12 DIAGNOSIS — I10 Essential (primary) hypertension: Secondary | ICD-10-CM | POA: Diagnosis not present

## 2013-01-12 DIAGNOSIS — E119 Type 2 diabetes mellitus without complications: Secondary | ICD-10-CM | POA: Diagnosis not present

## 2013-01-12 DIAGNOSIS — C91Z0 Other lymphoid leukemia not having achieved remission: Secondary | ICD-10-CM | POA: Diagnosis not present

## 2013-01-17 DIAGNOSIS — I10 Essential (primary) hypertension: Secondary | ICD-10-CM | POA: Diagnosis not present

## 2013-01-17 DIAGNOSIS — C91Z0 Other lymphoid leukemia not having achieved remission: Secondary | ICD-10-CM | POA: Diagnosis not present

## 2013-01-17 DIAGNOSIS — E119 Type 2 diabetes mellitus without complications: Secondary | ICD-10-CM | POA: Diagnosis not present

## 2013-01-21 DIAGNOSIS — I10 Essential (primary) hypertension: Secondary | ICD-10-CM | POA: Diagnosis not present

## 2013-01-21 DIAGNOSIS — C91Z0 Other lymphoid leukemia not having achieved remission: Secondary | ICD-10-CM | POA: Diagnosis not present

## 2013-01-21 DIAGNOSIS — E119 Type 2 diabetes mellitus without complications: Secondary | ICD-10-CM | POA: Diagnosis not present

## 2013-01-23 DIAGNOSIS — I10 Essential (primary) hypertension: Secondary | ICD-10-CM | POA: Diagnosis not present

## 2013-01-23 DIAGNOSIS — C91Z0 Other lymphoid leukemia not having achieved remission: Secondary | ICD-10-CM | POA: Diagnosis not present

## 2013-01-23 DIAGNOSIS — Z48815 Encounter for surgical aftercare following surgery on the digestive system: Secondary | ICD-10-CM | POA: Diagnosis not present

## 2013-01-23 DIAGNOSIS — E119 Type 2 diabetes mellitus without complications: Secondary | ICD-10-CM | POA: Diagnosis not present

## 2013-01-26 DIAGNOSIS — Z48815 Encounter for surgical aftercare following surgery on the digestive system: Secondary | ICD-10-CM | POA: Diagnosis not present

## 2013-01-26 DIAGNOSIS — C91Z0 Other lymphoid leukemia not having achieved remission: Secondary | ICD-10-CM | POA: Diagnosis not present

## 2013-01-26 DIAGNOSIS — I10 Essential (primary) hypertension: Secondary | ICD-10-CM | POA: Diagnosis not present

## 2013-01-26 DIAGNOSIS — E119 Type 2 diabetes mellitus without complications: Secondary | ICD-10-CM | POA: Diagnosis not present

## 2013-01-27 DIAGNOSIS — Z48815 Encounter for surgical aftercare following surgery on the digestive system: Secondary | ICD-10-CM | POA: Diagnosis not present

## 2013-01-27 DIAGNOSIS — I10 Essential (primary) hypertension: Secondary | ICD-10-CM | POA: Diagnosis not present

## 2013-01-27 DIAGNOSIS — C91Z0 Other lymphoid leukemia not having achieved remission: Secondary | ICD-10-CM | POA: Diagnosis not present

## 2013-01-27 DIAGNOSIS — E119 Type 2 diabetes mellitus without complications: Secondary | ICD-10-CM | POA: Diagnosis not present

## 2013-02-03 ENCOUNTER — Ambulatory Visit (INDEPENDENT_AMBULATORY_CARE_PROVIDER_SITE_OTHER): Payer: Medicare Other | Admitting: Internal Medicine

## 2013-02-03 ENCOUNTER — Encounter: Payer: Self-pay | Admitting: Internal Medicine

## 2013-02-03 VITALS — BP 150/68 | HR 84 | Temp 97.4°F | Ht 61.5 in | Wt 140.0 lb

## 2013-02-03 DIAGNOSIS — N950 Postmenopausal bleeding: Secondary | ICD-10-CM | POA: Diagnosis not present

## 2013-02-03 DIAGNOSIS — K5900 Constipation, unspecified: Secondary | ICD-10-CM | POA: Diagnosis not present

## 2013-02-03 DIAGNOSIS — K219 Gastro-esophageal reflux disease without esophagitis: Secondary | ICD-10-CM | POA: Diagnosis not present

## 2013-02-03 DIAGNOSIS — R609 Edema, unspecified: Secondary | ICD-10-CM

## 2013-02-03 MED ORDER — ATORVASTATIN CALCIUM 20 MG PO TABS: 20.0000 mg | ORAL_TABLET | Freq: Every day | ORAL | Status: DC

## 2013-02-03 MED ORDER — FUROSEMIDE 20 MG PO TABS: 20.0000 mg | ORAL_TABLET | Freq: Every day | ORAL | Status: DC

## 2013-02-03 MED ORDER — METFORMIN HCL 1000 MG PO TABS: 1000.0000 mg | ORAL_TABLET | Freq: Every day | ORAL | Status: DC

## 2013-02-03 MED ORDER — PIOGLITAZONE HCL 30 MG PO TABS: 30.0000 mg | ORAL_TABLET | Freq: Every day | ORAL | Status: DC

## 2013-02-03 MED ORDER — AMLODIPINE BESYLATE 5 MG PO TABS: 5.0000 mg | ORAL_TABLET | Freq: Every day | ORAL | Status: DC

## 2013-02-03 MED ORDER — METOPROLOL TARTRATE 25 MG PO TABS: 12.5000 mg | ORAL_TABLET | Freq: Every morning | ORAL | Status: DC

## 2013-02-03 MED ORDER — HYDROCHLOROTHIAZIDE 25 MG PO TABS: 25.0000 mg | ORAL_TABLET | Freq: Every day | ORAL | Status: DC

## 2013-02-03 MED ORDER — IRBESARTAN 300 MG PO TABS: 300.0000 mg | ORAL_TABLET | Freq: Every day | ORAL | Status: DC

## 2013-02-03 NOTE — Assessment & Plan Note (Addendum)
Etiology unclear, to hold hct for now, start lasix 20 qd, check daily wts, may need 20 bid, or higher dosing depending on renal fxn, consider add K supp, for labs as well, f/u 1 wk, June 2014 echo reviewed with pt, as well as nov 24 cxr  Note:  Total time for pt hx, exam, review of record with pt in the room, determination of diagnoses and plan for further eval and tx is > 40 min, with over 50% spent in coordination and counseling of patient

## 2013-02-03 NOTE — Assessment & Plan Note (Addendum)
Quite concerning, doubt anemia related, but for GYN referral, has not has tah - r/o cervical/uterine ca

## 2013-02-03 NOTE — Patient Instructions (Signed)
Ok to stop the Hydrochlorothiazide (fluid pill) Please take all new medication as prescribed - the lasix 20 mg per day (the slightly stronger fluid pill) Please also try the OTC Nexium - 1 per day samples Please also try the OTC Miralax samples - 1 per day  You will be contacted regarding the referral for: GYN  Please go to the LAB in the Basement (turn left off the elevator) for the tests to be done tomorrow or as soon as you can You will be contacted by phone if any changes need to be made immediately.  Otherwise, you will receive a letter about your results with an explanation, but please check with MyChart first.  Please remember to sign up for My Chart if you have not done so, as this will be important to you in the future with finding out test results, communicating by private email, and scheduling acute appointments online when needed.  Please return in 1 week, or sooner if needed

## 2013-02-03 NOTE — Progress Notes (Signed)
Subjective:    Patient ID: Norma Smith, female    DOB: [birth year], 80 y.o.   MRN: [id number]  HPI  Here to f/u, s/p ruptured appendix nov 2014 , admits to increased salt in diet recent, now with 4 wks grad worsening LE swelling, despite good med complaince, including the hct 25 mg.  No knee pain or effusion.  Has known mod AS, HOCM, CLL hx, recent anemia post op with AKI last eval dec 2014.  Swelling does not decrease much at night.  Pt denies chest pain, increased sob or doe, wheezing, orthopnea, PND, increased LE swelling, palpitations, dizziness or syncope.  Pt denies new neurological symptoms such as new headache, or facial or extremity weakness or numbness   Pt denies polydipsia, polyuria.  Does have occas sour brash and reflux symptoms but Denies abd pain, dysphagia, n/v, or blood, but has has also recent constipation, not yet tried miralax otc.  Dulcolox too strong. Also mentions 1 wk ago several episodes of vaginal spotting, starting jelly like consistency then more think BRB,  No rectal bleeding.   Past Medical History  Diagnosis Date  . Skin cancer   . Iron deficiency 12/01/2010  . Allergy   . Diabetes mellitus   . Hyperlipidemia   . Hypertension   . Anemia   . Osteopenia   . Dry skin dermatitis 12/01/2010  . CLL (chronic lymphoblastic leukemia) 12/01/2010  . Aortic stenosis, moderate 06/16/2011  . Blood transfusion without reported diagnosis 2010?  Marland Kitchen Stroke     1999   Past Surgical History  Procedure Laterality Date  . Breast biopsy  1976  . Cataract extraction    . Laparoscopic appendectomy N/A 12/13/2012    Procedure: APPENDECTOMY LAPAROSCOPIC;  Surgeon: Odis Hollingshead, MD;  Location: [facility];  Service: General;  Laterality: N/A;    reports that she has quit smoking. She does not have any smokeless tobacco history on file. She reports that she does not drink alcohol or use illicit drugs. family history includes Cancer in her maternal aunt and other; Diabetes in her mother,  other, and other; Heart disease in her maternal aunt and other. Allergies  Allergen Reactions  . Oxycodone Other (See Comments)    Visual Hallucinations  . Roxanol [Morphine] Hives and Rash  . Codeine Other (See Comments)    Passed out  . Penicillins Rash   Current Outpatient Prescriptions on File Prior to Visit  Medication Sig Dispense Refill  . acetaminophen (TYLENOL) 325 MG tablet Take 650 mg by mouth as needed for mild pain.       Marland Kitchen aspirin 81 MG tablet Take 81 mg by mouth daily.        . Calcium Carbonate-Vitamin D (CALCIUM-D) 600-400 MG-UNIT TABS Take by mouth 2 (two) times daily.        . diazepam (VALIUM) 5 MG tablet Take 2.5 mg by mouth daily.      . Ferrous Sulfate (IRON) 325 (65 FE) MG TABS Take by mouth 2 (two) times daily.        . Multiple Vitamins-Minerals (CENTRUM SILVER PO) Take by mouth daily.         No current facility-administered medications on file prior to visit.     Review of Systems  Constitutional: Negative for unexpected weight change, or unusual diaphoresis  HENT: Negative for tinnitus.   Eyes: Negative for photophobia and visual disturbance.  Respiratory: Negative for choking and stridor.   Gastrointestinal: Negative for vomiting and blood in stool.  Genitourinary: Negative for hematuria and decreased urine volume.  Musculoskeletal: Negative for acute joint swelling Skin: Negative for color change and wound.  Neurological: Negative for tremors and numbness other than noted  Psychiatric/Behavioral: Negative for decreased concentration or  hyperactivity.       Objective:   Physical Exam BP 150/68  Pulse 84  Temp(Src) 97.4 F (36.3 C) (Oral)  Ht 5' 1.5" (1.562 m)  Wt 140 lb (63.504 kg)  BMI 26.03 kg/m2  SpO2 95% VS noted,  Constitutional: Pt appears well-developed and well-nourished.  HENT: Head: NCAT.  Right Ear: External ear normal.  Left Ear: External ear normal.  Eyes: Conjunctivae and EOM are normal. Pupils are equal, round, and  reactive to light.  Neck: Normal range of motion. Neck supple.  Cardiovascular: Normal rate and regular rhythm.   Pulmonary/Chest: Effort normal and breath sounds normal.  Abd:  Soft, NT, non-distended, + BS Neurological: Pt is alert. Not confused  Skin: Skin is warm. No erythema. 1+ edema bilat LE to knees Psychiatric: Pt behavior is normal. Thought content normal.     Assessment & Plan:

## 2013-02-03 NOTE — Progress Notes (Signed)
Pre-visit discussion using our clinic review tool. No additional management support is needed unless otherwise documented below in the visit note.  

## 2013-02-03 NOTE — Assessment & Plan Note (Signed)
Reviewed nov 22 abd ct with pt, consider colonoscopy/gi referral, for miralax - gave samples, f/u 1 wk

## 2013-02-03 NOTE — Assessment & Plan Note (Signed)
Gave samples nexium otc,  to f/u any worsening symptoms or concerns

## 2013-02-04 ENCOUNTER — Other Ambulatory Visit (INDEPENDENT_AMBULATORY_CARE_PROVIDER_SITE_OTHER): Payer: Medicare Other

## 2013-02-04 ENCOUNTER — Telehealth: Payer: Self-pay | Admitting: Internal Medicine

## 2013-02-04 DIAGNOSIS — R609 Edema, unspecified: Secondary | ICD-10-CM

## 2013-02-04 LAB — BASIC METABOLIC PANEL
BUN: 23 mg/dL (ref 6–23)
CALCIUM: 10.8 mg/dL — AB (ref 8.4–10.5)
CHLORIDE: 104 meq/L (ref 96–112)
CO2: 29 mEq/L (ref 19–32)
Creatinine, Ser: 1.4 mg/dL — ABNORMAL HIGH (ref 0.4–1.2)
GFR: 37.48 mL/min — ABNORMAL LOW (ref >60.00)
Glucose, Bld: 121 mg/dL — ABNORMAL HIGH (ref 70–99)
Potassium: 3.3 mEq/L — ABNORMAL LOW (ref 3.5–5.1)
Sodium: 140 mEq/L (ref 135–145)

## 2013-02-04 LAB — URINALYSIS, ROUTINE W REFLEX MICROSCOPIC
BILIRUBIN URINE: NEGATIVE
KETONES UR: NEGATIVE
Nitrite: NEGATIVE
Specific Gravity, Urine: 1.025 (ref 1.000–1.030)
Total Protein, Urine: 30 — AB
URINE GLUCOSE: NEGATIVE
Urobilinogen, UA: 0.2 (ref 0.0–1.0)
pH: 5.5 (ref 5.0–8.0)

## 2013-02-04 LAB — HEPATIC FUNCTION PANEL
ALT: 16 U/L (ref 0–35)
AST: 28 U/L (ref 0–37)
Albumin: 3.4 g/dL — ABNORMAL LOW (ref 3.5–5.2)
Alkaline Phosphatase: 43 U/L (ref 39–117)
Bilirubin, Direct: 0.1 mg/dL (ref 0.0–0.3)
Total Bilirubin: 0.6 mg/dL (ref 0.3–1.2)
Total Protein: 6.6 g/dL (ref 6.0–8.3)

## 2013-02-04 LAB — CBC WITH DIFFERENTIAL/PLATELET
BASOS PCT: 0.6 % (ref 0.0–3.0)
Basophils Absolute: 0.1 10*3/uL (ref 0.0–0.1)
Eosinophils Absolute: 0.2 10*3/uL (ref 0.0–0.7)
Eosinophils Relative: 2.1 % (ref 0.0–5.0)
HCT: 26.3 % — ABNORMAL LOW (ref 36.0–46.0)
HEMOGLOBIN: 9 g/dL — AB (ref 12.0–15.0)
LYMPHS ABS: 6.3 10*3/uL — AB (ref 0.7–4.0)
Lymphocytes Relative: 54.3 % — ABNORMAL HIGH (ref 12.0–46.0)
MCHC: 34.1 g/dL (ref 30.0–36.0)
MCV: 88.8 fl (ref 78.0–100.0)
MONO ABS: 0.6 10*3/uL (ref 0.1–1.0)
Monocytes Relative: 5.3 % (ref 3.0–12.0)
NEUTROS ABS: 4.4 10*3/uL (ref 1.4–7.7)
Neutrophils Relative %: 37.7 % — ABNORMAL LOW (ref 43.0–77.0)
Platelets: 173 10*3/uL (ref 150.0–400.0)
RBC: 2.96 Mil/uL — AB (ref 3.87–5.11)
RDW: 18.6 % — ABNORMAL HIGH (ref 11.5–14.6)
WBC: 11.6 10*3/uL — ABNORMAL HIGH (ref 4.5–10.5)

## 2013-02-04 MED ORDER — CIPROFLOXACIN HCL 250 MG PO TABS: 250.0000 mg | ORAL_TABLET | Freq: Two times a day (BID) | ORAL | Status: DC

## 2013-02-04 NOTE — Telephone Encounter (Signed)
Etiology of d/c not clear, but likely UTI by UA - for cipro course, done erx

## 2013-02-04 NOTE — Telephone Encounter (Signed)
Message copied by Biagio Borg on Wed Feb 04, 2013  6:49 PM ------      Message from: Sharon Seller B      Created: Wed Feb 04, 2013  6:47 PM       Called the patient and she has been having some discharge, but no other symptoms at this time. ------

## 2013-02-05 NOTE — Telephone Encounter (Signed)
Patient informed. 

## 2013-02-06 ENCOUNTER — Encounter: Payer: Self-pay | Admitting: Medical

## 2013-02-10 ENCOUNTER — Encounter: Payer: Self-pay | Admitting: Internal Medicine

## 2013-02-10 ENCOUNTER — Ambulatory Visit (INDEPENDENT_AMBULATORY_CARE_PROVIDER_SITE_OTHER): Payer: Medicare Other | Admitting: Internal Medicine

## 2013-02-10 VITALS — BP 120/70 | HR 91 | Temp 98.2°F | Ht 61.5 in | Wt 139.5 lb

## 2013-02-10 DIAGNOSIS — I10 Essential (primary) hypertension: Secondary | ICD-10-CM

## 2013-02-10 DIAGNOSIS — R609 Edema, unspecified: Secondary | ICD-10-CM | POA: Diagnosis not present

## 2013-02-10 DIAGNOSIS — F3289 Other specified depressive episodes: Secondary | ICD-10-CM

## 2013-02-10 DIAGNOSIS — F329 Major depressive disorder, single episode, unspecified: Secondary | ICD-10-CM | POA: Diagnosis not present

## 2013-02-10 DIAGNOSIS — F32A Depression, unspecified: Secondary | ICD-10-CM

## 2013-02-10 MED ORDER — FUROSEMIDE 40 MG PO TABS: 40.0000 mg | ORAL_TABLET | Freq: Every day | ORAL | Status: DC

## 2013-02-10 NOTE — Patient Instructions (Signed)
Ok to increase the furosemide to 40 mg in the AM only Please call in 1 wk if the weight is not improved from your 138 at home Please continue all other medications as before, and refills have been done if requested. Please have the pharmacy call with any other refills you may need.  Please return in 2 weeks, as we will need your Weight, BP, exam for swelling and re-check kidney function and potassium at that time

## 2013-02-10 NOTE — Progress Notes (Signed)
Subjective:    Patient ID: Norma Smith, female    DOB: December 01, 1933, 80 y.o.   MRN: [id number]  HPI  Here to f/u, essentially no change in ankle/feet swelling, despite good compliance with low dose lasix, and Pt denies chest pain, increased sob or doe, wheezing, orthopnea, PND, increased LE swelling, palpitations, dizziness or syncope.  Pt denies new neurological symptoms such as new headache, or facial or extremity weakness or numbness  Denies worsening depressive symptoms, suicidal ideation, or panic  Past Medical History  Diagnosis Date  . Skin cancer   . Iron deficiency 12/01/2010  . Allergy   . Diabetes mellitus   . Hyperlipidemia   . Hypertension   . Anemia   . Osteopenia   . Dry skin dermatitis 12/01/2010  . CLL (chronic lymphoblastic leukemia) 12/01/2010  . Aortic stenosis, moderate 06/16/2011  . Blood transfusion without reported diagnosis 2010?  Marland Kitchen Stroke     1999   Past Surgical History  Procedure Laterality Date  . Breast biopsy  1976  . Cataract extraction    . Laparoscopic appendectomy N/A 12/13/2012    Procedure: APPENDECTOMY LAPAROSCOPIC;  Surgeon: Odis Hollingshead, MD;  Location: [facility];  Service: General;  Laterality: N/A;    reports that she has quit smoking. She does not have any smokeless tobacco history on file. She reports that she does not drink alcohol or use illicit drugs. family history includes Cancer in her maternal aunt and other; Diabetes in her mother, other, and other; Heart disease in her maternal aunt and other. Allergies  Allergen Reactions  . Oxycodone Other (See Comments)    Visual Hallucinations  . Roxanol [Morphine] Hives and Rash  . Codeine Other (See Comments)    Passed out  . Penicillins Rash   Current Outpatient Prescriptions on File Prior to Visit  Medication Sig Dispense Refill  . acetaminophen (TYLENOL) 325 MG tablet Take 650 mg by mouth as needed for mild pain.       Marland Kitchen amLODipine (NORVASC) 5 MG tablet Take 1 tablet (5 mg total)  by mouth daily.  90 tablet  3  . aspirin 81 MG tablet Take 81 mg by mouth daily.        Marland Kitchen atorvastatin (LIPITOR) 20 MG tablet Take 1 tablet (20 mg total) by mouth daily.  90 tablet  3  . Calcium Carbonate-Vitamin D (CALCIUM-D) 600-400 MG-UNIT TABS Take by mouth 2 (two) times daily.        . diazepam (VALIUM) 5 MG tablet Take 2.5 mg by mouth daily.      . Ferrous Sulfate (IRON) 325 (65 FE) MG TABS Take by mouth 2 (two) times daily.        . irbesartan (AVAPRO) 300 MG tablet Take 1 tablet (300 mg total) by mouth at bedtime.  90 tablet  3  . metFORMIN (GLUCOPHAGE) 1000 MG tablet Take 1 tablet (1,000 mg total) by mouth daily.  90 tablet  3  . metoprolol tartrate (LOPRESSOR) 25 MG tablet Take 0.5 tablets (12.5 mg total) by mouth every morning.  90 tablet  3  . Multiple Vitamins-Minerals (CENTRUM SILVER PO) Take by mouth daily.        . pioglitazone (ACTOS) 30 MG tablet Take 1 tablet (30 mg total) by mouth daily.  90 tablet  3   No current facility-administered medications on file prior to visit.   Review of Systems  Constitutional: Negative for unexpected weight change, or unusual diaphoresis  HENT: Negative for tinnitus.  Eyes: Negative for photophobia and visual disturbance.  Respiratory: Negative for choking and stridor.   Gastrointestinal: Negative for vomiting and blood in stool.  Genitourinary: Negative for hematuria and decreased urine volume.  Musculoskeletal: Negative for acute joint swelling Skin: Negative for color change and wound.  Neurological: Negative for tremors and numbness other than noted  Psychiatric/Behavioral: Negative for decreased concentration or  hyperactivity.       Objective:   Physical Exam BP 120/70  Pulse 91  Temp(Src) 98.2 F (36.8 C) (Oral)  Ht 5' 1.5" (1.562 m)  Wt 139 lb 8 oz (63.277 kg)  BMI 25.93 kg/m2  SpO2 95% VS noted,  Constitutional: Pt appears well-developed and well-nourished.  HENT: Head: NCAT.  Right Ear: External ear normal.  Left  Ear: External ear normal.  Eyes: Conjunctivae and EOM are normal. Pupils are equal, round, and reactive to light.  Neck: Normal range of motion. Neck supple.  Cardiovascular: Normal rate and regular rhythm.   Pulmonary/Chest: Effort normal and breath sounds normal.  Abd:  Soft, NT, non-distended, + BS Neurological: Pt is alert. Not confused  Skin: has 1-2+ bilat ankle edema Psychiatric: Pt behavior is normal. Thought content normal. not depressed affect    Assessment & Plan:

## 2013-02-10 NOTE — Progress Notes (Signed)
Pre-visit discussion using our clinic review tool. No additional management support is needed unless otherwise documented below in the visit note.  

## 2013-02-13 ENCOUNTER — Encounter: Payer: Self-pay | Admitting: Internal Medicine

## 2013-02-13 NOTE — Progress Notes (Signed)
Patient ID: Norma Smith, female   DOB: Aug 13, 1933, 80 y.o.   MRN: [id number]               HISTORY & PHYSICAL  DATE: 12/24/2012     FACILITY: [facility]  LEVEL OF CARE: SNF (31)  ALLERGIES:  Allergies  Allergen Reactions  . Oxycodone Other (See Comments)    Visual Hallucinations  . Roxanol [Morphine] Hives and Rash  . Codeine Other (See Comments)    Passed out  . Penicillins Rash    CHIEF COMPLAINT:  Manage appendicitis, hypertension, and aortic stenosis.    HISTORY OF PRESENT ILLNESS:  The patient is a 80 year-old, Caucasian female.    APPENDICITIS:  The patient was hospitalized and diagnosed with a perforated appendicitis with free air.    She had an appendectomy with drain placement.  She is admitted to this facility for short-term rehabilitation.  She denies further abdominal pain, nausea or vomiting.     HTN: Pt 's HTN remains stable.  Denies CP, sob, DOE, pedal edema, headaches, dizziness or visual disturbances.  No complications from the medications currently being used.  Last BP :   130/86.    AORTIC STENOSIS: The aortic stenosis remains stable.  Patient denies SOB, DOE, dizziness or syncopal episodes.  Patient is currently being followed by the cardiologist.    PAST MEDICAL HISTORY :  Past Medical History  Diagnosis Date  . Skin cancer   . Iron deficiency 12/01/2010  . Allergy   . Diabetes mellitus   . Hyperlipidemia   . Hypertension   . Anemia   . Osteopenia   . Dry skin dermatitis 12/01/2010  . CLL (chronic lymphoblastic leukemia) 12/01/2010  . Aortic stenosis, moderate 06/16/2011  . Blood transfusion without reported diagnosis 2010?  Marland Kitchen Stroke     1999    PAST SURGICAL HISTORY: Past Surgical History  Procedure Laterality Date  . Breast biopsy  1976  . Cataract extraction    . Laparoscopic appendectomy N/A 12/13/2012    Procedure: APPENDECTOMY LAPAROSCOPIC;  Surgeon: Odis Hollingshead, MD;  Location: [facility];  Service: General;   Laterality: N/A;    SOCIAL HISTORY:  reports that she has quit smoking. She does not have any smokeless tobacco history on file. She reports that she does not drink alcohol or use illicit drugs.  FAMILY HISTORY:  Family History  Problem Relation Age of Onset  . Diabetes Other   . Cancer Other     breast cancer  . Heart disease Other   . Diabetes Other   . Diabetes Mother   . Cancer Maternal Aunt     breast  . Heart disease Maternal Aunt     CURRENT MEDICATIONS: Reviewed per [facility]  REVIEW OF SYSTEMS:  See HPI otherwise 14 point ROS is negative.  PHYSICAL EXAMINATION  VS:  T 98.4       P 79      RR 18      BP 130/86      POX 98% room air        GENERAL: no acute distress, normal body habitus EYES: conjunctivae normal, sclerae normal, normal eye lids MOUTH/THROAT: lips without lesions,no lesions in the mouth,tongue is without lesions,uvula elevates in midline NECK: supple, trachea midline, no neck masses, no thyroid tenderness, no thyromegaly LYMPHATICS: no LAN in the neck, no supraclavicular LAN RESPIRATORY: breathing is even & unlabored, BS CTAB CARDIAC: RRR, no murmur,no extra heart sounds EDEMA/VARICOSITIES:  +2 bilateral  lower extremity edema    GI:  ABDOMEN: abdomen soft, normal BS, no masses, no tenderness  LIVER/SPLEEN: no hepatomegaly, no splenomegaly MUSCULOSKELETAL: HEAD: normal to inspection & palpation BACK: no kyphosis, scoliosis or spinal processes tenderness EXTREMITIES: LEFT UPPER EXTREMITY: full range of motion, normal strength & tone RIGHT UPPER EXTREMITY:  full range of motion, normal strength & tone LEFT LOWER EXTREMITY:  full range of motion, normal strength & tone RIGHT LOWER EXTREMITY:  full range of motion, normal strength & tone PSYCHIATRIC: the patient is alert & oriented to person, affect & behavior appropriate  LABS/RADIOLOGY:  Labs reviewed: Basic Metabolic Panel:  Recent Labs  12/18/12 0420 12/21/12 0415 02/04/13 1401  NA 138 141  140  K 3.9 3.7 3.3*  CL 107 109 104  CO2 24 23 29   GLUCOSE 112* 114* 121*  BUN 18 14 23   CREATININE 0.90 0.91 1.4*  CALCIUM 8.3* 8.0* 10.8*   Liver Function Tests:  Recent Labs  12/13/12 1835 12/15/12 0341 02/04/13 1401  AST 22 21 28   ALT 10 9 16   ALKPHOS 45 47 43  BILITOT 0.7 0.2* 0.6  PROT 6.8 5.1* 6.6  ALBUMIN 3.3* 2.1* 3.4*    Recent Labs  12/13/12 1835  LIPASE 12   CBC:  Recent Labs  12/01/12 0949 12/13/12 1835  12/21/12 0415 12/22/12 1200 02/04/13 1401  WBC 8.4 20.4*  < > 15.1* 16.6* 11.6*  NEUTROABS 4.1 14.5*  --   --   --  4.4  HGB 10.7* 10.5*  < > 8.2* 9.0* 9.0*  HCT 32.7* 31.0*  < > 24.8* 27.0* 26.3 Repeated and verified X2.*  MCV 96.7 95.7  < > 94.3 94.1 88.8  PLT 175 216  < > 359 405* 173.0  < > = values in this interval not displayed.  Lipid Panel:  Recent Labs  06/11/12 1011  HDL 59.50   CBG:  Recent Labs  12/21/12 2149 12/22/12 0746 12/22/12 1201  GLUCAP 118* 79 101*    ASSESSMENT/PLAN:  Appendicitis.  Status post appendectomy.  Continue wound care and rehabilitation.    Hypertension.  Well controlled.    Aortic stenosis.  Stable.    Hypertrophic obstructive cardiomyopathy (425.11).  Compensated.    Chronic lymphocytic leukemia.  No acute issues currently.    Check CBC and BMP.    THN Metrics:   Aspirin 81 mg.  Former smoker.    I have reviewed patient's medical records received at admission/from hospitalization.  CPT CODE: 60454

## 2013-02-17 ENCOUNTER — Telehealth: Payer: Self-pay | Admitting: Internal Medicine

## 2013-02-17 NOTE — Assessment & Plan Note (Signed)
stable overall by history and exam, and pt to continue medical treatment as before,  to f/u any worsening symptoms or concerns 

## 2013-02-17 NOTE — Telephone Encounter (Signed)
Has she been able to check her weights at home?  If not, would need OV to document the weight and swelling before any medication could be changed, as too much medication can cause side effects such as dehydration

## 2013-02-17 NOTE — Assessment & Plan Note (Addendum)
Wt stable, edema no significant change, ok to incr the lasix to 40 mg daily, to check daily wts and call with results mid next wk; most recent June 2014 echo reviewed with pt, c/w diastolic dysfxn>? Though this is not noted; would overall need to be cautious given the aortic valve  Study Conclusions  - Left ventricle: LVOT is narrow at 16 mm. There is systolic anterior motion of the mitral valve leaflets. Peak and mean gradients through LVOT at rest are 25 and 13 mm Hg respectively. With Valsalvathe gradient the gradients are 90 and 53 mm Hg respectively. The cavity size was normal. Wall thickness was increased in a pattern of moderate LVH. Systolic function was vigorous. The estimated ejection fraction was in the range of 65% to 70%. - Aortic valve: AV is thickened, calcified with mildly restricted motion. Peak andmean gradients through the valve at rest are 44 and 23 mm Hg respectively. There is no significant AI. - Pulmonary arteries: PA peak pressure: 80mm Hg (S).

## 2013-02-17 NOTE — Assessment & Plan Note (Signed)
stable overall by history and exam, recent data reviewed with pt, and pt to continue medical treatment as before,  to f/u any worsening symptoms or concerns BP Readings from Last 3 Encounters:  02/10/13 120/70  02/03/13 150/68  01/06/13 110/52

## 2013-02-17 NOTE — Telephone Encounter (Signed)
Patient called and left a VM on triage line to notify Dr. Jenny Reichmann that she is still having swelling in her ankles. States that she was told to call today to give an update and although the swelling is not as bad she does not think the medication is working for her swelling. Please advise.

## 2013-02-18 NOTE — Telephone Encounter (Signed)
Called left message to call back 

## 2013-02-19 NOTE — Telephone Encounter (Signed)
Ok to follow for now, but if not improving on this dose lasix, a higher dose may not help, as this would be more likely venous insufficiency (varicose veins leading to swelling); this can be further addressed next visit

## 2013-02-19 NOTE — Telephone Encounter (Signed)
Patient is returning missed call.

## 2013-02-19 NOTE — Telephone Encounter (Signed)
The patient did return my call and she has been weighing everyday, but not today.  Her weight has been fluctuating between 137 - 139, yesterday it was 138. Patient has appointment on Tuesday.

## 2013-02-19 NOTE — Telephone Encounter (Signed)
Called left message to call back 

## 2013-02-20 NOTE — Telephone Encounter (Signed)
Called the patient left a detailed message of MD instructions.

## 2013-02-24 ENCOUNTER — Ambulatory Visit (INDEPENDENT_AMBULATORY_CARE_PROVIDER_SITE_OTHER): Payer: Medicare Other | Admitting: Internal Medicine

## 2013-02-24 ENCOUNTER — Encounter: Payer: Self-pay | Admitting: Internal Medicine

## 2013-02-24 ENCOUNTER — Other Ambulatory Visit (INDEPENDENT_AMBULATORY_CARE_PROVIDER_SITE_OTHER): Payer: Medicare Other

## 2013-02-24 VITALS — BP 160/68 | HR 68 | Temp 97.6°F | Wt 137.1 lb

## 2013-02-24 DIAGNOSIS — I350 Nonrheumatic aortic (valve) stenosis: Secondary | ICD-10-CM

## 2013-02-24 DIAGNOSIS — I421 Obstructive hypertrophic cardiomyopathy: Secondary | ICD-10-CM

## 2013-02-24 DIAGNOSIS — I359 Nonrheumatic aortic valve disorder, unspecified: Secondary | ICD-10-CM

## 2013-02-24 DIAGNOSIS — IMO0001 Reserved for inherently not codable concepts without codable children: Secondary | ICD-10-CM | POA: Diagnosis not present

## 2013-02-24 DIAGNOSIS — R609 Edema, unspecified: Secondary | ICD-10-CM | POA: Diagnosis not present

## 2013-02-24 DIAGNOSIS — E1165 Type 2 diabetes mellitus with hyperglycemia: Secondary | ICD-10-CM

## 2013-02-24 LAB — HEPATIC FUNCTION PANEL
ALBUMIN: 3.9 g/dL (ref 3.5–5.2)
ALT: 15 U/L (ref 0–35)
AST: 23 U/L (ref 0–37)
Alkaline Phosphatase: 42 U/L (ref 39–117)
Bilirubin, Direct: 0.1 mg/dL (ref 0.0–0.3)
TOTAL PROTEIN: 7.3 g/dL (ref 6.0–8.3)
Total Bilirubin: 0.8 mg/dL (ref 0.3–1.2)

## 2013-02-24 LAB — LIPID PANEL
Cholesterol: 180 mg/dL (ref 0–200)
HDL: 54.9 mg/dL (ref >39.00)
LDL CALC: 104 mg/dL — AB (ref 0–99)
TRIGLYCERIDES: 107 mg/dL (ref 0.0–149.0)
Total CHOL/HDL Ratio: 3
VLDL: 21.4 mg/dL (ref 0.0–40.0)

## 2013-02-24 LAB — BASIC METABOLIC PANEL
BUN: 20 mg/dL (ref 6–23)
CHLORIDE: 102 meq/L (ref 96–112)
CO2: 30 meq/L (ref 19–32)
Calcium: 10.4 mg/dL (ref 8.4–10.5)
Creatinine, Ser: 1.2 mg/dL (ref 0.4–1.2)
GFR: 45.81 mL/min — ABNORMAL LOW (ref >60.00)
Glucose, Bld: 111 mg/dL — ABNORMAL HIGH (ref 70–99)
POTASSIUM: 4.2 meq/L (ref 3.5–5.1)
SODIUM: 140 meq/L (ref 135–145)

## 2013-02-24 LAB — HEMOGLOBIN A1C: Hgb A1c MFr Bld: 5.3 % (ref 4.6–6.5)

## 2013-02-24 NOTE — Assessment & Plan Note (Signed)
Suspect likely related to worsening periph edema, again declines f/u with Dr Stanford Breed, as she will not consent to any procedure or surgury

## 2013-02-24 NOTE — Assessment & Plan Note (Signed)
Improved, to cont current meds, check K and bun/cr

## 2013-02-24 NOTE — Progress Notes (Signed)
Pre-visit discussion using our clinic review tool. No additional management support is needed unless otherwise documented below in the visit note.  

## 2013-02-24 NOTE — Progress Notes (Signed)
Subjective:    Patient ID: Norma Smith, female    DOB: Apr 23, 1933, 80 y.o.   MRN: [id number]  HPI  Here to f/u, Pt denies chest pain, increased sob or doe, wheezing, orthopnea, PND, increased LE swelling, palpitations, dizziness or syncope, in fact LE edema has improved now to trace levels mid legs bilat, with 2 lb wt loss as well.  Has known LVOT obstruction, last saw card may 2013, has declined f/u as she was presented with some discussion of cardiac septal intervention, and she just will not consider that..  Pt denies new neurological symptoms such as new headache, or facial or extremity weakness or numbness  Denies worsening depressive symptoms, suicidal ideation, or panic.   Past Medical History  Diagnosis Date  . Skin cancer   . Iron deficiency 12/01/2010  . Allergy   . Diabetes mellitus   . Hyperlipidemia   . Hypertension   . Anemia   . Osteopenia   . Dry skin dermatitis 12/01/2010  . CLL (chronic lymphoblastic leukemia) 12/01/2010  . Aortic stenosis, moderate 06/16/2011  . Blood transfusion without reported diagnosis 2010?  Marland Kitchen Stroke     1999   Past Surgical History  Procedure Laterality Date  . Breast biopsy  1976  . Cataract extraction    . Laparoscopic appendectomy N/A 12/13/2012    Procedure: APPENDECTOMY LAPAROSCOPIC;  Surgeon: Odis Hollingshead, MD;  Location: [facility];  Service: General;  Laterality: N/A;    reports that she has quit smoking. She does not have any smokeless tobacco history on file. She reports that she does not drink alcohol or use illicit drugs. family history includes Cancer in her maternal aunt and other; Diabetes in her mother, other, and other; Heart disease in her maternal aunt and other. Allergies  Allergen Reactions  . Oxycodone Other (See Comments)    Visual Hallucinations  . Roxanol [Morphine] Hives and Rash  . Codeine Other (See Comments)    Passed out  . Penicillins Rash   Current Outpatient Prescriptions on File Prior to Visit    Medication Sig Dispense Refill  . acetaminophen (TYLENOL) 325 MG tablet Take 650 mg by mouth as needed for mild pain.       Marland Kitchen amLODipine (NORVASC) 5 MG tablet Take 1 tablet (5 mg total) by mouth daily.  90 tablet  3  . aspirin 81 MG tablet Take 81 mg by mouth daily.        Marland Kitchen atorvastatin (LIPITOR) 20 MG tablet Take 1 tablet (20 mg total) by mouth daily.  90 tablet  3  . Calcium Carbonate-Vitamin D (CALCIUM-D) 600-400 MG-UNIT TABS Take by mouth 2 (two) times daily.        . diazepam (VALIUM) 5 MG tablet Take 2.5 mg by mouth daily.      . Ferrous Sulfate (IRON) 325 (65 FE) MG TABS Take by mouth 2 (two) times daily.        . furosemide (LASIX) 40 MG tablet Take 1 tablet (40 mg total) by mouth daily.  90 tablet  3  . irbesartan (AVAPRO) 300 MG tablet Take 1 tablet (300 mg total) by mouth at bedtime.  90 tablet  3  . metFORMIN (GLUCOPHAGE) 1000 MG tablet Take 1 tablet (1,000 mg total) by mouth daily.  90 tablet  3  . metoprolol tartrate (LOPRESSOR) 25 MG tablet Take 0.5 tablets (12.5 mg total) by mouth every morning.  90 tablet  3  . Multiple Vitamins-Minerals (CENTRUM SILVER PO) Take by mouth  daily.        . pioglitazone (ACTOS) 30 MG tablet Take 1 tablet (30 mg total) by mouth daily.  90 tablet  3   No current facility-administered medications on file prior to visit.   Review of Systems  Constitutional: Negative for unexpected weight change, or unusual diaphoresis  HENT: Negative for tinnitus.   Eyes: Negative for photophobia and visual disturbance.  Respiratory: Negative for choking and stridor.   Gastrointestinal: Negative for vomiting and blood in stool.  Genitourinary: Negative for hematuria and decreased urine volume.  Musculoskeletal: Negative for acute joint swelling Skin: Negative for color change and wound.  Neurological: Negative for tremors and numbness other than noted  Psychiatric/Behavioral: Negative for decreased concentration or  hyperactivity.       Objective:    Physical Exam BP 160/68  Pulse 68  Temp(Src) 97.6 F (36.4 C) (Oral)  Wt 137 lb 2 oz (62.199 kg)  SpO2 96% VS noted,  Constitutional: Pt appears well-developed and well-nourished.  HENT: Head: NCAT.  Right Ear: External ear normal.  Left Ear: External ear normal.  Eyes: Conjunctivae and EOM are normal. Pupils are equal, round, and reactive to light.  Neck: Normal range of motion. Neck supple.  Cardiovascular: Normal rate and regular rhythm.   Pulmonary/Chest: Effort normal and breath sounds normal.  Abd:  Soft, NT, non-distended, + BS Neurological: Pt is alert. Not confused  Skin: Skin is warm. No erythema. trace edema bilat to mid legs Psychiatric: Pt behavior is normal. Thought content normal.     Assessment & Plan:

## 2013-02-24 NOTE — Assessment & Plan Note (Signed)
No dizziness or signif sob,  to f/u any worsening symptoms or concerns, ehco June 2014 reviewed with pt

## 2013-02-24 NOTE — Assessment & Plan Note (Signed)
stable overall by history and exam, recent data reviewed with pt, and pt to continue medical treatment as before,  to f/u any worsening symptoms or concerns Lab Results  Component Value Date   HGBA1C 4.7 06/11/2012

## 2013-02-24 NOTE — Patient Instructions (Signed)
Please continue all other medications as before, and refills have been done if requested. Please have the pharmacy call with any other refills you may need.  Please go to the LAB in the Basement (turn left off the elevator) for the tests to be done today You will be contacted by phone if any changes need to be made immediately.  Otherwise, you will receive a letter about your results with an explanation, but please check with MyChart first.  Please remember to sign up for My Chart if you have not done so, as this will be important to you in the future with finding out test results, communicating by private email, and scheduling acute appointments online when needed.  Please return in 6 months, or sooner if needed  Please call if you change your mind about seeing Dr Stanford Breed

## 2013-03-02 ENCOUNTER — Other Ambulatory Visit: Payer: Self-pay | Admitting: Nurse Practitioner

## 2013-03-12 ENCOUNTER — Other Ambulatory Visit: Payer: Self-pay | Admitting: Internal Medicine

## 2013-03-12 NOTE — Telephone Encounter (Signed)
Faxed hardcopy to [facility]

## 2013-03-12 NOTE — Telephone Encounter (Signed)
Done hardcopy to robin  

## 2013-03-12 NOTE — Telephone Encounter (Signed)
Patient's pharmacy is calling on behalf of the patient to check the status of the pt's refill. She is almost out and is requesting refill today.

## 2013-04-06 ENCOUNTER — Encounter: Payer: Medicare Other | Admitting: Medical

## 2013-04-10 ENCOUNTER — Ambulatory Visit: Payer: Medicare Other | Admitting: Cardiology

## 2013-05-22 ENCOUNTER — Other Ambulatory Visit: Payer: Self-pay | Admitting: Internal Medicine

## 2013-05-22 NOTE — Telephone Encounter (Signed)
Faxed hardcopy to [facility] 

## 2013-05-22 NOTE — Telephone Encounter (Signed)
Done hardcopy to robin  

## 2013-06-22 DIAGNOSIS — E119 Type 2 diabetes mellitus without complications: Secondary | ICD-10-CM | POA: Diagnosis not present

## 2013-08-25 ENCOUNTER — Ambulatory Visit (INDEPENDENT_AMBULATORY_CARE_PROVIDER_SITE_OTHER): Payer: Medicare Other | Admitting: Internal Medicine

## 2013-08-25 ENCOUNTER — Encounter: Payer: Self-pay | Admitting: Internal Medicine

## 2013-08-25 ENCOUNTER — Other Ambulatory Visit (INDEPENDENT_AMBULATORY_CARE_PROVIDER_SITE_OTHER): Payer: Medicare Other

## 2013-08-25 VITALS — BP 124/78 | HR 80 | Temp 98.7°F | Ht 61.5 in | Wt 135.5 lb

## 2013-08-25 DIAGNOSIS — E785 Hyperlipidemia, unspecified: Secondary | ICD-10-CM | POA: Diagnosis not present

## 2013-08-25 DIAGNOSIS — F411 Generalized anxiety disorder: Secondary | ICD-10-CM

## 2013-08-25 DIAGNOSIS — I10 Essential (primary) hypertension: Secondary | ICD-10-CM | POA: Diagnosis not present

## 2013-08-25 DIAGNOSIS — F419 Anxiety disorder, unspecified: Secondary | ICD-10-CM

## 2013-08-25 DIAGNOSIS — IMO0001 Reserved for inherently not codable concepts without codable children: Secondary | ICD-10-CM | POA: Diagnosis not present

## 2013-08-25 DIAGNOSIS — E1165 Type 2 diabetes mellitus with hyperglycemia: Principal | ICD-10-CM

## 2013-08-25 LAB — URINALYSIS, ROUTINE W REFLEX MICROSCOPIC
Bilirubin Urine: NEGATIVE
KETONES UR: NEGATIVE
Leukocytes, UA: NEGATIVE
Nitrite: NEGATIVE
PH: 6 (ref 5.0–8.0)
SPECIFIC GRAVITY, URINE: 1.015 (ref 1.000–1.030)
TOTAL PROTEIN, URINE-UPE24: 100 — AB
URINE GLUCOSE: NEGATIVE
UROBILINOGEN UA: 0.2 (ref 0.0–1.0)

## 2013-08-25 LAB — HEPATIC FUNCTION PANEL
ALK PHOS: 44 U/L (ref 39–117)
ALT: 12 U/L (ref 0–35)
AST: 24 U/L (ref 0–37)
Albumin: 3.7 g/dL (ref 3.5–5.2)
Bilirubin, Direct: 0 mg/dL (ref 0.0–0.3)
TOTAL PROTEIN: 6.8 g/dL (ref 6.0–8.3)
Total Bilirubin: 0.6 mg/dL (ref 0.2–1.2)

## 2013-08-25 LAB — LIPID PANEL
CHOL/HDL RATIO: 3
Cholesterol: 174 mg/dL (ref 0–200)
HDL: 56.3 mg/dL (ref >39.00)
LDL Cholesterol: 102 mg/dL — ABNORMAL HIGH (ref 0–99)
NONHDL: 117.7
Triglycerides: 80 mg/dL (ref 0.0–149.0)
VLDL: 16 mg/dL (ref 0.0–40.0)

## 2013-08-25 LAB — CBC WITH DIFFERENTIAL/PLATELET
Basophils Absolute: 0 10*3/uL (ref 0.0–0.1)
Basophils Relative: 0.3 % (ref 0.0–3.0)
Eosinophils Absolute: 0.1 10*3/uL (ref 0.0–0.7)
Eosinophils Relative: 1.2 % (ref 0.0–5.0)
HEMATOCRIT: 30.8 % — AB (ref 36.0–46.0)
HEMOGLOBIN: 10.3 g/dL — AB (ref 12.0–15.0)
LYMPHS ABS: 5.6 10*3/uL — AB (ref 0.7–4.0)
Lymphocytes Relative: 48 % — ABNORMAL HIGH (ref 12.0–46.0)
MCHC: 33.5 g/dL (ref 30.0–36.0)
MCV: 93.9 fl (ref 78.0–100.0)
Monocytes Absolute: 0.6 10*3/uL (ref 0.1–1.0)
Monocytes Relative: 4.8 % (ref 3.0–12.0)
NEUTROS ABS: 5.4 10*3/uL (ref 1.4–7.7)
Neutrophils Relative %: 45.7 % (ref 43.0–77.0)
Platelets: 179 10*3/uL (ref 150.0–400.0)
RBC: 3.28 Mil/uL — AB (ref 3.87–5.11)
RDW: 14.3 % (ref 11.5–15.5)
WBC: 11.7 10*3/uL — ABNORMAL HIGH (ref 4.0–10.5)

## 2013-08-25 LAB — MICROALBUMIN / CREATININE URINE RATIO
CREATININE, U: 86.6 mg/dL
MICROALB UR: 78 mg/dL — AB (ref 0.0–1.9)
Microalb Creat Ratio: 90.1 mg/g — ABNORMAL HIGH (ref 0.0–30.0)

## 2013-08-25 LAB — BASIC METABOLIC PANEL
BUN: 29 mg/dL — ABNORMAL HIGH (ref 6–23)
CALCIUM: 9.4 mg/dL (ref 8.4–10.5)
CO2: 28 meq/L (ref 19–32)
CREATININE: 1.2 mg/dL (ref 0.4–1.2)
Chloride: 103 mEq/L (ref 96–112)
GFR: 46.64 mL/min — AB (ref >60.00)
Glucose, Bld: 95 mg/dL (ref 70–99)
Potassium: 3.8 mEq/L (ref 3.5–5.1)
SODIUM: 141 meq/L (ref 135–145)

## 2013-08-25 LAB — TSH: TSH: 4.54 u[IU]/mL — ABNORMAL HIGH (ref 0.35–4.50)

## 2013-08-25 LAB — HEMOGLOBIN A1C: Hgb A1c MFr Bld: 5.7 % (ref 4.6–6.5)

## 2013-08-25 MED ORDER — IRBESARTAN 300 MG PO TABS: 300.0000 mg | ORAL_TABLET | Freq: Every day | ORAL | Status: DC

## 2013-08-25 MED ORDER — PIOGLITAZONE HCL 15 MG PO TABS: 15.0000 mg | ORAL_TABLET | Freq: Every day | ORAL | Status: DC

## 2013-08-25 MED ORDER — ATORVASTATIN CALCIUM 20 MG PO TABS: 20.0000 mg | ORAL_TABLET | Freq: Every day | ORAL | Status: DC

## 2013-08-25 MED ORDER — AMLODIPINE BESYLATE 5 MG PO TABS: 5.0000 mg | ORAL_TABLET | Freq: Every day | ORAL | Status: DC

## 2013-08-25 MED ORDER — DIAZEPAM 5 MG PO TABS: ORAL_TABLET | ORAL | Status: DC

## 2013-08-25 MED ORDER — FUROSEMIDE 40 MG PO TABS: 40.0000 mg | ORAL_TABLET | Freq: Every day | ORAL | Status: DC

## 2013-08-25 MED ORDER — PIOGLITAZONE HCL 30 MG PO TABS: 30.0000 mg | ORAL_TABLET | Freq: Every day | ORAL | Status: DC

## 2013-08-25 MED ORDER — METOPROLOL TARTRATE 25 MG PO TABS: 12.5000 mg | ORAL_TABLET | Freq: Every morning | ORAL | Status: DC

## 2013-08-25 MED ORDER — METFORMIN HCL 1000 MG PO TABS: 1000.0000 mg | ORAL_TABLET | Freq: Every day | ORAL | Status: DC

## 2013-08-25 NOTE — Patient Instructions (Addendum)
Please avoid NSAIDs in the future due the kidney function  OK to decrease the actos to 15 mg (sent to your pharmacy)  Please continue all other medications as before, and refills have been done if requested.  Please have the pharmacy call with any other refills you may need.  Please continue your efforts at being more active, low cholesterol diet, and weight control.  You are otherwise up to date with prevention measures today.  Please keep your appointments with your specialists as you may have planned  Please go to the LAB in the Basement (turn left off the elevator) for the tests to be done today  You will be contacted by phone if any changes need to be made immediately.  Otherwise, you will receive a letter about your results with an explanation, but please check with MyChart first.  Please return in 6 months, or sooner if needed

## 2013-08-25 NOTE — Progress Notes (Signed)
Pre visit review using our clinic review tool, if applicable. No additional management support is needed unless otherwise documented below in the visit note. 

## 2013-08-25 NOTE — Progress Notes (Signed)
Subjective:    Patient ID: Norma Smith, female    DOB: 1933/10/29, 80 y.o.   MRN: [id number]  HPI  Here to f/u; overall doing ok,  Pt denies chest pain, increased sob or doe, wheezing, orthopnea, PND, increased LE swelling, palpitations, dizziness or syncope.  Pt denies polydipsia, polyuria, or low sugar symptoms such as weakness or confusion improved with po intake.  Pt denies new neurological symptoms such as new headache, or facial or extremity weakness or numbness.   Pt states overall good compliance with meds, has been trying to follow lower cholesterol, diabetic diet, with wt overall stable,  but little exercise however.  Mentions some LE swelling, but none on exam.   Wt by clinic scale is down 5 lbs, appetite ok.   Denies worsening depressive symptoms, suicidal ideation, or panic; has ongoing anxiety, not increased recently, asks for valium refill Past Medical History  Diagnosis Date  . Skin cancer   . Iron deficiency 12/01/2010  . Allergy   . Diabetes mellitus   . Hyperlipidemia   . Hypertension   . Anemia   . Osteopenia   . Dry skin dermatitis 12/01/2010  . CLL (chronic lymphoblastic leukemia) 12/01/2010  . Aortic stenosis, moderate 06/16/2011  . Blood transfusion without reported diagnosis 2010?  Marland Kitchen Stroke     1999   Past Surgical History  Procedure Laterality Date  . Breast biopsy  1976  . Cataract extraction    . Laparoscopic appendectomy N/A 12/13/2012    Procedure: APPENDECTOMY LAPAROSCOPIC;  Surgeon: Odis Hollingshead, MD;  Location: [facility];  Service: General;  Laterality: N/A;    reports that she has quit smoking. She does not have any smokeless tobacco history on file. She reports that she does not drink alcohol or use illicit drugs. family history includes Cancer in her maternal aunt and other; Diabetes in her mother, other, and other; Heart disease in her maternal aunt and other. Allergies  Allergen Reactions  . Oxycodone Other (See Comments)    Visual  Hallucinations  . Roxanol [Morphine] Hives and Rash  . Codeine Other (See Comments)    Passed out  . Penicillins Rash   Current Outpatient Prescriptions on File Prior to Visit  Medication Sig Dispense Refill  . acetaminophen (TYLENOL) 325 MG tablet Take 650 mg by mouth as needed for mild pain.       Marland Kitchen aspirin 81 MG tablet Take 81 mg by mouth daily.        . Calcium Carbonate-Vitamin D (CALCIUM-D) 600-400 MG-UNIT TABS Take by mouth 2 (two) times daily.        . Ferrous Sulfate (IRON) 325 (65 FE) MG TABS Take by mouth 2 (two) times daily.        . Multiple Vitamins-Minerals (CENTRUM SILVER PO) Take by mouth daily.         No current facility-administered medications on file prior to visit.    Review of Systems  Constitutional: Negative for unusual diaphoresis or other sweats  HENT: Negative for ringing in ear Eyes: Negative for double vision or worsening visual disturbance.  Respiratory: Negative for choking and stridor.   Gastrointestinal: Negative for vomiting or other signifcant bowel change Genitourinary: Negative for hematuria or decreased urine volume.  Musculoskeletal: Negative for other MSK pain or swelling Skin: Negative for color change and worsening wound.  Neurological: Negative for tremors and numbness other than noted  Psychiatric/Behavioral: Negative for decreased concentration or agitation other than above  Objective:   Physical Exam BP 124/78  Pulse 80  Temp(Src) 98.7 F (37.1 C) (Oral)  Ht 5' 1.5" (1.562 m)  Wt 135 lb 8 oz (61.462 kg)  BMI 25.19 kg/m2  SpO2 95% VS noted,  Constitutional: Pt appears well-developed, well-nourished.  HENT: Head: NCAT.  Right Ear: External ear normal.  Left Ear: External ear normal.  Eyes: . Pupils are equal, round, and reactive to light. Conjunctivae and EOM are normal Neck: Normal range of motion. Neck supple.  Cardiovascular: Normal rate and regular rhythm.   Pulmonary/Chest: Effort normal and breath sounds normal.    Neurological: Pt is alert. Not confused , motor grossly intact Skin: Skin is warm. No rash Psychiatric: Pt behavior is normal. No agitation. mild nervous    Assessment & Plan:

## 2013-08-26 ENCOUNTER — Encounter: Payer: Self-pay | Admitting: Internal Medicine

## 2013-08-26 ENCOUNTER — Other Ambulatory Visit: Payer: Self-pay | Admitting: Internal Medicine

## 2013-08-26 DIAGNOSIS — R3129 Other microscopic hematuria: Secondary | ICD-10-CM

## 2013-08-30 NOTE — Assessment & Plan Note (Signed)
stable overall by history and exam, recent data reviewed with pt, and pt to continue medical treatment as before,  to f/u any worsening symptoms or concerns Lab Results  Component Value Date   WBC 11.7* 08/25/2013   HGB 10.3* 08/25/2013   HCT 30.8* 08/25/2013   PLT 179.0 08/25/2013   GLUCOSE 95 08/25/2013   CHOL 174 08/25/2013   TRIG 80.0 08/25/2013   HDL 56.30 08/25/2013   LDLCALC 102* 08/25/2013   ALT 12 08/25/2013   AST 24 08/25/2013   NA 141 08/25/2013   K 3.8 08/25/2013   CL 103 08/25/2013   CREATININE 1.2 08/25/2013   BUN 29* 08/25/2013   CO2 28 08/25/2013   TSH 4.54* 08/25/2013   HGBA1C 5.7 08/25/2013   MICROALBUR 78.0* 08/25/2013

## 2013-08-30 NOTE — Assessment & Plan Note (Signed)
stable overall by history and exam, recent data reviewed with pt, and pt to continue medical treatment as before,  to f/u any worsening symptoms or concerns BP Readings from Last 3 Encounters:  08/25/13 124/78  02/24/13 160/68  02/10/13 120/70

## 2013-08-30 NOTE — Assessment & Plan Note (Addendum)
Very well controlled, at risk low sugars , ok to reduce actos 15 qd Lab Results  Component Value Date   HGBA1C 5.7 08/25/2013

## 2013-08-30 NOTE — Assessment & Plan Note (Signed)
stable overall by history and exam, recent data reviewed with pt, and pt to continue medical treatment as before,  to f/u any worsening symptoms or concerns Lab Results  Component Value Date   LDLCALC 102* 08/25/2013

## 2013-09-20 ENCOUNTER — Other Ambulatory Visit: Payer: Self-pay | Admitting: Internal Medicine

## 2013-09-22 NOTE — Telephone Encounter (Signed)
Done hardcopy to robin  

## 2013-09-22 NOTE — Telephone Encounter (Signed)
Faxed hardcopy to [facility] Dr. Letta Kocher

## 2013-10-11 DIAGNOSIS — Z23 Encounter for immunization: Secondary | ICD-10-CM | POA: Diagnosis not present

## 2013-11-27 ENCOUNTER — Other Ambulatory Visit: Payer: Self-pay | Admitting: *Deleted

## 2013-11-27 DIAGNOSIS — C9110 Chronic lymphocytic leukemia of B-cell type not having achieved remission: Secondary | ICD-10-CM

## 2013-11-30 ENCOUNTER — Telehealth: Payer: Self-pay | Admitting: Hematology

## 2013-11-30 ENCOUNTER — Ambulatory Visit (HOSPITAL_BASED_OUTPATIENT_CLINIC_OR_DEPARTMENT_OTHER): Payer: Medicare Other | Admitting: Hematology

## 2013-11-30 ENCOUNTER — Other Ambulatory Visit (HOSPITAL_BASED_OUTPATIENT_CLINIC_OR_DEPARTMENT_OTHER): Payer: Medicare Other

## 2013-11-30 ENCOUNTER — Encounter: Payer: Self-pay | Admitting: Hematology

## 2013-11-30 VITALS — BP 201/58 | HR 60 | Temp 97.9°F | Resp 17 | Ht 61.5 in | Wt 138.1 lb

## 2013-11-30 DIAGNOSIS — C9110 Chronic lymphocytic leukemia of B-cell type not having achieved remission: Secondary | ICD-10-CM

## 2013-11-30 DIAGNOSIS — D509 Iron deficiency anemia, unspecified: Secondary | ICD-10-CM

## 2013-11-30 DIAGNOSIS — Z23 Encounter for immunization: Secondary | ICD-10-CM | POA: Diagnosis not present

## 2013-11-30 LAB — COMPREHENSIVE METABOLIC PANEL (CC13)
ALT: 16 U/L (ref 0–55)
ANION GAP: 10 meq/L (ref 3–11)
AST: 27 U/L (ref 5–34)
Albumin: 3.8 g/dL (ref 3.5–5.0)
Alkaline Phosphatase: 48 U/L (ref 40–150)
BUN: 29.2 mg/dL — ABNORMAL HIGH (ref 7.0–26.0)
CALCIUM: 10.5 mg/dL — AB (ref 8.4–10.4)
CO2: 31 meq/L — AB (ref 22–29)
Chloride: 101 mEq/L (ref 98–109)
Creatinine: 1.2 mg/dL — ABNORMAL HIGH (ref 0.6–1.1)
Glucose: 91 mg/dl (ref 70–140)
POTASSIUM: 3.9 meq/L (ref 3.5–5.1)
Sodium: 142 mEq/L (ref 136–145)
Total Bilirubin: 0.55 mg/dL (ref 0.20–1.20)
Total Protein: 7.2 g/dL (ref 6.4–8.3)

## 2013-11-30 LAB — CBC WITH DIFFERENTIAL/PLATELET
BASO%: 0.8 % (ref 0.0–2.0)
BASOS ABS: 0.1 10*3/uL (ref 0.0–0.1)
EOS%: 1.3 % (ref 0.0–7.0)
Eosinophils Absolute: 0.1 10*3/uL (ref 0.0–0.5)
HEMATOCRIT: 34.1 % — AB (ref 34.8–46.6)
HEMOGLOBIN: 10.9 g/dL — AB (ref 11.6–15.9)
LYMPH#: 5.1 10*3/uL — AB (ref 0.9–3.3)
LYMPH%: 47.4 % (ref 14.0–49.7)
MCH: 30.3 pg (ref 25.1–34.0)
MCHC: 32 g/dL (ref 31.5–36.0)
MCV: 94.5 fL (ref 79.5–101.0)
MONO#: 0.6 10*3/uL (ref 0.1–0.9)
MONO%: 5.9 % (ref 0.0–14.0)
NEUT%: 44.6 % (ref 38.4–76.8)
NEUTROS ABS: 4.8 10*3/uL (ref 1.5–6.5)
Platelets: 191 10*3/uL (ref 145–400)
RBC: 3.61 10*6/uL — ABNORMAL LOW (ref 3.70–5.45)
RDW: 13.7 % (ref 11.2–14.5)
WBC: 10.7 10*3/uL — AB (ref 3.9–10.3)

## 2013-11-30 MED ORDER — PNEUMOCOCCAL 13-VAL CONJ VACC IM SUSP
0.5000 mL | Freq: Once | INTRAMUSCULAR | Status: AC
  Administered 2013-11-30: 0.5 mL via INTRAMUSCULAR
  Filled 2013-11-30: qty 0.5

## 2013-11-30 MED ORDER — PNEUMOCOCCAL VAC POLYVALENT 25 MCG/0.5ML IJ INJ
0.5000 mL | INJECTION | Freq: Once | INTRAMUSCULAR | Status: DC
  Filled 2013-11-30: qty 0.5

## 2013-11-30 NOTE — Progress Notes (Signed)
[facility] PROGRESS NOTE DATE OF VISIT: 11/30/2013  Cathlean Cower, MD [place] [address]  DIAGNOSIS: CLL (chronic lymphocytic leukemia) - Plan: CBC with Differential, Comprehensive metabolic panel (Cmet) - [facility], Lactate dehydrogenase (LDH) - [facility], pneumococcal 13-valent conjugate vaccine (PREVNAR 13) injection 0.5 mL, DISCONTINUED: pneumococcal 23 valent vaccine (PNU-IMMUNE) injection 0.5 mL  Chief Complaint  Patient presents with  . Follow-up    CURRENT THERAPY: Observation.    INTERVAL HISTORY:  Norma Smith 80 y.o. female with a history of CLL is here for a one-year follow-up.  Se was last seen by Dr. Juliann Mule on 12/01/2012.  She had a colonoscopy about 2-3 years ago.  She declines mammograms due to her advanced age.  Her last one was about 7-8 years ago.  She reports receiving the flu shot.  She denies any recent hospitalizations or emergency room visits.  She denies recurrent infections.   She is compliant to oral iron supplementation.  Her appetite and weight is stable. She did get a 23-prinivar vaccine in our office today. Her BP was up today and we discussed about not eating too much salty food and keeping a log book of her BP at home.  MEDICAL HISTORY: Past Medical History  Diagnosis Date  . Skin cancer   . Iron deficiency 12/01/2010  . Allergy   . Diabetes mellitus   . Hyperlipidemia   . Hypertension   . Anemia   . Osteopenia   . Dry skin dermatitis 12/01/2010  . CLL (chronic lymphoblastic leukemia) 12/01/2010  . Aortic stenosis, moderate 06/16/2011  . Blood transfusion without reported diagnosis 2010?  Marland Kitchen Stroke     1999    INTERIM HISTORY: has Preventative health care; Iron deficiency; Allergy; Colon polyps; Type II or unspecified type diabetes mellitus without mention of complication, uncontrolled; Hyperlipidemia; Hypertension; Anemia; Osteopenia; Dry skin dermatitis; CLL (chronic lymphocytic leukemia); Fatigue; Vertigo;  Aortic stenosis, moderate; Hypertrophic obstructive cardiomyopathy(425.11); Bruit; Claudication; Depression; Daytime hypersomnolence; Acute fulminating appendicitis with perforation and peritonitis; AKI (acute kidney injury); Anxiety; Peripheral edema; Post-menopausal bleeding; Esophageal reflux; and Constipation on her problem list.    ALLERGIES:  is allergic to oxycodone; roxanol; codeine; and penicillins.  MEDICATIONS: has a current medication list which includes the following prescription(s): acetaminophen, amlodipine, aspirin, atorvastatin, calcium-d, diazepam, iron, furosemide, irbesartan, metformin, metoprolol tartrate, multiple vitamins-minerals, and pioglitazone.  SURGICAL HISTORY:  Past Surgical History  Procedure Laterality Date  . Breast biopsy  1976  . Cataract extraction    . Laparoscopic appendectomy N/A 12/13/2012    Procedure: APPENDECTOMY LAPAROSCOPIC;  Surgeon: Odis Hollingshead, MD;  Location: [facility];  Service: General;  Laterality: N/A;    REVIEW OF SYSTEMS:   Constitutional: Denies fevers, chills or abnormal weight loss Eyes: Denies blurriness of vision Ears, nose, mouth, throat, and face: Denies mucositis or sore throat Respiratory: Denies cough, dyspnea or wheezes Cardiovascular: Denies palpitation, chest discomfort or lower extremity swelling Gastrointestinal:  Denies nausea, heartburn or change in bowel habits Skin: Denies abnormal skin rashes Lymphatics: Denies new lymphadenopathy or easy bruising Neurological:Denies numbness, tingling or new weaknesses Behavioral/Psych: Mood is stable, no new changes  All other systems were reviewed with the patient and are negative.  PHYSICAL EXAMINATION: ECOG PERFORMANCE STATUS: 0  Blood pressure 201/58, pulse 60, temperature 97.9 F (36.6 C), temperature source Oral, resp. rate 17, height 5' 1.5" (1.562 m), weight 138 lb 1.6 oz (62.642 kg), SpO2 97 %.  GENERAL:alert, no distress and comfortable; elderly female  who  appears her stated age.  SKIN: skin color, texture, turgor are normal, no rashes or significant lesions EYES: normal, Conjunctiva are pink and non-injected, sclera clear OROPHARYNX:no exudate, no erythema and lips, buccal mucosa, and tongue normal  NECK: supple, thyroid normal size, non-tender, without nodularity LYMPH:  no palpable lymphadenopathy in the cervical, axillary or supraclavicular LUNGS: clear to auscultation and percussion with normal breathing effort HEART: regular rate & rhythm and no murmurs and no lower extremity edema ABDOMEN:abdomen soft, non-tender and normal bowel sounds; no organomegaly.  Musculoskeletal:no cyanosis of digits and no clubbing  NEURO: alert & oriented x 3 with fluent speech, no focal motor/sensory deficits   LABORATORY DATA: Results for orders placed or performed in visit on 11/30/13 (from the past 48 hour(s))  CBC with Differential     Status: Abnormal   Collection Time: 11/30/13 12:31 PM  Result Value Ref Range   WBC 10.7 (H) 3.9 - 10.3 10e3/uL   NEUT# 4.8 1.5 - 6.5 10e3/uL   HGB 10.9 (L) 11.6 - 15.9 g/dL   HCT 34.1 (L) 34.8 - 46.6 %   Platelets 191 145 - 400 10e3/uL   MCV 94.5 79.5 - 101.0 fL   MCH 30.3 25.1 - 34.0 pg   MCHC 32.0 31.5 - 36.0 g/dL   RBC 3.61 (L) 3.70 - 5.45 10e6/uL   RDW 13.7 11.2 - 14.5 %   lymph# 5.1 (H) 0.9 - 3.3 10e3/uL   MONO# 0.6 0.1 - 0.9 10e3/uL   Eosinophils Absolute 0.1 0.0 - 0.5 10e3/uL   Basophils Absolute 0.1 0.0 - 0.1 10e3/uL   NEUT% 44.6 38.4 - 76.8 %   LYMPH% 47.4 14.0 - 49.7 %   MONO% 5.9 0.0 - 14.0 %   EOS% 1.3 0.0 - 7.0 %   BASO% 0.8 0.0 - 2.0 %  Comprehensive metabolic panel (Cmet) - [facility]     Status: Abnormal   Collection Time: 11/30/13 12:31 PM  Result Value Ref Range   Sodium 142 136 - 145 mEq/L   Potassium 3.9 3.5 - 5.1 mEq/L   Chloride 101 98 - 109 mEq/L   CO2 31 (H) 22 - 29 mEq/L   Glucose 91 70 - 140 mg/dl   BUN 29.2 (H) 7.0 - 26.0 mg/dL   Creatinine 1.2 (H) 0.6 - 1.1 mg/dL   Total  Bilirubin 0.55 0.20 - 1.20 mg/dL   Alkaline Phosphatase 48 40 - 150 U/L   AST 27 5 - 34 U/L   ALT 16 0 - 55 U/L   Total Protein 7.2 6.4 - 8.3 g/dL   Albumin 3.8 3.5 - 5.0 g/dL   Calcium 10.5 (H) 8.4 - 10.4 mg/dL   Anion Gap 10 3 - 11 mEq/L       Labs:  Lab Results  Component Value Date   WBC 10.7* 11/30/2013   HGB 10.9* 11/30/2013   HCT 34.1* 11/30/2013   MCV 94.5 11/30/2013   PLT 191 11/30/2013   NEUTROABS 4.8 11/30/2013      Chemistry      Component Value Date/Time   NA 142 11/30/2013 1231   NA 141 08/25/2013 1108   K 3.9 11/30/2013 1231   K 3.8 08/25/2013 1108   CL 103 08/25/2013 1108   CL 105 12/14/2011 0851   CO2 31* 11/30/2013 1231   CO2 28 08/25/2013 1108   BUN 29.2* 11/30/2013 1231   BUN 29* 08/25/2013 1108   CREATININE 1.2* 11/30/2013 1231   CREATININE 1.2 08/25/2013 1108  Component Value Date/Time   CALCIUM 10.5* 11/30/2013 1231   CALCIUM 9.4 08/25/2013 1108   ALKPHOS 48 11/30/2013 1231   ALKPHOS 44 08/25/2013 1108   AST 27 11/30/2013 1231   AST 24 08/25/2013 1108   ALT 16 11/30/2013 1231   ALT 12 08/25/2013 1108   BILITOT 0.55 11/30/2013 1231   BILITOT 0.6 08/25/2013 1108     Basic Metabolic Panel:  Recent Labs Lab 11/30/13 1231  NA 142  K 3.9  CO2 31*  GLUCOSE 91  BUN 29.2*  CREATININE 1.2*  CALCIUM 10.5*   GFR Estimated Creatinine Clearance: 33.2 mL/min (by C-G formula based on Cr of 1.2). Liver Function Tests:  Recent Labs Lab 11/30/13 1231  AST 27  ALT 16  ALKPHOS 48  BILITOT 0.55  PROT 7.2  ALBUMIN 3.8   CBC:  Recent Labs Lab 11/30/13 1231  WBC 10.7*  NEUTROABS 4.8  HGB 10.9*  HCT 34.1*  MCV 94.5  PLT 191   Anemia work up No results for input(s): VITAMINB12, FOLATE, FERRITIN, TIBC, IRON, RETICCTPCT in the last 72 hours. Studies:  No results found.   RADIOGRAPHIC STUDIES: No results found.  ASSESSMENT: Norma Smith 80 y.o. female with a history of CLL (chronic lymphocytic leukemia) - Plan: CBC with  Differential, Comprehensive metabolic panel (Cmet) - [facility], Lactate dehydrogenase (LDH) - [facility], pneumococcal 13-valent conjugate vaccine (PREVNAR 13) injection 0.5 mL, DISCONTINUED: pneumococcal 23 valent vaccine (PNU-IMMUNE) injection 0.5 mL   PLAN:  1. CLL -- Patient is clinically doing well.  Her white count remains stable.  We will continue to monitor for symptoms and a CLL. No indications to treat CLL now.   2. Iron-deficiency anemia. -- Iron studies are stable.  Ferritin is 24.  Counseled to continue iron supplementation.  Etiology likely GI given her age. Last colonoscopy as indicated above.  She denies the presence of anemia symptoms such as palpitations, acute shortness or breath.   3. Follow-up. -- We will recheck labs in one year.  They will include her CBC, CMP. Pneumonia vaccine given today.  All questions were answered. The patient knows to call the clinic with any problems, questions or concerns. We can certainly see the patient much sooner if necessary.  I spent 10 minutes counseling the patient face to face. The total time spent in the appointment was 15 minutes.    Bernadene Bell, MD Medical Hematologist/Oncologist [facility] Pager: [phone] Office No: [phone]

## 2013-11-30 NOTE — Telephone Encounter (Signed)
Pt confirmed labs/ov per 11/09 POF, gave pt AVS ..... KJ

## 2014-01-18 ENCOUNTER — Other Ambulatory Visit: Payer: Self-pay | Admitting: Internal Medicine

## 2014-01-19 NOTE — Telephone Encounter (Signed)
Faxed hardcopy for Valium to [facility]

## 2014-01-19 NOTE — Telephone Encounter (Signed)
Done hardcopy to robin  

## 2014-02-15 ENCOUNTER — Other Ambulatory Visit: Payer: Self-pay | Admitting: Internal Medicine

## 2014-02-25 ENCOUNTER — Ambulatory Visit (INDEPENDENT_AMBULATORY_CARE_PROVIDER_SITE_OTHER): Payer: Medicare Other | Admitting: Internal Medicine

## 2014-02-25 ENCOUNTER — Encounter: Payer: Self-pay | Admitting: Internal Medicine

## 2014-02-25 VITALS — BP 140/72 | HR 69 | Temp 97.8°F | Ht 62.0 in | Wt 138.5 lb

## 2014-02-25 DIAGNOSIS — I10 Essential (primary) hypertension: Secondary | ICD-10-CM

## 2014-02-25 DIAGNOSIS — F329 Major depressive disorder, single episode, unspecified: Secondary | ICD-10-CM

## 2014-02-25 DIAGNOSIS — E785 Hyperlipidemia, unspecified: Secondary | ICD-10-CM

## 2014-02-25 DIAGNOSIS — F32A Depression, unspecified: Secondary | ICD-10-CM

## 2014-02-25 DIAGNOSIS — E119 Type 2 diabetes mellitus without complications: Secondary | ICD-10-CM

## 2014-02-25 NOTE — Progress Notes (Signed)
Pre visit review using our clinic review tool, if applicable. No additional management support is needed unless otherwise documented below in the visit note. 

## 2014-02-25 NOTE — Progress Notes (Signed)
Subjective:    Patient ID: Norma Smith, female    DOB: [birth year], 81 y.o.   MRN: [id number]  HPI Here for yearly f/u;  Overall doing ok;  Pt denies CP, worsening SOB, DOE, wheezing, orthopnea, PND, worsening LE edema, palpitations, dizziness or syncope.  Pt denies neurological change such as new headache, facial or extremity weakness.  Pt denies polydipsia, polyuria, or low sugar symptoms. Pt states overall good compliance with treatment and medications, good tolerability, and has been trying to follow lower cholesterol diet.  Pt denies worsening depressive symptoms, suicidal ideation or panic. No fever, night sweats, wt loss, loss of appetite, or other constitutional symptoms.  Pt states good ability with ADL's, has low fall risk, home safety reviewed and adequate, no other significant changes in hearing or vision, and only occasionally active with exercise.  Mentions some increased bruising o arms and hands, also c/o occas "deep" pain to legs/arms and shoulder.  Had labs in Nov, pt declines other lab today Past Medical History  Diagnosis Date  . Skin cancer   . Iron deficiency 12/01/2010  . Allergy   . Diabetes mellitus   . Hyperlipidemia   . Hypertension   . Anemia   . Osteopenia   . Dry skin dermatitis 12/01/2010  . CLL (chronic lymphoblastic leukemia) 12/01/2010  . Aortic stenosis, moderate 06/16/2011  . Blood transfusion without reported diagnosis 2010?  Marland Kitchen Stroke     1999   Past Surgical History  Procedure Laterality Date  . Breast biopsy  1976  . Cataract extraction    . Laparoscopic appendectomy N/A 12/13/2012    Procedure: APPENDECTOMY LAPAROSCOPIC;  Surgeon: Odis Hollingshead, MD;  Location: [facility];  Service: General;  Laterality: N/A;    reports that she has quit smoking. She does not have any smokeless tobacco history on file. She reports that she does not drink alcohol or use illicit drugs. family history includes Cancer in her maternal aunt and other; Diabetes in her  mother, other, and other; Heart disease in her maternal aunt and other. Allergies  Allergen Reactions  . Oxycodone Other (See Comments)    Visual Hallucinations  . Roxanol [Morphine] Hives and Rash  . Codeine Other (See Comments)    Passed out  . Penicillins Rash   Current Outpatient Prescriptions on File Prior to Visit  Medication Sig Dispense Refill  . acetaminophen (TYLENOL) 325 MG tablet Take 650 mg by mouth as needed for mild pain.     Marland Kitchen amLODipine (NORVASC) 5 MG tablet Take 1 tablet (5 mg total) by mouth daily. 90 tablet 3  . aspirin 81 MG tablet Take 81 mg by mouth daily.      Marland Kitchen atorvastatin (LIPITOR) 20 MG tablet Take 1 tablet (20 mg total) by mouth daily. 90 tablet 3  . Calcium Carbonate-Vitamin D (CALCIUM-D) 600-400 MG-UNIT TABS Take by mouth 2 (two) times daily.      . diazepam (VALIUM) 5 MG tablet TAKE 1/2 TABLET BY MOUTH DAILY 15 tablet 2  . Ferrous Sulfate (IRON) 325 (65 FE) MG TABS Take by mouth 2 (two) times daily.      . furosemide (LASIX) 40 MG tablet Take 1 tablet (40 mg total) by mouth daily. 90 tablet 3  . irbesartan (AVAPRO) 300 MG tablet Take 1 tablet (300 mg total) by mouth at bedtime. 90 tablet 3  . metFORMIN (GLUCOPHAGE) 1000 MG tablet Take 1 tablet (1,000 mg total) by mouth daily. 90 tablet 3  . metoprolol tartrate (LOPRESSOR) 25  MG tablet Take 0.5 tablets (12.5 mg total) by mouth every morning. 90 tablet 3  . metoprolol tartrate (LOPRESSOR) 25 MG tablet TAKE 1/2 TABLET BY MOUTH EVERY MORNING 90 tablet 2  . Multiple Vitamins-Minerals (CENTRUM SILVER PO) Take by mouth daily.      . pioglitazone (ACTOS) 15 MG tablet Take 1 tablet (15 mg total) by mouth daily. 90 tablet 3   No current facility-administered medications on file prior to visit.   Review of Systems Constitutional: Negative for increased diaphoresis, other activity, appetite or other siginficant weight change  HENT: Negative for worsening hearing loss, ear pain, facial swelling, mouth sores and neck  stiffness.   Eyes: Negative for other worsening pain, redness or visual disturbance.  Respiratory: Negative for shortness of breath and wheezing.   Cardiovascular: Negative for chest pain and palpitations.  Gastrointestinal: Negative for diarrhea, blood in stool, abdominal distention or other pain Genitourinary: Negative for hematuria, flank pain or change in urine volume.  Musculoskeletal: Negative for myalgias or other joint complaints.  Skin: Negative for color change and wound.  Neurological: Negative for syncope and numbness. other than noted Hematological: Negative for adenopathy. or other swelling Psychiatric/Behavioral: Negative for hallucinations, self-injury, decreased concentration or other worsening agitation.      Objective:   Physical Exam BP 140/72 mmHg  Pulse 69  Temp(Src) 97.8 F (36.6 C) (Oral)  Ht 5\' 2"  (1.575 m)  Wt 138 lb 8 oz (62.823 kg)  BMI 25.33 kg/m2  SpO2 96% VS noted,  Constitutional: Pt is oriented to person, place, and time. Appears well-developed and well-nourished.  Head: Normocephalic and atraumatic.  Right Ear: External ear normal.  Left Ear: External ear normal.  Nose: Nose normal.  Mouth/Throat: Oropharynx is clear and moist.  Eyes: Conjunctivae and EOM are normal. Pupils are equal, round, and reactive to light.  Neck: Normal range of motion. Neck supple. No JVD present. No tracheal deviation present.  Cardiovascular: Normal rate, regular rhythm, normal heart sounds and intact distal pulses.  , gr 2/6 murmur no change RUSB Pulmonary/Chest: Effort normal and breath sounds without rales or wheezing  Abdominal: Soft. Bowel sounds are normal. NT. No HSM  Musculoskeletal: Normal range of motion. Exhibits no edema.  Lymphadenopathy:  Has no cervical adenopathy.  Neurological: Pt is alert and oriented to person, place, and time. Pt has normal reflexes. No cranial nerve deficit. Motor grossly intact Skin: Skin is warm and dry. No rash noted.    Psychiatric:  Has normal mood and affect. Behavior is normal. Not depressed affect    Assessment & Plan:

## 2014-02-25 NOTE — Patient Instructions (Signed)
Please continue all other medications as before, and refills have been done if requested.  Please have the pharmacy call with any other refills you may need.  Please continue your efforts at being more active, low cholesterol diet, and weight control.  You are otherwise up to date with prevention measures today.  Please keep your appointments with your specialists as you may have planned  Please return in 6 months, or sooner if needed 

## 2014-02-25 NOTE — Assessment & Plan Note (Signed)
stable overall by history and exam, recent data reviewed with pt, and pt to continue medical treatment as before,  to f/u any worsening symptoms or concerns BP Readings from Last 3 Encounters:  02/25/14 140/72  11/30/13 201/58  08/25/13 124/78

## 2014-02-25 NOTE — Assessment & Plan Note (Signed)
stable overall by history and exam, recent data reviewed with pt, and pt to continue medical treatment as before,  to f/u any worsening symptoms or concerns Lab Results  Component Value Date   WBC 10.7* 11/30/2013   HGB 10.9* 11/30/2013   HCT 34.1* 11/30/2013   PLT 191 11/30/2013   GLUCOSE 91 11/30/2013   CHOL 174 08/25/2013   TRIG 80.0 08/25/2013   HDL 56.30 08/25/2013   LDLCALC 102* 08/25/2013   ALT 16 11/30/2013   AST 27 11/30/2013   NA 142 11/30/2013   K 3.9 11/30/2013   CL 103 08/25/2013   CREATININE 1.2* 11/30/2013   BUN 29.2* 11/30/2013   CO2 31* 11/30/2013   TSH 4.54* 08/25/2013   HGBA1C 5.7 08/25/2013   MICROALBUR 78.0* 08/25/2013

## 2014-02-25 NOTE — Assessment & Plan Note (Signed)
stable overall by history and exam, recent data reviewed with pt, and pt to continue medical treatment as before,  to f/u any worsening symptoms or concerns. Lab Results  Component Value Date   HGBA1C 5.7 08/25/2013

## 2014-02-25 NOTE — Assessment & Plan Note (Signed)
stable overall by history and exam, recent data reviewed with pt, and pt to continue medical treatment as before,  to f/u any worsening symptoms or concerns Lab Results  Component Value Date   LDLCALC 102* 08/25/2013

## 2014-02-26 ENCOUNTER — Telehealth: Payer: Self-pay | Admitting: Internal Medicine

## 2014-02-26 NOTE — Telephone Encounter (Signed)
emmi emailed °

## 2014-05-05 ENCOUNTER — Other Ambulatory Visit: Payer: Self-pay | Admitting: Internal Medicine

## 2014-05-05 NOTE — Telephone Encounter (Signed)
Done hardcopy to Cherina  

## 2014-05-05 NOTE — Telephone Encounter (Signed)
Rx was sent to pharmacy. 

## 2014-07-28 DIAGNOSIS — E119 Type 2 diabetes mellitus without complications: Secondary | ICD-10-CM | POA: Diagnosis not present

## 2014-07-28 DIAGNOSIS — H3531 Nonexudative age-related macular degeneration: Secondary | ICD-10-CM | POA: Diagnosis not present

## 2014-08-17 ENCOUNTER — Other Ambulatory Visit: Payer: Self-pay | Admitting: Internal Medicine

## 2014-08-17 NOTE — Telephone Encounter (Signed)
Done hardcopy to Dahlia  

## 2014-08-17 NOTE — Telephone Encounter (Signed)
Please advise, thanks.

## 2014-08-26 ENCOUNTER — Ambulatory Visit (INDEPENDENT_AMBULATORY_CARE_PROVIDER_SITE_OTHER): Payer: Medicare Other | Admitting: Internal Medicine

## 2014-08-26 ENCOUNTER — Other Ambulatory Visit: Payer: Self-pay | Admitting: Internal Medicine

## 2014-08-26 ENCOUNTER — Other Ambulatory Visit (INDEPENDENT_AMBULATORY_CARE_PROVIDER_SITE_OTHER): Payer: Medicare Other

## 2014-08-26 ENCOUNTER — Encounter: Payer: Self-pay | Admitting: Internal Medicine

## 2014-08-26 VITALS — BP 182/70 | HR 72 | Temp 97.8°F | Ht 62.0 in | Wt 144.0 lb

## 2014-08-26 DIAGNOSIS — E119 Type 2 diabetes mellitus without complications: Secondary | ICD-10-CM | POA: Diagnosis not present

## 2014-08-26 DIAGNOSIS — I10 Essential (primary) hypertension: Secondary | ICD-10-CM

## 2014-08-26 DIAGNOSIS — E785 Hyperlipidemia, unspecified: Secondary | ICD-10-CM

## 2014-08-26 DIAGNOSIS — R3129 Other microscopic hematuria: Secondary | ICD-10-CM

## 2014-08-26 LAB — HEPATIC FUNCTION PANEL
ALT: 13 U/L (ref 0–35)
AST: 24 U/L (ref 0–37)
Albumin: 4.2 g/dL (ref 3.5–5.2)
Alkaline Phosphatase: 57 U/L (ref 39–117)
Bilirubin, Direct: 0.1 mg/dL (ref 0.0–0.3)
Total Bilirubin: 0.5 mg/dL (ref 0.2–1.2)
Total Protein: 7.4 g/dL (ref 6.0–8.3)

## 2014-08-26 LAB — URINALYSIS, ROUTINE W REFLEX MICROSCOPIC
Bilirubin Urine: NEGATIVE
Ketones, ur: NEGATIVE
Leukocytes, UA: NEGATIVE
Nitrite: NEGATIVE
Specific Gravity, Urine: 1.01 (ref 1.000–1.030)
Total Protein, Urine: 30 — AB
UROBILINOGEN UA: 0.2 (ref 0.0–1.0)
Urine Glucose: NEGATIVE
pH: 6 (ref 5.0–8.0)

## 2014-08-26 LAB — MICROALBUMIN / CREATININE URINE RATIO
Creatinine,U: 79.9 mg/dL
Microalb Creat Ratio: 49.3 mg/g — ABNORMAL HIGH (ref 0.0–30.0)
Microalb, Ur: 39.4 mg/dL — ABNORMAL HIGH (ref 0.0–1.9)

## 2014-08-26 LAB — CBC WITH DIFFERENTIAL/PLATELET
Basophils Absolute: 0.1 10*3/uL (ref 0.0–0.1)
Basophils Relative: 0.6 % (ref 0.0–3.0)
EOS ABS: 0.1 10*3/uL (ref 0.0–0.7)
EOS PCT: 1.5 % (ref 0.0–5.0)
HCT: 34.6 % — ABNORMAL LOW (ref 36.0–46.0)
Hemoglobin: 11.3 g/dL — ABNORMAL LOW (ref 12.0–15.0)
LYMPHS ABS: 3.8 10*3/uL (ref 0.7–4.0)
LYMPHS PCT: 45.2 % (ref 12.0–46.0)
MCHC: 32.7 g/dL (ref 30.0–36.0)
MCV: 94.1 fl (ref 78.0–100.0)
MONOS PCT: 6.4 % (ref 3.0–12.0)
Monocytes Absolute: 0.5 10*3/uL (ref 0.1–1.0)
NEUTROS ABS: 3.9 10*3/uL (ref 1.4–7.7)
Neutrophils Relative %: 46.3 % (ref 43.0–77.0)
Platelets: 157 10*3/uL (ref 150.0–400.0)
RBC: 3.68 Mil/uL — AB (ref 3.87–5.11)
RDW: 14.4 % (ref 11.5–15.5)
WBC: 8.5 10*3/uL (ref 4.0–10.5)

## 2014-08-26 LAB — LIPID PANEL
CHOL/HDL RATIO: 3
CHOLESTEROL: 179 mg/dL (ref 0–200)
HDL: 61.7 mg/dL (ref >39.00)
LDL Cholesterol: 100 mg/dL — ABNORMAL HIGH (ref 0–99)
NONHDL: 117.17
Triglycerides: 84 mg/dL (ref 0.0–149.0)
VLDL: 16.8 mg/dL (ref 0.0–40.0)

## 2014-08-26 LAB — BASIC METABOLIC PANEL
BUN: 36 mg/dL — AB (ref 6–23)
CO2: 30 meq/L (ref 19–32)
CREATININE: 1.36 mg/dL — AB (ref 0.40–1.20)
Calcium: 9.9 mg/dL (ref 8.4–10.5)
Chloride: 104 mEq/L (ref 96–112)
GFR: 39.87 mL/min — ABNORMAL LOW (ref >60.00)
GLUCOSE: 111 mg/dL — AB (ref 70–99)
Potassium: 4.2 mEq/L (ref 3.5–5.1)
SODIUM: 141 meq/L (ref 135–145)

## 2014-08-26 LAB — HEMOGLOBIN A1C: HEMOGLOBIN A1C: 5.4 % (ref 4.6–6.5)

## 2014-08-26 LAB — TSH: TSH: 3.7 u[IU]/mL (ref 0.35–4.50)

## 2014-08-26 MED ORDER — PIOGLITAZONE HCL 15 MG PO TABS: 15.0000 mg | ORAL_TABLET | Freq: Every day | ORAL | Status: DC

## 2014-08-26 MED ORDER — ATORVASTATIN CALCIUM 20 MG PO TABS: 20.0000 mg | ORAL_TABLET | Freq: Every day | ORAL | Status: DC

## 2014-08-26 MED ORDER — METOPROLOL TARTRATE 25 MG PO TABS: 12.5000 mg | ORAL_TABLET | Freq: Every morning | ORAL | Status: DC

## 2014-08-26 MED ORDER — FUROSEMIDE 40 MG PO TABS: 40.0000 mg | ORAL_TABLET | Freq: Every day | ORAL | Status: DC

## 2014-08-26 MED ORDER — IRBESARTAN 300 MG PO TABS: 300.0000 mg | ORAL_TABLET | Freq: Every day | ORAL | Status: DC

## 2014-08-26 MED ORDER — METFORMIN HCL 1000 MG PO TABS: 1000.0000 mg | ORAL_TABLET | Freq: Every day | ORAL | Status: DC

## 2014-08-26 MED ORDER — AMLODIPINE BESYLATE 5 MG PO TABS: 5.0000 mg | ORAL_TABLET | Freq: Every day | ORAL | Status: DC

## 2014-08-26 NOTE — Patient Instructions (Signed)

## 2014-08-26 NOTE — Progress Notes (Signed)
Subjective:    Patient ID: Norma Smith, female    DOB: 01-05-1934, 81 y.o.   MRN: [id number]  HPI  Here for yearly f/u;  Overall doing ok;  Pt denies Chest pain, worsening SOB, DOE, wheezing, orthopnea, PND, worsening LE edema, palpitations, dizziness or syncope.  Pt denies neurological change such as new headache, facial or extremity weakness.  Pt denies polydipsia, polyuria, or low sugar symptoms. Pt states overall good compliance with treatment and medications, good tolerability, and has been trying to follow appropriate diet.  Pt denies worsening depressive symptoms, suicidal ideation or panic. No fever, night sweats, wt loss, loss of appetite, or other constitutional symptoms.  Pt states good ability with ADL's, has low fall risk, home safety reviewed and adequate, no other significant changes in hearing or vision, and  occasionally active with exercise. Sees Heme regularly for CLL Past Medical History  Diagnosis Date  . Skin cancer   . Iron deficiency 12/01/2010  . Allergy   . Diabetes mellitus   . Hyperlipidemia   . Hypertension   . Anemia   . Osteopenia   . Dry skin dermatitis 12/01/2010  . CLL (chronic lymphoblastic leukemia) 12/01/2010  . Aortic stenosis, moderate 06/16/2011  . Blood transfusion without reported diagnosis 2010?  Marland Kitchen Stroke     1999   Past Surgical History  Procedure Laterality Date  . Breast biopsy  1976  . Cataract extraction    . Laparoscopic appendectomy N/A 12/13/2012    Procedure: APPENDECTOMY LAPAROSCOPIC;  Surgeon: Odis Hollingshead, MD;  Location: [facility];  Service: General;  Laterality: N/A;    reports that she has quit smoking. She does not have any smokeless tobacco history on file. She reports that she does not drink alcohol or use illicit drugs. family history includes Cancer in her maternal aunt and other; Diabetes in her mother, other, and other; Heart disease in her maternal aunt and other. Allergies  Allergen Reactions  . Oxycodone Other (See  Comments)    Visual Hallucinations  . Roxanol [Morphine] Hives and Rash  . Codeine Other (See Comments)    Passed out  . Penicillins Rash   Current Outpatient Prescriptions on File Prior to Visit  Medication Sig Dispense Refill  . acetaminophen (TYLENOL) 325 MG tablet Take 650 mg by mouth as needed for mild pain.     Marland Kitchen aspirin 81 MG tablet Take 81 mg by mouth daily.      . Calcium Carbonate-Vitamin D (CALCIUM-D) 600-400 MG-UNIT TABS Take by mouth 2 (two) times daily.      . diazepam (VALIUM) 5 MG tablet TAKE 1/2 TABLET BY MOUTH DAILY 15 tablet 2  . Ferrous Sulfate (IRON) 325 (65 FE) MG TABS Take by mouth 2 (two) times daily.      . Multiple Vitamins-Minerals (CENTRUM SILVER PO) Take by mouth daily.       No current facility-administered medications on file prior to visit.   Review of Systems Constitutional: Negative for increased diaphoresis, other activity, appetite or siginficant weight change other than noted HENT: Negative for worsening hearing loss, ear pain, facial swelling, mouth sores and neck stiffness.   Eyes: Negative for other worsening pain, redness or visual disturbance.  Respiratory: Negative for shortness of breath and wheezing  Cardiovascular: Negative for chest pain and palpitations.  Gastrointestinal: Negative for diarrhea, blood in stool, abdominal distention or other pain Genitourinary: Negative for hematuria, flank pain or change in urine volume.  Musculoskeletal: Negative for myalgias or other joint complaints.  Skin: Negative for color change and wound or drainage.  Neurological: Negative for syncope and numbness. other than noted Hematological: Negative for adenopathy. or other swelling Psychiatric/Behavioral: Negative for hallucinations, SI, self-injury, decreased concentration or other worsening agitation.      Objective:   Physical Exam BP 182/70 mmHg  Pulse 72  Temp(Src) 97.8 F (36.6 C) (Oral)  Ht 5\' 2"  (1.575 m)  Wt 144 lb (65.318 kg)  BMI 26.33  kg/m2  SpO2 96% VS noted,  Constitutional: Pt is oriented to person, place, and time. Appears well-developed and well-nourished, in no significant distress Head: Normocephalic and atraumatic.  Right Ear: External ear normal.  Left Ear: External ear normal.  Nose: Nose normal.  Mouth/Throat: Oropharynx is clear and moist.  Eyes: Conjunctivae and EOM are normal. Pupils are equal, round, and reactive to light.  Neck: Normal range of motion. Neck supple. No JVD present. No tracheal deviation present or significant neck LA or mass Cardiovascular: Normal rate, regular rhythm, normal heart sounds and intact distal pulses.   Pulmonary/Chest: Effort normal and breath sounds without rales or wheezing  Abdominal: Soft. Bowel sounds are normal. NT. No HSM  Musculoskeletal: Normal range of motion. Exhibits no edema.  Lymphadenopathy:  Has no cervical adenopathy.  Neurological: Pt is alert and oriented to person, place, and time. Pt has normal reflexes. No cranial nerve deficit. Motor grossly intact Skin: Skin is warm and dry. No rash noted.  Psychiatric:  Has normal mood and affect. Behavior is normal.      Assessment & Plan:

## 2014-08-26 NOTE — Assessment & Plan Note (Signed)
stable overall by history and exam, recent data reviewed with pt, and pt to continue medical treatment as before,  to f/u any worsening symptoms or concerns Lab Results  Component Value Date   HGBA1C 5.7 08/25/2013

## 2014-08-26 NOTE — Assessment & Plan Note (Signed)
stable overall by history and exam, recent data reviewed with pt, and pt to continue medical treatment as before,  to f/u any worsening symptoms or concerns Lab Results  Component Value Date   LDLCALC 102* 08/25/2013

## 2014-08-26 NOTE — Progress Notes (Signed)
Pre visit review using our clinic review tool, if applicable. No additional management support is needed unless otherwise documented below in the visit note. 

## 2014-08-26 NOTE — Assessment & Plan Note (Signed)
stable overall by history and exam, recent data reviewed with pt, and pt to continue medical treatment as before,  to f/u any worsening symptoms or concerns BP Readings from Last 3 Encounters:  08/26/14 182/70  02/25/14 140/72  11/30/13 201/58   For f/u labs

## 2014-08-30 ENCOUNTER — Encounter: Payer: Self-pay | Admitting: Internal Medicine

## 2014-10-05 ENCOUNTER — Telehealth: Payer: Self-pay | Admitting: Hematology and Oncology

## 2014-10-05 NOTE — Telephone Encounter (Signed)
Moved 11/9 f/u from CP1 to NG 11/10 @ 12:30 pm - lab/NG. Schedule mailed.

## 2014-10-15 DIAGNOSIS — Z23 Encounter for immunization: Secondary | ICD-10-CM | POA: Diagnosis not present

## 2014-12-01 ENCOUNTER — Ambulatory Visit: Payer: Medicare Other

## 2014-12-01 ENCOUNTER — Other Ambulatory Visit: Payer: Medicare Other

## 2014-12-02 ENCOUNTER — Other Ambulatory Visit (HOSPITAL_BASED_OUTPATIENT_CLINIC_OR_DEPARTMENT_OTHER): Payer: Medicare Other

## 2014-12-02 ENCOUNTER — Encounter: Payer: Self-pay | Admitting: Hematology and Oncology

## 2014-12-02 ENCOUNTER — Other Ambulatory Visit: Payer: Self-pay | Admitting: Hematology and Oncology

## 2014-12-02 ENCOUNTER — Ambulatory Visit (HOSPITAL_BASED_OUTPATIENT_CLINIC_OR_DEPARTMENT_OTHER): Payer: Medicare Other | Admitting: Hematology and Oncology

## 2014-12-02 VITALS — BP 132/99 | HR 59 | Temp 98.1°F | Resp 18 | Ht 62.0 in | Wt 148.2 lb

## 2014-12-02 DIAGNOSIS — D72820 Lymphocytosis (symptomatic): Secondary | ICD-10-CM | POA: Insufficient documentation

## 2014-12-02 DIAGNOSIS — D631 Anemia in chronic kidney disease: Secondary | ICD-10-CM | POA: Diagnosis not present

## 2014-12-02 DIAGNOSIS — R3121 Asymptomatic microscopic hematuria: Secondary | ICD-10-CM

## 2014-12-02 DIAGNOSIS — N183 Chronic kidney disease, stage 3 (moderate): Secondary | ICD-10-CM

## 2014-12-02 DIAGNOSIS — I421 Obstructive hypertrophic cardiomyopathy: Secondary | ICD-10-CM | POA: Diagnosis not present

## 2014-12-02 DIAGNOSIS — N189 Chronic kidney disease, unspecified: Secondary | ICD-10-CM

## 2014-12-02 HISTORY — DX: Lymphocytosis (symptomatic): D72.820

## 2014-12-02 LAB — CBC WITH DIFFERENTIAL/PLATELET
BASO%: 0.7 % (ref 0.0–2.0)
BASOS ABS: 0.1 10*3/uL (ref 0.0–0.1)
EOS ABS: 0.2 10*3/uL (ref 0.0–0.5)
EOS%: 1.9 % (ref 0.0–7.0)
HEMATOCRIT: 33 % — AB (ref 34.8–46.6)
HGB: 10.7 g/dL — ABNORMAL LOW (ref 11.6–15.9)
LYMPH#: 3.3 10*3/uL (ref 0.9–3.3)
LYMPH%: 37.6 % (ref 14.0–49.7)
MCH: 30.2 pg (ref 25.1–34.0)
MCHC: 32.3 g/dL (ref 31.5–36.0)
MCV: 93.5 fL (ref 79.5–101.0)
MONO#: 0.6 10*3/uL (ref 0.1–0.9)
MONO%: 7.1 % (ref 0.0–14.0)
NEUT#: 4.6 10*3/uL (ref 1.5–6.5)
NEUT%: 52.7 % (ref 38.4–76.8)
Platelets: 194 10*3/uL (ref 145–400)
RBC: 3.54 10*6/uL — ABNORMAL LOW (ref 3.70–5.45)
RDW: 13.5 % (ref 11.2–14.5)
WBC: 8.7 10*3/uL (ref 3.9–10.3)

## 2014-12-02 NOTE — Assessment & Plan Note (Signed)
The patient has fluctuation of her white count, ranging from absolute lymphocyte count of 3.3 to over 6. 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 I told her that would still constitute a form of malignancy. I educated her the importance of vaccination programs to prevent risks of infection and also warned her about risks of cancer including skin cancer. I recommend she continues and/or follow-up repeat CBC, history and physical examination.

## 2014-12-02 NOTE — Assessment & Plan Note (Signed)
This is likely anemia of chronic disease. The patient denies recent history of bleeding such as epistaxis or hematochezia. She is asymptomatic from the anemia. We will observe for now.  I explained to her that iron deficiency anemia might not be the cause. I do not see any recent iron studies. She is comfortable continue on oral iron supplement for now

## 2014-12-02 NOTE — Progress Notes (Signed)
[place] progress notes  Patient Care Team: Biagio Borg, MD as PCP - General (Internal Medicine) Heath Lark, MD as Consulting Physician (Hematology and Oncology)  CHIEF COMPLAINTS/PURPOSE OF VISIT:  CLL/MBUS  HISTORY OF PRESENTING ILLNESS:  Norma Smith 81 y.o. female was transferred to my care after her prior physician has left.  I reviewed the patient's records extensive and collaborated the history with the patient. Summary of her history is as follows: This patient was noted to have chronic leukocytosis since 2012. Flow cytometry performed in November 2012 showed an abnormal B-cell population expressing pan B-cell antigens including CD20 and CD23, associated with coexpression of CD5 and CD20. This B-cell population shows extremely dim/negative staining for surface immunoglobulin light chains that hinder assessment and/or quantitation of clonality. Nonetheless, the features are worrisome for early involvement by chronic lymphocytic leukemia. She was being observed. Since then, the absolute lymphocyte count fluctuated between 3.3 to 6. She denies recurrent infection. No new lymphadenopathy. She denies any chest pain or shortness of breath. She has a history of aortic stenosis and echocardiogram features of hypertrophic obstructive cardiomyopathy but does not follow with cardiologist regularly. She was noted to have microscopic hematuria detected as part of her routine follow-up with her primary physician and was recommended to see urologist but the patient did not keep her appointment.  MEDICAL HISTORY:  Past Medical History  Diagnosis Date  . Skin cancer   . Iron deficiency 12/01/2010  . Allergy   . Diabetes mellitus   . Hyperlipidemia   . Hypertension   . Anemia   . Osteopenia   . Dry skin dermatitis 12/01/2010  . CLL (chronic lymphoblastic leukemia) 12/01/2010  . Aortic stenosis, moderate 06/16/2011  . Blood transfusion without reported diagnosis  2010?  Marland Kitchen Stroke ([place])     1999  . Monoclonal B-cell lymphocytosis of unknown significance 12/02/2014    SURGICAL HISTORY: Past Surgical History  Procedure Laterality Date  . Breast biopsy  1976  . Cataract extraction    . Laparoscopic appendectomy N/A 12/13/2012    Procedure: APPENDECTOMY LAPAROSCOPIC;  Surgeon: Odis Hollingshead, MD;  Location: [facility];  Service: General;  Laterality: N/A;    SOCIAL HISTORY: Social History   Social History  . Marital Status: Divorced    Spouse Name: N/A  . Number of Children: 3  . Years of Education: 16   Occupational History  . Retired    Social History Main Topics  . Smoking status: Former Research scientist (life sciences)  . Smokeless tobacco: Never Used     Comment: Stopped smoking 5 yrs. ago.  . Alcohol Use: No     Comment: Occasional  . Drug Use: No  . Sexual Activity: Not on file     Comment: divorced, 3 daughters, retired administration   Other Topics Concern  . Not on file   Social History Narrative    FAMILY HISTORY: Family History  Problem Relation Age of Onset  . Diabetes Other   . Cancer Other     breast cancer  . Heart disease Other   . Diabetes Other   . Diabetes Mother   . Cancer Maternal Aunt     breast  . Heart disease Maternal Aunt     ALLERGIES:  is allergic to oxycodone; roxanol; codeine; and penicillins.  MEDICATIONS:  Current Outpatient Prescriptions  Medication Sig Dispense Refill  . acetaminophen (TYLENOL) 325 MG tablet Take 650 mg by mouth as needed for mild pain.     Marland Kitchen amLODipine (  NORVASC) 5 MG tablet Take 1 tablet (5 mg total) by mouth daily. 90 tablet 3  . aspirin 81 MG tablet Take 81 mg by mouth daily.      Marland Kitchen atorvastatin (LIPITOR) 20 MG tablet Take 1 tablet (20 mg total) by mouth daily. 90 tablet 3  . Calcium Carbonate-Vitamin D (CALCIUM-D) 600-400 MG-UNIT TABS Take by mouth 2 (two) times daily.      . diazepam (VALIUM) 5 MG tablet TAKE 1/2 TABLET BY MOUTH DAILY 15 tablet 2  . Ferrous Sulfate (IRON) 325 (65 FE)  MG TABS Take by mouth 2 (two) times daily.      . furosemide (LASIX) 40 MG tablet Take 1 tablet (40 mg total) by mouth daily. 90 tablet 3  . irbesartan (AVAPRO) 300 MG tablet Take 1 tablet (300 mg total) by mouth at bedtime. 90 tablet 3  . metFORMIN (GLUCOPHAGE) 1000 MG tablet Take 1 tablet (1,000 mg total) by mouth daily. 90 tablet 3  . metoprolol tartrate (LOPRESSOR) 25 MG tablet Take 0.5 tablets (12.5 mg total) by mouth every morning. 90 tablet 2  . Multiple Vitamins-Minerals (CENTRUM SILVER PO) Take by mouth daily.      . pioglitazone (ACTOS) 15 MG tablet Take 1 tablet (15 mg total) by mouth daily. 90 tablet 3   No current facility-administered medications for this visit.    REVIEW OF SYSTEMS:   Constitutional: Denies fevers, chills or abnormal night sweats Eyes: Denies blurriness of vision, double vision or watery eyes Ears, nose, mouth, throat, and face: Denies mucositis or sore throat Respiratory: Denies cough, dyspnea or wheezes Cardiovascular: Denies palpitation, chest discomfort or lower extremity swelling Gastrointestinal:  Denies nausea, heartburn or change in bowel habits Skin: Denies abnormal skin rashes Lymphatics: Denies new lymphadenopathy or easy bruising Neurological:Denies numbness, tingling or new weaknesses Behavioral/Psych: Mood is stable, no new changes  All other systems were reviewed with the patient and are negative.  PHYSICAL EXAMINATION: ECOG PERFORMANCE STATUS: 0 - Asymptomatic  Filed Vitals:   12/02/14 1231  BP: 132/99  Pulse: 59  Temp: 98.1 F (36.7 C)  Resp: 18   Filed Weights   12/02/14 1231  Weight: 148 lb 3.2 oz (67.223 kg)    GENERAL:alert, no distress and comfortable SKIN: skin color, texture, turgor are normal, no rashes or significant lesions EYES: normal, conjunctiva are pink and non-injected, sclera clear OROPHARYNX:no exudate, normal lips, buccal mucosa, and tongue  NECK: supple, thyroid normal size, non-tender, without  nodularity LYMPH:  no palpable lymphadenopathy in the cervical, axillary or inguinal LUNGS: clear to auscultation and percussion with normal breathing effort HEART: regular rate & rhythm with left systolic murmurs without lower extremity edema ABDOMEN:abdomen soft, non-tender and normal bowel sounds Musculoskeletal:no cyanosis of digits and no clubbing  PSYCH: alert & oriented x 3 with fluent speech NEURO: no focal motor/sensory deficits  LABORATORY DATA:  I have reviewed the data as listed Lab Results  Component Value Date   WBC 8.7 12/02/2014   HGB 10.7* 12/02/2014   HCT 33.0* 12/02/2014   MCV 93.5 12/02/2014   PLT 194 12/02/2014    Recent Labs  08/26/14 1112  NA 141  K 4.2  CL 104  CO2 30  GLUCOSE 111*  BUN 36*  CREATININE 1.36*  CALCIUM 9.9  PROT 7.4  ALBUMIN 4.2  AST 24  ALT 13  ALKPHOS 57  BILITOT 0.5  BILIDIR 0.1    ASSESSMENT & PLAN:  Monoclonal B-cell lymphocytosis of unknown significance The patient has fluctuation of  her white count, ranging from absolute lymphocyte count of 3.3 to over 6. 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 I told her that would still constitute a form of malignancy. I educated her the importance of vaccination programs to prevent risks of infection and also warned her about risks of cancer including skin cancer. I recommend she continues and/or follow-up repeat CBC, history and physical examination.  Hypertrophic obstructive cardiomyopathy ([place]) Her last appointment to see the cardiologist was over 2 years ago. Echocardiogram revealed that the patient have hypertrophic obstructive cardiomyopathy. She has not have repeat echocardiogram all cardiology follow-up and according to the last visit note, the patient elected to be managed conservatively. She denies recent chest pain,  shortness of breath or syncopal episode.  Asymptomatic microscopic hematuria The patient have chronic microscopic hematuria for 2 years. She is taking an iron supplement for chronic anemia. I reviewed the last CT scan with her which show multiple cysts on her kidneys but I am not certain that whether it could be the cause of the hematuria. She was referred by her internist to see a urologist but the patient subsequently decided not to proceed with the consultation. Again, I recommend her to consider urology evaluation for the cause of microscopic hematuria  Anemia of chronic kidney failure This is likely anemia of chronic disease. The patient denies recent history of bleeding such as epistaxis or hematochezia. She is asymptomatic from the anemia. We will observe for now.  I explained to her that iron deficiency anemia might not be the cause. I do not see any recent iron studies. She is comfortable continue on oral iron supplement for now   Orders Placed This Encounter  Procedures  . CBC with Differential/Platelet    Standing Status: Future     Number of Occurrences:      Standing Expiration Date: 01/06/2016    All questions were answered. The patient knows to call the clinic with any problems, questions or concerns. I spent 20 minutes counseling the patient face to face. The total time spent in the appointment was 25 minutes and more than 50% was on counseling.     [address], [place], MD 12/02/2014 1:01 PM

## 2014-12-02 NOTE — Assessment & Plan Note (Signed)
Her last appointment to see the cardiologist was over 2 years ago. Echocardiogram revealed that the patient have hypertrophic obstructive cardiomyopathy. She has not have repeat echocardiogram all cardiology follow-up and according to the last visit note, the patient elected to be managed conservatively. She denies recent chest pain, shortness of breath or syncopal episode.

## 2014-12-02 NOTE — Assessment & Plan Note (Signed)
The patient have chronic microscopic hematuria for 2 years. She is taking an iron supplement for chronic anemia. I reviewed the last CT scan with her which show multiple cysts on her kidneys but I am not certain that whether it could be the cause of the hematuria. She was referred by her internist to see a urologist but the patient subsequently decided not to proceed with the consultation. Again, I recommend her to consider urology evaluation for the cause of microscopic hematuria

## 2014-12-03 ENCOUNTER — Telehealth: Payer: Self-pay | Admitting: Hematology and Oncology

## 2014-12-03 NOTE — Telephone Encounter (Signed)
Left message for patient re appointment for November 2017. Schedule mailed.

## 2014-12-15 ENCOUNTER — Other Ambulatory Visit: Payer: Self-pay | Admitting: Internal Medicine

## 2014-12-15 NOTE — Telephone Encounter (Signed)
Done hardcopy to Dahlia  

## 2014-12-15 NOTE — Telephone Encounter (Signed)
Rx faxed to pharmacy  

## 2015-02-25 ENCOUNTER — Ambulatory Visit (INDEPENDENT_AMBULATORY_CARE_PROVIDER_SITE_OTHER): Payer: Commercial Managed Care - HMO | Admitting: Internal Medicine

## 2015-02-25 ENCOUNTER — Encounter: Payer: Self-pay | Admitting: Internal Medicine

## 2015-02-25 ENCOUNTER — Other Ambulatory Visit (INDEPENDENT_AMBULATORY_CARE_PROVIDER_SITE_OTHER): Payer: Commercial Managed Care - HMO

## 2015-02-25 VITALS — BP 140/78 | HR 75 | Temp 98.7°F | Resp 20 | Ht 62.0 in | Wt 145.0 lb

## 2015-02-25 DIAGNOSIS — E119 Type 2 diabetes mellitus without complications: Secondary | ICD-10-CM

## 2015-02-25 DIAGNOSIS — Z0000 Encounter for general adult medical examination without abnormal findings: Secondary | ICD-10-CM

## 2015-02-25 LAB — TSH: TSH: 3.11 u[IU]/mL (ref 0.35–4.50)

## 2015-02-25 LAB — HEPATIC FUNCTION PANEL
ALT: 12 U/L (ref 0–35)
AST: 21 U/L (ref 0–37)
Albumin: 4.1 g/dL (ref 3.5–5.2)
Alkaline Phosphatase: 54 U/L (ref 39–117)
BILIRUBIN DIRECT: 0.1 mg/dL (ref 0.0–0.3)
BILIRUBIN TOTAL: 0.4 mg/dL (ref 0.2–1.2)
Total Protein: 7.3 g/dL (ref 6.0–8.3)

## 2015-02-25 LAB — LIPID PANEL
CHOLESTEROL: 193 mg/dL (ref 0–200)
HDL: 69.1 mg/dL (ref >39.00)
LDL Cholesterol: 109 mg/dL — ABNORMAL HIGH (ref 0–99)
NonHDL: 123.85
TRIGLYCERIDES: 75 mg/dL (ref 0.0–149.0)
Total CHOL/HDL Ratio: 3
VLDL: 15 mg/dL (ref 0.0–40.0)

## 2015-02-25 LAB — URINALYSIS, ROUTINE W REFLEX MICROSCOPIC
Bilirubin Urine: NEGATIVE
Ketones, ur: NEGATIVE
Leukocytes, UA: NEGATIVE
Nitrite: NEGATIVE
Specific Gravity, Urine: 1.02
Total Protein, Urine: 100 — AB
Urine Glucose: NEGATIVE
Urobilinogen, UA: 0.2
pH: 6 (ref 5.0–8.0)

## 2015-02-25 LAB — MICROALBUMIN / CREATININE URINE RATIO
Creatinine,U: 63.3 mg/dL
MICROALB/CREAT RATIO: 128.9 mg/g — AB (ref 0.0–30.0)
Microalb, Ur: 81.6 mg/dL — ABNORMAL HIGH (ref 0.0–1.9)

## 2015-02-25 LAB — BASIC METABOLIC PANEL
BUN: 37 mg/dL — AB (ref 6–23)
CHLORIDE: 103 meq/L (ref 96–112)
CO2: 29 mEq/L (ref 19–32)
CREATININE: 1.11 mg/dL (ref 0.40–1.20)
Calcium: 9.6 mg/dL (ref 8.4–10.5)
GFR: 50.34 mL/min — AB (ref >60.00)
Glucose, Bld: 119 mg/dL — ABNORMAL HIGH (ref 70–99)
POTASSIUM: 4.4 meq/L (ref 3.5–5.1)
Sodium: 140 mEq/L (ref 135–145)

## 2015-02-25 LAB — CBC WITH DIFFERENTIAL/PLATELET
BASOS PCT: 0.7 % (ref 0.0–3.0)
Basophils Absolute: 0.1 10*3/uL (ref 0.0–0.1)
EOS PCT: 1.6 % (ref 0.0–5.0)
Eosinophils Absolute: 0.2 10*3/uL (ref 0.0–0.7)
HCT: 34.6 % — ABNORMAL LOW (ref 36.0–46.0)
Hemoglobin: 11.2 g/dL — ABNORMAL LOW (ref 12.0–15.0)
LYMPHS ABS: 4.2 10*3/uL — AB (ref 0.7–4.0)
Lymphocytes Relative: 44.1 % (ref 12.0–46.0)
MCHC: 32.5 g/dL (ref 30.0–36.0)
MCV: 90.9 fl (ref 78.0–100.0)
MONO ABS: 0.5 10*3/uL (ref 0.1–1.0)
Monocytes Relative: 5 % (ref 3.0–12.0)
NEUTROS ABS: 4.7 10*3/uL (ref 1.4–7.7)
NEUTROS PCT: 48.6 % (ref 43.0–77.0)
PLATELETS: 184 10*3/uL (ref 150.0–400.0)
RBC: 3.8 Mil/uL — ABNORMAL LOW (ref 3.87–5.11)
RDW: 15.4 % (ref 11.5–15.5)
WBC: 9.6 10*3/uL (ref 4.0–10.5)

## 2015-02-25 LAB — HEMOGLOBIN A1C: Hgb A1c MFr Bld: 5.7 % (ref 4.6–6.5)

## 2015-02-25 MED ORDER — GLUCOSE BLOOD VI STRP: ORAL_STRIP | Status: DC

## 2015-02-25 NOTE — Progress Notes (Signed)
Pre visit review using our clinic review tool, if applicable. No additional management support is needed unless otherwise documented below in the visit note. 

## 2015-02-25 NOTE — Progress Notes (Signed)
Subjective:    Patient ID: Norma Smith, female    DOB: 1933/02/07, 82 y.o.   MRN: [id number]  HPI  Here for wellness and f/u;  Overall doing ok;  Pt denies Chest pain, worsening SOB, DOE, wheezing, orthopnea, PND, worsening LE edema, palpitations, dizziness or syncope.  Pt denies neurological change such as new headache, facial or extremity weakness.  Pt denies polydipsia, polyuria, or low sugar symptoms. Pt states overall good compliance with treatment and medications, good tolerability, and has been trying to follow appropriate diet.  Pt denies worsening depressive symptoms, suicidal ideation or panic. No fever, night sweats, wt loss, loss of appetite, or other constitutional symptoms.  Pt states good ability with ADL's, has low fall risk, home safety reviewed and adequate, no other significant changes in hearing or vision, and only occasionally active with exercise. Has intermittent leg cramps at night, better with potassium. Declines tetanus. Past Medical History  Diagnosis Date  . Skin cancer   . Iron deficiency 12/01/2010  . Allergy   . Diabetes mellitus   . Hyperlipidemia   . Hypertension   . Anemia   . Osteopenia   . Dry skin dermatitis 12/01/2010  . CLL (chronic lymphoblastic leukemia) 12/01/2010  . Aortic stenosis, moderate 06/16/2011  . Blood transfusion without reported diagnosis 2010?  Marland Kitchen Stroke ([place])     1999  . Monoclonal B-cell lymphocytosis of unknown significance 12/02/2014   Past Surgical History  Procedure Laterality Date  . Breast biopsy  1976  . Cataract extraction    . Laparoscopic appendectomy N/A 12/13/2012    Procedure: APPENDECTOMY LAPAROSCOPIC;  Surgeon: Odis Hollingshead, MD;  Location: [facility];  Service: General;  Laterality: N/A;    reports that she has quit smoking. She has never used smokeless tobacco. She reports that she does not drink alcohol or use illicit drugs. family history includes Cancer in her maternal aunt and other; Diabetes in her mother,  other, and other; Heart disease in her maternal aunt and other. Allergies  Allergen Reactions  . Oxycodone Other (See Comments)    Visual Hallucinations  . Roxanol [Morphine] Hives and Rash  . Codeine Other (See Comments)    Passed out  . Penicillins Rash   Current Outpatient Prescriptions on File Prior to Visit  Medication Sig Dispense Refill  . acetaminophen (TYLENOL) 325 MG tablet Take 650 mg by mouth as needed for mild pain.     Marland Kitchen amLODipine (NORVASC) 5 MG tablet Take 1 tablet (5 mg total) by mouth daily. 90 tablet 3  . aspirin 81 MG tablet Take 81 mg by mouth daily.      Marland Kitchen atorvastatin (LIPITOR) 20 MG tablet Take 1 tablet (20 mg total) by mouth daily. 90 tablet 3  . Calcium Carbonate-Vitamin D (CALCIUM-D) 600-400 MG-UNIT TABS Take by mouth 2 (two) times daily.      . diazepam (VALIUM) 5 MG tablet TAKE 1/2 TABLET BY MOUTH DAILY 15 tablet 2  . Ferrous Sulfate (IRON) 325 (65 FE) MG TABS Take by mouth 2 (two) times daily.      . furosemide (LASIX) 40 MG tablet Take 1 tablet (40 mg total) by mouth daily. 90 tablet 3  . irbesartan (AVAPRO) 300 MG tablet Take 1 tablet (300 mg total) by mouth at bedtime. 90 tablet 3  . metFORMIN (GLUCOPHAGE) 1000 MG tablet Take 1 tablet (1,000 mg total) by mouth daily. 90 tablet 3  . metoprolol tartrate (LOPRESSOR) 25 MG tablet Take 0.5 tablets (12.5 mg total) by  mouth every morning. 90 tablet 2  . Multiple Vitamins-Minerals (CENTRUM SILVER PO) Take by mouth daily.      . pioglitazone (ACTOS) 15 MG tablet Take 1 tablet (15 mg total) by mouth daily. 90 tablet 3   No current facility-administered medications on file prior to visit.    Review of Systems Constitutional: Negative for increased diaphoresis, other activity, appetite or siginficant weight change other than noted HENT: Negative for worsening hearing loss, ear pain, facial swelling, mouth sores and neck stiffness.   Eyes: Negative for other worsening pain, redness or visual disturbance.    Respiratory: Negative for shortness of breath and wheezing  Cardiovascular: Negative for chest pain and palpitations.  Gastrointestinal: Negative for diarrhea, blood in stool, abdominal distention or other pain Genitourinary: Negative for hematuria, flank pain or change in urine volume.  Musculoskeletal: Negative for myalgias or other joint complaints.  Skin: Negative for color change and wound or drainage.  Neurological: Negative for syncope and numbness. other than noted Hematological: Negative for adenopathy. or other swelling Psychiatric/Behavioral: Negative for hallucinations, SI, self-injury, decreased concentration or other worsening agitation.      Objective:   Physical Exam BP 140/78 mmHg  Pulse 75  Temp(Src) 98.7 F (37.1 C) (Oral)  Resp 20  Ht 5\' 2"  (1.575 m)  Wt 145 lb (65.772 kg)  BMI 26.51 kg/m2  SpO2 94% VS noted, not ill appearing Constitutional: Pt is oriented to person, place, and time. Appears well-developed and well-nourished, in no significant distress Head: Normocephalic and atraumatic.  Right Ear: External ear normal.  Left Ear: External ear normal.  Nose: Nose normal.  Mouth/Throat: Oropharynx is clear and moist.  Eyes: Conjunctivae and EOM are normal. Pupils are equal, round, and reactive to light.  Neck: Normal range of motion. Neck supple. No JVD present. No tracheal deviation present or significant neck LA or mass Cardiovascular: Normal rate, regular rhythm, normal heart sounds and intact distal pulses.   Pulmonary/Chest: Effort normal and breath sounds without rales or wheezing  Abdominal: Soft. Bowel sounds are normal. NT. No HSM  Musculoskeletal: Normal range of motion. Exhibits no edema.  Lymphadenopathy:  Has no cervical adenopathy.  Neurological: Pt is alert and oriented to person, place, and time. Pt has normal reflexes. No cranial nerve deficit. Motor grossly intact Skin: Skin is warm and dry. No rash noted.  Psychiatric:  Has normal mood  and affect. Behavior is normal.     Assessment & Plan:

## 2015-02-25 NOTE — Patient Instructions (Signed)

## 2015-02-26 NOTE — Assessment & Plan Note (Signed)

## 2015-02-26 NOTE — Assessment & Plan Note (Signed)
stable overall by history and exam, recent data reviewed with pt, and pt to continue medical treatment as before,  to f/u any worsening symptoms or concerns Lab Results  Component Value Date   HGBA1C 5.7 02/25/2015

## 2015-02-27 ENCOUNTER — Other Ambulatory Visit: Payer: Self-pay | Admitting: Internal Medicine

## 2015-03-01 ENCOUNTER — Other Ambulatory Visit: Payer: Self-pay

## 2015-03-01 MED ORDER — GLUCOSE BLOOD VI STRP: ORAL_STRIP | Status: DC

## 2015-04-07 ENCOUNTER — Other Ambulatory Visit: Payer: Self-pay | Admitting: Internal Medicine

## 2015-04-07 NOTE — Telephone Encounter (Signed)
PLease advise

## 2015-04-07 NOTE — Telephone Encounter (Signed)
Done hardcopy to corinne 

## 2015-04-08 NOTE — Telephone Encounter (Signed)
Medication sent to pharmacy  

## 2015-05-07 IMAGING — CR DG ABD PORTABLE 1V
1 series · 1 of 1 positions shown · non-contrast
Comparison: 12/13/2012.

CLINICAL DATA: Postoperative abdominal distention.

EXAM:
PORTABLE ABDOMEN - 1 VIEW

[ap (kub)]
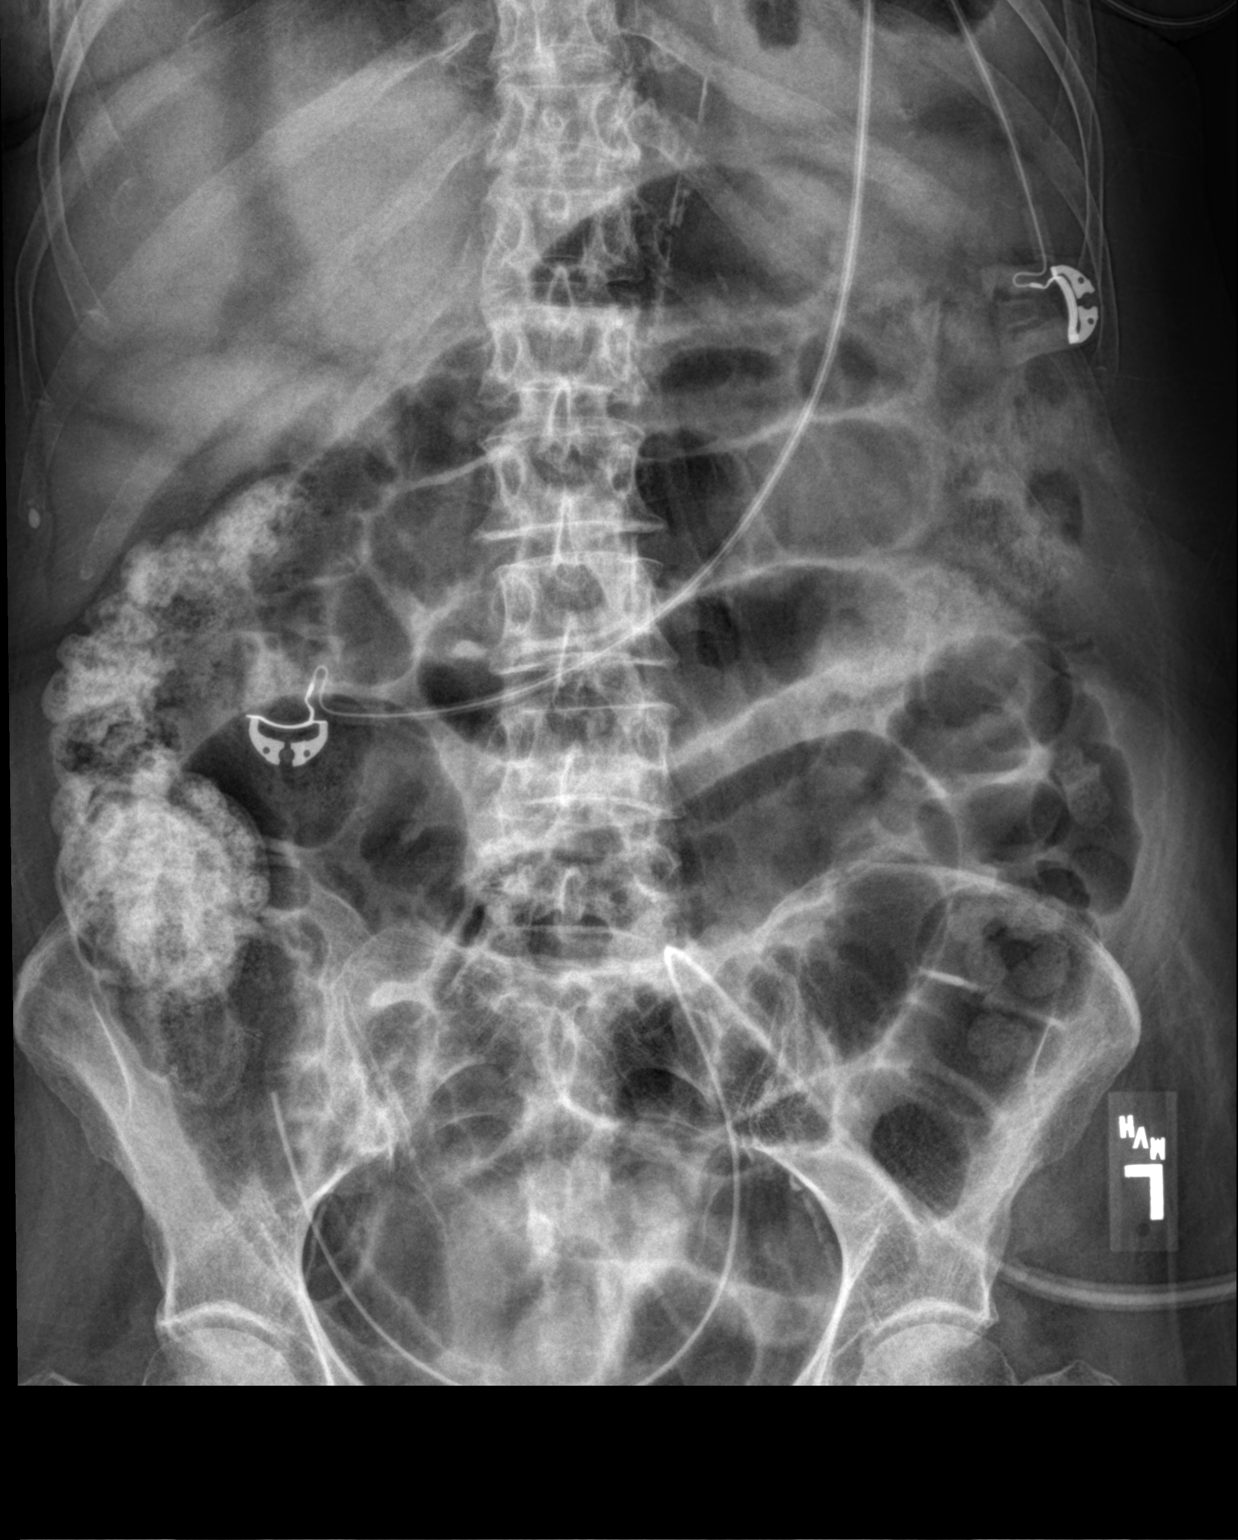

[1 of 1 positions shown; findings below may reference images not displayed]

FINDINGS: Gaseous distension of large and small bowel is present, most
compatible with postoperative ileus. There is hyperlucency over the
liver shadow, likely representing a small amount of residual free
air. There is no enteric tube present. No stool is identified within
the rectosigmoid. Contrast is present in the ascending colon.
IMPRESSION: 1. Surgical drain in the anatomic pelvis compatible with recent
operation.
2. Hyperlucency over the liver shadow compatible with residual
intra-abdominal free air.
3. Small bowel predominant gaseous distension of bowel most
compatible with postoperative ileus.

## 2015-05-12 ENCOUNTER — Other Ambulatory Visit: Payer: Self-pay

## 2015-05-12 MED ORDER — ATORVASTATIN CALCIUM 20 MG PO TABS: 20.0000 mg | ORAL_TABLET | Freq: Every day | ORAL | Status: DC

## 2015-05-12 MED ORDER — IRBESARTAN 300 MG PO TABS: 300.0000 mg | ORAL_TABLET | Freq: Every day | ORAL | Status: DC

## 2015-05-12 MED ORDER — FUROSEMIDE 40 MG PO TABS: 40.0000 mg | ORAL_TABLET | Freq: Every day | ORAL | Status: DC

## 2015-05-12 MED ORDER — AMLODIPINE BESYLATE 5 MG PO TABS: 5.0000 mg | ORAL_TABLET | Freq: Every day | ORAL | Status: DC

## 2015-05-12 MED ORDER — PIOGLITAZONE HCL 15 MG PO TABS: 15.0000 mg | ORAL_TABLET | Freq: Every day | ORAL | Status: DC

## 2015-05-13 ENCOUNTER — Telehealth: Payer: Self-pay | Admitting: *Deleted

## 2015-05-13 MED ORDER — AMLODIPINE BESYLATE 5 MG PO TABS: 5.0000 mg | ORAL_TABLET | Freq: Every day | ORAL | Status: DC

## 2015-05-13 MED ORDER — FUROSEMIDE 40 MG PO TABS: 40.0000 mg | ORAL_TABLET | Freq: Every day | ORAL | Status: DC

## 2015-05-13 MED ORDER — METFORMIN HCL 1000 MG PO TABS: 1000.0000 mg | ORAL_TABLET | Freq: Every day | ORAL | Status: DC

## 2015-05-13 MED ORDER — METOPROLOL TARTRATE 25 MG PO TABS: 12.5000 mg | ORAL_TABLET | Freq: Every morning | ORAL | Status: DC

## 2015-05-13 MED ORDER — ATORVASTATIN CALCIUM 20 MG PO TABS: 20.0000 mg | ORAL_TABLET | Freq: Every day | ORAL | Status: DC

## 2015-05-13 MED ORDER — PIOGLITAZONE HCL 15 MG PO TABS: 15.0000 mg | ORAL_TABLET | Freq: Every day | ORAL | Status: DC

## 2015-05-13 MED ORDER — IRBESARTAN 300 MG PO TABS: 300.0000 mg | ORAL_TABLET | Freq: Every day | ORAL | Status: DC

## 2015-05-13 NOTE — Telephone Encounter (Signed)
A user error has taken place.

## 2015-05-13 NOTE — Telephone Encounter (Signed)
Left msg on triage stating [employer] states they have sent request to have her medications renewed, but they have not heard from office. Called pt inform her per chart MD assistant sent medications to [facility] instead of [facility]. Inform her will have to contact [facility] to cancel scripts then I can send new rx's to [facility]. Called pharmacy spoke with [facility] inform her status on meds that was sent. She has cancel all 5 scripts. Sent [employer] new rx's on meds...Johny Chess

## 2015-07-06 ENCOUNTER — Telehealth: Payer: Self-pay

## 2015-07-06 MED ORDER — DIAZEPAM 5 MG PO TABS: 2.5000 mg | ORAL_TABLET | Freq: Every day | ORAL | Status: DC

## 2015-07-06 NOTE — Telephone Encounter (Signed)
Please advise patient is requesting refill on valium

## 2015-07-06 NOTE — Telephone Encounter (Signed)
Done hardcopy to Corinne  

## 2015-07-06 NOTE — Addendum Note (Signed)
Addended by: Biagio Borg on: 07/06/2015 07:05 PM   Modules accepted: Orders

## 2015-07-07 NOTE — Telephone Encounter (Signed)
Medication refill sent to pharmacy  

## 2015-08-23 ENCOUNTER — Other Ambulatory Visit (INDEPENDENT_AMBULATORY_CARE_PROVIDER_SITE_OTHER): Payer: Commercial Managed Care - HMO

## 2015-08-23 DIAGNOSIS — E119 Type 2 diabetes mellitus without complications: Secondary | ICD-10-CM

## 2015-08-23 LAB — BASIC METABOLIC PANEL
BUN: 35 mg/dL — AB (ref 6–23)
CHLORIDE: 107 meq/L (ref 96–112)
CO2: 28 meq/L (ref 19–32)
CREATININE: 1.4 mg/dL — AB (ref 0.40–1.20)
Calcium: 9.5 mg/dL (ref 8.4–10.5)
GFR: 38.46 mL/min — ABNORMAL LOW (ref >60.00)
Glucose, Bld: 103 mg/dL — ABNORMAL HIGH (ref 70–99)
Potassium: 4.4 mEq/L (ref 3.5–5.1)
Sodium: 144 mEq/L (ref 135–145)

## 2015-08-23 LAB — HEPATIC FUNCTION PANEL
ALBUMIN: 3.8 g/dL (ref 3.5–5.2)
ALK PHOS: 47 U/L (ref 39–117)
ALT: 10 U/L (ref 0–35)
AST: 19 U/L (ref 0–37)
Bilirubin, Direct: 0.1 mg/dL (ref 0.0–0.3)
Total Bilirubin: 0.4 mg/dL (ref 0.2–1.2)
Total Protein: 6.7 g/dL (ref 6.0–8.3)

## 2015-08-23 LAB — HEMOGLOBIN A1C: HEMOGLOBIN A1C: 5.1 % (ref 4.6–6.5)

## 2015-08-23 LAB — LIPID PANEL
CHOLESTEROL: 150 mg/dL (ref 0–200)
HDL: 56.4 mg/dL (ref >39.00)
LDL Cholesterol: 81 mg/dL (ref 0–99)
NonHDL: 93.67
TRIGLYCERIDES: 61 mg/dL (ref 0.0–149.0)
Total CHOL/HDL Ratio: 3
VLDL: 12.2 mg/dL (ref 0.0–40.0)

## 2015-08-25 ENCOUNTER — Ambulatory Visit (INDEPENDENT_AMBULATORY_CARE_PROVIDER_SITE_OTHER): Payer: Commercial Managed Care - HMO | Admitting: Internal Medicine

## 2015-08-25 VITALS — BP 146/60 | HR 55 | Temp 97.5°F | Wt 146.0 lb

## 2015-08-25 DIAGNOSIS — I10 Essential (primary) hypertension: Secondary | ICD-10-CM

## 2015-08-25 DIAGNOSIS — N183 Chronic kidney disease, stage 3 unspecified: Secondary | ICD-10-CM

## 2015-08-25 DIAGNOSIS — E785 Hyperlipidemia, unspecified: Secondary | ICD-10-CM

## 2015-08-25 DIAGNOSIS — Z0001 Encounter for general adult medical examination with abnormal findings: Secondary | ICD-10-CM

## 2015-08-25 DIAGNOSIS — E119 Type 2 diabetes mellitus without complications: Secondary | ICD-10-CM | POA: Diagnosis not present

## 2015-08-25 DIAGNOSIS — N184 Chronic kidney disease, stage 4 (severe): Secondary | ICD-10-CM | POA: Insufficient documentation

## 2015-08-25 DIAGNOSIS — R6889 Other general symptoms and signs: Secondary | ICD-10-CM

## 2015-08-25 NOTE — Assessment & Plan Note (Signed)
Mild worsening, could consider d/c avapro with f/u lab at one wk, but pt concerned about increased BP, declines med change or renal referral at this time

## 2015-08-25 NOTE — Assessment & Plan Note (Signed)
Persistent mild elevated on mult meds, declines change norvasc to hydralazine as does not want tid med,  to f/u any worsening symptoms or concerns

## 2015-08-25 NOTE — Assessment & Plan Note (Signed)
stable overall by history and exam, recent data reviewed with pt, and pt to continue medical treatment as before,  to f/u any worsening symptoms or concerns Lab Results  Component Value Date   LDLCALC 81 08/23/2015   Goal < 70, to work on diet as well

## 2015-08-25 NOTE — Patient Instructions (Signed)
OK to stop the Actos   Please continue all other medications as before, and refills have been done if requested.  Please have the pharmacy call with any other refills you may need.  Please continue your efforts at being more active, low cholesterol diet, and weight control.  Please keep your appointments with your specialists as you may have planned  Please return in 6 months, or sooner if needed, with Lab testing done 3-5 days before

## 2015-08-25 NOTE — Assessment & Plan Note (Signed)
Now overcontrolled with better diet,  Lab Results  Component Value Date   HGBA1C 5.1 08/23/2015   To d/c actos, may help with tendency for leg swelling as well

## 2015-08-25 NOTE — Progress Notes (Signed)
Pre visit review using our clinic review tool, if applicable. No additional management support is needed unless otherwise documented below in the visit note. 

## 2015-08-25 NOTE — Progress Notes (Signed)
Subjective:    Patient ID: Norma Smith, female    DOB: Jul 02, 1933, 82 y.o.   MRN: [id number]  HPI  Here to f/u; overall doing ok,  Pt denies chest pain, increasing sob or doe, wheezing, orthopnea, PND, increased LE swelling, palpitations, dizziness or syncope.  Pt denies new neurological symptoms such as new headache, or facial or extremity weakness or numbness.  Pt denies polydipsia, polyuria, but has several recent low sugar episode with shaky,sweaty. Appetite ok but somewhat less overall recently..   Pt denies new neurological symptoms such as new headache, or facial or extremity weakness or numbness.   Pt states overall good compliance with meds, mostly trying to follow appropriate diet, with wt overall stable,  Mostly sedentary at home these days, trying to make it on fixed income/  No other new complaints Wt Readings from Last 3 Encounters:  08/25/15 146 lb (66.2 kg)  02/25/15 145 lb (65.8 kg)  12/02/14 148 lb 3.2 oz (67.2 kg)   BP Readings from Last 3 Encounters:  08/25/15 (!) 146/60  02/25/15 140/78  12/02/14 (!) 132/99   Past Medical History:  Diagnosis Date  . Allergy   . Anemia   . Aortic stenosis, moderate 06/16/2011  . Blood transfusion without reported diagnosis 2010?  . CLL (chronic lymphoblastic leukemia) 12/01/2010  . Diabetes mellitus   . Dry skin dermatitis 12/01/2010  . Hyperlipidemia   . Hypertension   . Iron deficiency 12/01/2010  . Monoclonal B-cell lymphocytosis of unknown significance 12/02/2014  . Osteopenia   . Skin cancer   . Stroke [facility])    1999   Past Surgical History:  Procedure Laterality Date  . BREAST BIOPSY  1976  . CATARACT EXTRACTION    . LAPAROSCOPIC APPENDECTOMY N/A 12/13/2012   Procedure: APPENDECTOMY LAPAROSCOPIC;  Surgeon: Odis Hollingshead, MD;  Location: [facility];  Service: General;  Laterality: N/A;    reports that she has quit smoking. She has never used smokeless tobacco. She reports that she does not drink alcohol or use  drugs. family history includes Cancer in her maternal aunt and other; Diabetes in her mother, other, and other; Heart disease in her maternal aunt and other. Allergies  Allergen Reactions  . Oxycodone Other (See Comments)    Visual Hallucinations  . Roxanol [Morphine] Hives and Rash  . Codeine Other (See Comments)    Passed out  . Penicillins Rash   Current Outpatient Prescriptions on File Prior to Visit  Medication Sig Dispense Refill  . acetaminophen (TYLENOL) 325 MG tablet Take 650 mg by mouth as needed for mild pain.     Marland Kitchen amLODipine (NORVASC) 5 MG tablet Take 1 tablet (5 mg total) by mouth daily. 90 tablet 3  . aspirin 81 MG tablet Take 81 mg by mouth daily.      Marland Kitchen atorvastatin (LIPITOR) 20 MG tablet Take 1 tablet (20 mg total) by mouth daily. 90 tablet 3  . Calcium Carbonate-Vitamin D (CALCIUM-D) 600-400 MG-UNIT TABS Take by mouth 2 (two) times daily.      . diazepam (VALIUM) 5 MG tablet Take 0.5 tablets (2.5 mg total) by mouth daily. 15 tablet 2  . Ferrous Sulfate (IRON) 325 (65 FE) MG TABS Take by mouth 2 (two) times daily.      . furosemide (LASIX) 40 MG tablet Take 1 tablet (40 mg total) by mouth daily. 90 tablet 3  . glucose blood test strip Used to test blood sugar 3 times daily 100 each 12  . irbesartan (  AVAPRO) 300 MG tablet Take 1 tablet (300 mg total) by mouth at bedtime. 90 tablet 3  . metFORMIN (GLUCOPHAGE) 1000 MG tablet Take 1 tablet (1,000 mg total) by mouth daily. 90 tablet 3  . metoprolol tartrate (LOPRESSOR) 25 MG tablet Take 0.5 tablets (12.5 mg total) by mouth every morning. 90 tablet 2  . Multiple Vitamins-Minerals (CENTRUM SILVER PO) Take by mouth daily.      . pioglitazone (ACTOS) 15 MG tablet Take 1 tablet (15 mg total) by mouth daily. 90 tablet 3   No current facility-administered medications on file prior to visit.    Review of Systems  Constitutional: Negative for unusual diaphoresis or night sweats HENT: Negative for ear swelling or discharge Eyes:  Negative for worsening visual haziness  Respiratory: Negative for choking and stridor.   Gastrointestinal: Negative for distension or worsening eructation Genitourinary: Negative for retention or change in urine volume.  Musculoskeletal: Negative for other MSK pain or swelling Skin: Negative for color change and worsening wound Neurological: Negative for tremors and numbness other than noted  Psychiatric/Behavioral: Negative for decreased concentration or agitation other than above       Objective:   Physical Exam BP (!) 146/60   Pulse (!) 55   Temp 97.5 F (36.4 C)   Wt 146 lb (66.2 kg)   SpO2 94%   BMI 26.70 kg/m  VS noted,  Constitutional: Pt appears in no apparent distress HENT: Head: NCAT.  Right Ear: External ear normal.  Left Ear: External ear normal.  Eyes: . Pupils are equal, round, and reactive to light. Conjunctivae and EOM are normal Neck: Normal range of motion. Neck supple.  Cardiovascular: Normal rate and regular rhythm.   Pulmonary/Chest: Effort normal and breath sounds without rales or wheezing.  Abd:  Soft, NT, ND, + BS Neurological: Pt is alert. Not confused , motor grossly intact Skin: Skin is warm. No rash, no LE edema Psychiatric: Pt behavior is normal. No agitation.     Assessment & Plan:

## 2015-09-08 DIAGNOSIS — H524 Presbyopia: Secondary | ICD-10-CM | POA: Diagnosis not present

## 2015-09-08 DIAGNOSIS — H5210 Myopia, unspecified eye: Secondary | ICD-10-CM | POA: Diagnosis not present

## 2015-10-09 ENCOUNTER — Other Ambulatory Visit: Payer: Self-pay | Admitting: Internal Medicine

## 2015-10-11 NOTE — Telephone Encounter (Signed)
Done hardcopy to Corinne  

## 2015-10-11 NOTE — Telephone Encounter (Signed)
faxed

## 2015-12-02 ENCOUNTER — Ambulatory Visit: Payer: Medicare Other | Admitting: Hematology and Oncology

## 2015-12-02 ENCOUNTER — Other Ambulatory Visit: Payer: Medicare Other

## 2015-12-27 ENCOUNTER — Telehealth: Payer: Self-pay | Admitting: *Deleted

## 2015-12-27 ENCOUNTER — Other Ambulatory Visit: Payer: Self-pay | Admitting: Internal Medicine

## 2015-12-27 MED ORDER — DIAZEPAM 5 MG PO TABS: ORAL_TABLET | ORAL | 1 refills | Status: DC

## 2015-12-27 NOTE — Telephone Encounter (Signed)
Rec'd fax pt requesting refill on her Diazepam 5 mg. Last filled 10/11/15...Johny Chess

## 2015-12-27 NOTE — Telephone Encounter (Signed)
Done hardcopy to Corinne  

## 2015-12-27 NOTE — Telephone Encounter (Signed)
faxed

## 2016-02-27 ENCOUNTER — Other Ambulatory Visit: Payer: Self-pay | Admitting: Internal Medicine

## 2016-03-08 ENCOUNTER — Other Ambulatory Visit (INDEPENDENT_AMBULATORY_CARE_PROVIDER_SITE_OTHER): Payer: Commercial Managed Care - HMO

## 2016-03-08 DIAGNOSIS — Z0001 Encounter for general adult medical examination with abnormal findings: Secondary | ICD-10-CM

## 2016-03-08 DIAGNOSIS — E119 Type 2 diabetes mellitus without complications: Secondary | ICD-10-CM | POA: Diagnosis not present

## 2016-03-08 LAB — URINALYSIS, ROUTINE W REFLEX MICROSCOPIC
BILIRUBIN URINE: NEGATIVE
Ketones, ur: NEGATIVE
NITRITE: NEGATIVE
PH: 5.5 (ref 5.0–8.0)
Specific Gravity, Urine: 1.015 (ref 1.000–1.030)
Urine Glucose: NEGATIVE
Urobilinogen, UA: 0.2 (ref 0.0–1.0)

## 2016-03-08 LAB — HEPATIC FUNCTION PANEL
ALK PHOS: 48 U/L (ref 39–117)
ALT: 10 U/L (ref 0–35)
AST: 18 U/L (ref 0–37)
Albumin: 4 g/dL (ref 3.5–5.2)
Bilirubin, Direct: 0.1 mg/dL (ref 0.0–0.3)
Total Bilirubin: 0.4 mg/dL (ref 0.2–1.2)
Total Protein: 6.8 g/dL (ref 6.0–8.3)

## 2016-03-08 LAB — CBC WITH DIFFERENTIAL/PLATELET
Basophils Absolute: 0.1 10*3/uL (ref 0.0–0.1)
Basophils Relative: 0.8 % (ref 0.0–3.0)
EOS PCT: 1.5 % (ref 0.0–5.0)
Eosinophils Absolute: 0.2 10*3/uL (ref 0.0–0.7)
HCT: 29.3 % — ABNORMAL LOW (ref 36.0–46.0)
HEMOGLOBIN: 9.8 g/dL — AB (ref 12.0–15.0)
Lymphocytes Relative: 37.1 % (ref 12.0–46.0)
Lymphs Abs: 3.9 10*3/uL (ref 0.7–4.0)
MCHC: 33.4 g/dL (ref 30.0–36.0)
MCV: 93.4 fl (ref 78.0–100.0)
MONO ABS: 0.7 10*3/uL (ref 0.1–1.0)
Monocytes Relative: 6.8 % (ref 3.0–12.0)
NEUTROS ABS: 5.7 10*3/uL (ref 1.4–7.7)
Neutrophils Relative %: 53.8 % (ref 43.0–77.0)
Platelets: 205 10*3/uL (ref 150.0–400.0)
RBC: 3.13 Mil/uL — ABNORMAL LOW (ref 3.87–5.11)
RDW: 14.7 % (ref 11.5–15.5)
WBC: 10.6 10*3/uL — ABNORMAL HIGH (ref 4.0–10.5)

## 2016-03-08 LAB — MICROALBUMIN / CREATININE URINE RATIO
CREATININE, U: 203 mg/dL
MICROALB/CREAT RATIO: 3 mg/g (ref 0.0–30.0)
Microalb, Ur: 6.1 mg/dL — ABNORMAL HIGH (ref 0.0–1.9)

## 2016-03-08 LAB — BASIC METABOLIC PANEL
BUN: 54 mg/dL — AB (ref 6–23)
CHLORIDE: 108 meq/L (ref 96–112)
CO2: 29 mEq/L (ref 19–32)
Calcium: 9.5 mg/dL (ref 8.4–10.5)
Creatinine, Ser: 1.49 mg/dL — ABNORMAL HIGH (ref 0.40–1.20)
GFR: 35.75 mL/min — ABNORMAL LOW (ref >60.00)
Glucose, Bld: 120 mg/dL — ABNORMAL HIGH (ref 70–99)
POTASSIUM: 4.5 meq/L (ref 3.5–5.1)
Sodium: 142 mEq/L (ref 135–145)

## 2016-03-08 LAB — HEMOGLOBIN A1C: Hgb A1c MFr Bld: 5.5 % (ref 4.6–6.5)

## 2016-03-08 LAB — LIPID PANEL
CHOL/HDL RATIO: 3
Cholesterol: 157 mg/dL (ref 0–200)
HDL: 56.9 mg/dL (ref >39.00)
LDL Cholesterol: 83 mg/dL (ref 0–99)
NonHDL: 100.32
TRIGLYCERIDES: 85 mg/dL (ref 0.0–149.0)
VLDL: 17 mg/dL (ref 0.0–40.0)

## 2016-03-08 LAB — TSH: TSH: 5.75 u[IU]/mL — AB (ref 0.35–4.50)

## 2016-03-13 ENCOUNTER — Encounter: Payer: Self-pay | Admitting: Internal Medicine

## 2016-03-13 ENCOUNTER — Ambulatory Visit (INDEPENDENT_AMBULATORY_CARE_PROVIDER_SITE_OTHER): Payer: Medicare HMO | Admitting: Internal Medicine

## 2016-03-13 VITALS — BP 122/68 | HR 59 | Temp 97.6°F | Ht 61.0 in | Wt 142.0 lb

## 2016-03-13 DIAGNOSIS — E038 Other specified hypothyroidism: Secondary | ICD-10-CM

## 2016-03-13 DIAGNOSIS — Z7189 Other specified counseling: Secondary | ICD-10-CM | POA: Diagnosis not present

## 2016-03-13 DIAGNOSIS — N183 Chronic kidney disease, stage 3 unspecified: Secondary | ICD-10-CM

## 2016-03-13 DIAGNOSIS — Z0001 Encounter for general adult medical examination with abnormal findings: Secondary | ICD-10-CM

## 2016-03-13 DIAGNOSIS — D631 Anemia in chronic kidney disease: Secondary | ICD-10-CM

## 2016-03-13 DIAGNOSIS — E039 Hypothyroidism, unspecified: Secondary | ICD-10-CM | POA: Diagnosis not present

## 2016-03-13 DIAGNOSIS — E119 Type 2 diabetes mellitus without complications: Secondary | ICD-10-CM

## 2016-03-13 DIAGNOSIS — R3121 Asymptomatic microscopic hematuria: Secondary | ICD-10-CM | POA: Diagnosis not present

## 2016-03-13 NOTE — Progress Notes (Signed)
Subjective:    Patient ID: Norma Smith, female    DOB: [birth year], 83 y.o.   MRN: [id number]  HPI  Here for wellness and f/u;  Overall doing ok;  Pt denies Chest pain, worsening SOB, DOE, wheezing, orthopnea, PND, worsening LE edema, palpitations, dizziness or syncope.  Pt denies neurological change such as new headache, facial or extremity weakness.  Pt denies polydipsia, polyuria, or low sugar symptoms. Pt states overall good compliance with treatment and medications, good tolerability, and has been trying to follow appropriate diet.  Pt denies worsening depressive symptoms, suicidal ideation or panic. No fever, night sweats, loss of appetite, or other constitutional symptoms.  Pt states good ability with ADL's, has low fall risk, home safety reviewed and adequate, no other significant changes in hearing or vision, and only occasionally active with exercise. Wt overall down several lbs Wt Readings from Last 3 Encounters:  03/13/16 142 lb (64.4 kg)  08/25/15 146 lb (66.2 kg)  02/25/15 145 lb (65.8 kg)   BP Readings from Last 3 Encounters:  03/13/16 122/68  08/25/15 (!) 146/60  02/25/15 140/78  Declines tetanus.  Declines aggressive eval or tx related to microhematuria, even if means possible malignancy.  Did not go back to oncology and "Im not going to worry about it"  - skpped her nov 2017 appt.  Feels tired, "ready to go" . Denies worsening depressive symptoms, suicidal ideation, or panic.   Denies hyper or hypo thyroid symptoms such as voice, skin or hair change. Past Medical History:  Diagnosis Date  . Allergy   . Anemia   . Aortic stenosis, moderate 06/16/2011  . Blood transfusion without reported diagnosis 2010?  . CLL (chronic lymphoblastic leukemia) 12/01/2010  . Diabetes mellitus   . Dry skin dermatitis 12/01/2010  . Hyperlipidemia   . Hypertension   . Iron deficiency 12/01/2010  . Monoclonal B-cell lymphocytosis of unknown significance 12/02/2014  . Osteopenia   . Skin cancer    . Stroke [facility])    1999   Past Surgical History:  Procedure Laterality Date  . BREAST BIOPSY  1976  . CATARACT EXTRACTION    . LAPAROSCOPIC APPENDECTOMY N/A 12/13/2012   Procedure: APPENDECTOMY LAPAROSCOPIC;  Surgeon: Odis Hollingshead, MD;  Location: [facility];  Service: General;  Laterality: N/A;    reports that she has quit smoking. She has never used smokeless tobacco. She reports that she does not drink alcohol or use drugs. family history includes Cancer in her maternal aunt and other; Diabetes in her mother, other, and other; Heart disease in her maternal aunt and other. Allergies  Allergen Reactions  . Oxycodone Other (See Comments)    Visual Hallucinations  . Roxanol [Morphine] Hives and Rash  . Codeine Other (See Comments)    Passed out  . Penicillins Rash   Current Outpatient Prescriptions on File Prior to Visit  Medication Sig Dispense Refill  . acetaminophen (TYLENOL) 325 MG tablet Take 650 mg by mouth as needed for mild pain.     Marland Kitchen amLODipine (NORVASC) 5 MG tablet TAKE 1 TABLET EVERY DAY 90 tablet 3  . aspirin 81 MG tablet Take 81 mg by mouth daily.      Marland Kitchen atorvastatin (LIPITOR) 20 MG tablet TAKE 1 TABLET (20 MG TOTAL) BY MOUTH DAILY. 90 tablet 3  . Calcium Carbonate-Vitamin D (CALCIUM-D) 600-400 MG-UNIT TABS Take by mouth 2 (two) times daily.      . diazepam (VALIUM) 5 MG tablet 1/2 tab per day as needed for  stress 30 tablet 1  . Ferrous Sulfate (IRON) 325 (65 FE) MG TABS Take by mouth 2 (two) times daily.      . furosemide (LASIX) 40 MG tablet TAKE 1 TABLET EVERY DAY 90 tablet 3  . glucose blood test strip Used to test blood sugar 3 times daily 100 each 12  . irbesartan (AVAPRO) 300 MG tablet TAKE 1 TABLET AT BEDTIME 90 tablet 3  . metFORMIN (GLUCOPHAGE) 1000 MG tablet TAKE 1 TABLET EVERY DAY 90 tablet 3  . metoprolol tartrate (LOPRESSOR) 25 MG tablet Take 0.5 tablets (12.5 mg total) by mouth every morning. 90 tablet 2  . Multiple Vitamins-Minerals (CENTRUM SILVER PO)  Take by mouth daily.       No current facility-administered medications on file prior to visit.    Review of Systems Constitutional: Negative for increased diaphoresis, or other activity, appetite or siginficant weight change other than noted HENT: Negative for worsening hearing loss, ear pain, facial swelling, mouth sores and neck stiffness.   Eyes: Negative for other worsening pain, redness or visual disturbance.  Respiratory: Negative for choking or stridor Cardiovascular: Negative for other chest pain and palpitations.  Gastrointestinal: Negative for worsening diarrhea, blood in stool, or abdominal distention Genitourinary: Negative for hematuria, flank pain or change in urine volume.  Musculoskeletal: Negative for myalgias or other joint complaints.  Skin: Negative for other color change and wound or drainage.  Neurological: Negative for syncope and numbness. other than noted Hematological: Negative for adenopathy. or other swelling Psychiatric/Behavioral: Negative for hallucinations, SI, self-injury, decreased concentration or other worsening agitation.  All other system neg per pt    Objective:   Physical Exam BP 122/68   Pulse (!) 59   Temp 97.6 F (36.4 C)   Ht 5\' 1"  (1.549 m)   Wt 142 lb (64.4 kg)   SpO2 98%   BMI 26.83 kg/m  VS noted,  Constitutional: Pt is oriented to person, place, and time. Appears well-developed and well-nourished, in no significant distress Head: Normocephalic and atraumatic  Eyes: Conjunctivae and EOM are normal. Pupils are equal, round, and reactive to light Right Ear: External ear normal.  Left Ear: External ear normal Nose: Nose normal.  Mouth/Throat: Oropharynx is clear and moist  Neck: Normal range of motion. Neck supple. No JVD present. No tracheal deviation present or significant neck LA or mass Cardiovascular: Normal rate, regular rhythm, normal heart sounds and intact distal pulses.   Pulmonary/Chest: Effort normal and breath sounds  without rales or wheezing  Abdominal: Soft. Bowel sounds are normal. NT. No HSM  Musculoskeletal: Normal range of motion. Exhibits no edema Lymphadenopathy: Has no cervical adenopathy.  Neurological: Pt is alert and oriented to person, place, and time. Pt has normal reflexes. No cranial nerve deficit. Motor grossly intact Skin: Skin is warm and dry. No rash noted or new ulcers Psychiatric:  Has normal mood and affect. Behavior is normal.  No other exam findings    Assessment & Plan:

## 2016-03-13 NOTE — Patient Instructions (Signed)
Please continue all other medications as before, and refills have been done if requested.  Please have the pharmacy call with any other refills you may need.  Please continue your efforts at being more active, low cholesterol diet, and weight control.  You are otherwise up to date with prevention measures today.  Please keep your appointments with your specialists as you may have planned  Please return in 6 months, or sooner if needed 

## 2016-03-18 NOTE — Assessment & Plan Note (Signed)
Lab Results  Component Value Date   WBC 10.6 (H) 03/08/2016   HGB 9.8 (L) 03/08/2016   HCT 29.3 (L) 03/08/2016   MCV 93.4 03/08/2016   PLT 205.0 03/08/2016   stable overall by history and exam, recent data reviewed with pt, and pt to continue medical treatment as before,  to f/u any worsening symptoms or concerns

## 2016-03-18 NOTE — Assessment & Plan Note (Signed)
Mild,  Lab Results  Component Value Date   TSH 5.75 (H) 03/08/2016   Cont same tx,  to f/u any worsening symptoms or concerns

## 2016-03-18 NOTE — Assessment & Plan Note (Addendum)
stable overall by history and exam, recent data reviewed with pt, and pt to continue medical treatment as before,  to f/u any worsening symptoms or concerns Lab Results  Component Value Date   HGBA1C 5.5 03/08/2016  In addition to the time spent performing CPE, I spent an additional 25 minutes face to face,in which greater than 50% of this time was spent in counseling and coordination of care for patient's acute illness as documented.

## 2016-03-18 NOTE — Assessment & Plan Note (Signed)
D/w pt, is quite certain that in the event of cardiopulm arrest, she does not want heroic measures such as CPR, defibrillation, medications or intubation

## 2016-03-18 NOTE — Assessment & Plan Note (Signed)

## 2016-03-18 NOTE — Assessment & Plan Note (Signed)
Chronic persistent, asympt, declines urology evaluation

## 2016-04-30 ENCOUNTER — Other Ambulatory Visit: Payer: Self-pay | Admitting: Internal Medicine

## 2016-05-01 NOTE — Telephone Encounter (Signed)
faxed

## 2016-05-01 NOTE — Telephone Encounter (Signed)
Done hardcopy to Shirron  

## 2016-06-06 ENCOUNTER — Telehealth: Payer: Self-pay | Admitting: Internal Medicine

## 2016-06-06 NOTE — Telephone Encounter (Signed)
Spoke with patient regarding awv. Patient stated that she would like to have awv on the same day as 6 month f/u. F/U appt scheduled for 09/11/2016, but health coach had no availability on that day. Pt asked if f/u can be r/s. R/S 6 month f/u for 09/13/2016 at 1:45pm pt will see health coach at 1pm on the same day.

## 2016-07-28 DIAGNOSIS — R404 Transient alteration of awareness: Secondary | ICD-10-CM | POA: Diagnosis not present

## 2016-07-28 DIAGNOSIS — R531 Weakness: Secondary | ICD-10-CM | POA: Diagnosis not present

## 2016-07-30 ENCOUNTER — Telehealth: Payer: Self-pay | Admitting: Internal Medicine

## 2016-07-30 ENCOUNTER — Emergency Department (HOSPITAL_COMMUNITY): Payer: Medicare HMO

## 2016-07-30 ENCOUNTER — Encounter (HOSPITAL_COMMUNITY): Payer: Self-pay | Admitting: Emergency Medicine

## 2016-07-30 ENCOUNTER — Inpatient Hospital Stay (HOSPITAL_COMMUNITY)
Admission: EM | Admit: 2016-07-30 | Discharge: 2016-08-04 | DRG: 378 | Disposition: A | Discharge status: Home / Self Care | Payer: Medicare HMO | Attending: Internal Medicine | Admitting: Internal Medicine

## 2016-07-30 DIAGNOSIS — Z7189 Other specified counseling: Secondary | ICD-10-CM

## 2016-07-30 DIAGNOSIS — E785 Hyperlipidemia, unspecified: Secondary | ICD-10-CM | POA: Diagnosis present

## 2016-07-30 DIAGNOSIS — I517 Cardiomegaly: Secondary | ICD-10-CM | POA: Diagnosis not present

## 2016-07-30 DIAGNOSIS — F329 Major depressive disorder, single episode, unspecified: Secondary | ICD-10-CM | POA: Diagnosis present

## 2016-07-30 DIAGNOSIS — Z8249 Family history of ischemic heart disease and other diseases of the circulatory system: Secondary | ICD-10-CM | POA: Diagnosis not present

## 2016-07-30 DIAGNOSIS — C9110 Chronic lymphocytic leukemia of B-cell type not having achieved remission: Secondary | ICD-10-CM | POA: Diagnosis present

## 2016-07-30 DIAGNOSIS — Z87891 Personal history of nicotine dependence: Secondary | ICD-10-CM

## 2016-07-30 DIAGNOSIS — R195 Other fecal abnormalities: Secondary | ICD-10-CM | POA: Diagnosis not present

## 2016-07-30 DIAGNOSIS — Z7982 Long term (current) use of aspirin: Secondary | ICD-10-CM | POA: Diagnosis not present

## 2016-07-30 DIAGNOSIS — K921 Melena: Secondary | ICD-10-CM | POA: Diagnosis present

## 2016-07-30 DIAGNOSIS — G8929 Other chronic pain: Secondary | ICD-10-CM | POA: Diagnosis present

## 2016-07-30 DIAGNOSIS — D649 Anemia, unspecified: Secondary | ICD-10-CM | POA: Diagnosis present

## 2016-07-30 DIAGNOSIS — I129 Hypertensive chronic kidney disease with stage 1 through stage 4 chronic kidney disease, or unspecified chronic kidney disease: Secondary | ICD-10-CM | POA: Diagnosis present

## 2016-07-30 DIAGNOSIS — I421 Obstructive hypertrophic cardiomyopathy: Secondary | ICD-10-CM | POA: Diagnosis present

## 2016-07-30 DIAGNOSIS — K621 Rectal polyp: Secondary | ICD-10-CM | POA: Diagnosis not present

## 2016-07-30 DIAGNOSIS — Z66 Do not resuscitate: Secondary | ICD-10-CM | POA: Diagnosis present

## 2016-07-30 DIAGNOSIS — E039 Hypothyroidism, unspecified: Secondary | ICD-10-CM | POA: Diagnosis present

## 2016-07-30 DIAGNOSIS — Z885 Allergy status to narcotic agent status: Secondary | ICD-10-CM | POA: Diagnosis not present

## 2016-07-30 DIAGNOSIS — K648 Other hemorrhoids: Secondary | ICD-10-CM | POA: Diagnosis present

## 2016-07-30 DIAGNOSIS — E1122 Type 2 diabetes mellitus with diabetic chronic kidney disease: Secondary | ICD-10-CM | POA: Diagnosis present

## 2016-07-30 DIAGNOSIS — I739 Peripheral vascular disease, unspecified: Secondary | ICD-10-CM | POA: Diagnosis not present

## 2016-07-30 DIAGNOSIS — N183 Chronic kidney disease, stage 3 (moderate): Secondary | ICD-10-CM | POA: Diagnosis present

## 2016-07-30 DIAGNOSIS — F32A Depression, unspecified: Secondary | ICD-10-CM | POA: Diagnosis present

## 2016-07-30 DIAGNOSIS — Z9849 Cataract extraction status, unspecified eye: Secondary | ICD-10-CM | POA: Diagnosis not present

## 2016-07-30 DIAGNOSIS — D62 Acute posthemorrhagic anemia: Secondary | ICD-10-CM | POA: Diagnosis not present

## 2016-07-30 DIAGNOSIS — K2970 Gastritis, unspecified, without bleeding: Secondary | ICD-10-CM | POA: Diagnosis not present

## 2016-07-30 DIAGNOSIS — D126 Benign neoplasm of colon, unspecified: Secondary | ICD-10-CM | POA: Diagnosis not present

## 2016-07-30 DIAGNOSIS — D508 Other iron deficiency anemias: Secondary | ICD-10-CM | POA: Diagnosis not present

## 2016-07-30 DIAGNOSIS — Z79899 Other long term (current) drug therapy: Secondary | ICD-10-CM

## 2016-07-30 DIAGNOSIS — I10 Essential (primary) hypertension: Secondary | ICD-10-CM | POA: Diagnosis not present

## 2016-07-30 DIAGNOSIS — R079 Chest pain, unspecified: Secondary | ICD-10-CM | POA: Diagnosis not present

## 2016-07-30 DIAGNOSIS — G4719 Other hypersomnia: Secondary | ICD-10-CM | POA: Diagnosis present

## 2016-07-30 DIAGNOSIS — K635 Polyp of colon: Secondary | ICD-10-CM | POA: Diagnosis not present

## 2016-07-30 DIAGNOSIS — K2951 Unspecified chronic gastritis with bleeding: Secondary | ICD-10-CM | POA: Diagnosis not present

## 2016-07-30 DIAGNOSIS — M810 Age-related osteoporosis without current pathological fracture: Secondary | ICD-10-CM | POA: Diagnosis present

## 2016-07-30 DIAGNOSIS — K219 Gastro-esophageal reflux disease without esophagitis: Secondary | ICD-10-CM | POA: Diagnosis present

## 2016-07-30 DIAGNOSIS — K449 Diaphragmatic hernia without obstruction or gangrene: Secondary | ICD-10-CM | POA: Diagnosis present

## 2016-07-30 DIAGNOSIS — E118 Type 2 diabetes mellitus with unspecified complications: Secondary | ICD-10-CM

## 2016-07-30 DIAGNOSIS — D509 Iron deficiency anemia, unspecified: Secondary | ICD-10-CM | POA: Diagnosis present

## 2016-07-30 DIAGNOSIS — E611 Iron deficiency: Secondary | ICD-10-CM | POA: Diagnosis not present

## 2016-07-30 DIAGNOSIS — Z8673 Personal history of transient ischemic attack (TIA), and cerebral infarction without residual deficits: Secondary | ICD-10-CM

## 2016-07-30 DIAGNOSIS — E1151 Type 2 diabetes mellitus with diabetic peripheral angiopathy without gangrene: Secondary | ICD-10-CM | POA: Diagnosis present

## 2016-07-30 DIAGNOSIS — D500 Iron deficiency anemia secondary to blood loss (chronic): Secondary | ICD-10-CM | POA: Diagnosis not present

## 2016-07-30 DIAGNOSIS — F419 Anxiety disorder, unspecified: Secondary | ICD-10-CM | POA: Diagnosis present

## 2016-07-30 DIAGNOSIS — I350 Nonrheumatic aortic (valve) stenosis: Secondary | ICD-10-CM | POA: Diagnosis not present

## 2016-07-30 DIAGNOSIS — D72820 Lymphocytosis (symptomatic): Secondary | ICD-10-CM | POA: Diagnosis not present

## 2016-07-30 DIAGNOSIS — I371 Nonrheumatic pulmonary valve insufficiency: Secondary | ICD-10-CM | POA: Diagnosis not present

## 2016-07-30 DIAGNOSIS — K5730 Diverticulosis of large intestine without perforation or abscess without bleeding: Secondary | ICD-10-CM | POA: Diagnosis present

## 2016-07-30 DIAGNOSIS — R0989 Other specified symptoms and signs involving the circulatory and respiratory systems: Secondary | ICD-10-CM | POA: Diagnosis present

## 2016-07-30 DIAGNOSIS — R0602 Shortness of breath: Secondary | ICD-10-CM | POA: Diagnosis present

## 2016-07-30 DIAGNOSIS — Z880 Allergy status to penicillin: Secondary | ICD-10-CM

## 2016-07-30 DIAGNOSIS — R5383 Other fatigue: Secondary | ICD-10-CM | POA: Diagnosis present

## 2016-07-30 DIAGNOSIS — M8580 Other specified disorders of bone density and structure, unspecified site: Secondary | ICD-10-CM | POA: Diagnosis present

## 2016-07-30 DIAGNOSIS — K644 Residual hemorrhoidal skin tags: Secondary | ICD-10-CM | POA: Diagnosis present

## 2016-07-30 DIAGNOSIS — E119 Type 2 diabetes mellitus without complications: Secondary | ICD-10-CM

## 2016-07-30 DIAGNOSIS — G4710 Hypersomnia, unspecified: Secondary | ICD-10-CM | POA: Diagnosis present

## 2016-07-30 DIAGNOSIS — C9190 Lymphoid leukemia, unspecified not having achieved remission: Secondary | ICD-10-CM | POA: Diagnosis not present

## 2016-07-30 DIAGNOSIS — Z7984 Long term (current) use of oral hypoglycemic drugs: Secondary | ICD-10-CM

## 2016-07-30 DIAGNOSIS — N184 Chronic kidney disease, stage 4 (severe): Secondary | ICD-10-CM | POA: Diagnosis present

## 2016-07-30 DIAGNOSIS — K922 Gastrointestinal hemorrhage, unspecified: Secondary | ICD-10-CM | POA: Diagnosis present

## 2016-07-30 DIAGNOSIS — M549 Dorsalgia, unspecified: Secondary | ICD-10-CM | POA: Diagnosis present

## 2016-07-30 HISTORY — DX: Cardiomyopathy, unspecified: I42.9

## 2016-07-30 HISTORY — DX: Nonrheumatic aortic (valve) stenosis: I35.0

## 2016-07-30 LAB — I-STAT TROPONIN, ED: Troponin i, poc: 0.02 ng/mL (ref 0.00–0.08)

## 2016-07-30 LAB — URINALYSIS, ROUTINE W REFLEX MICROSCOPIC
BILIRUBIN URINE: NEGATIVE
GLUCOSE, UA: NEGATIVE mg/dL
HGB URINE DIPSTICK: NEGATIVE
KETONES UR: NEGATIVE mg/dL
Leukocytes, UA: NEGATIVE
Nitrite: NEGATIVE
PROTEIN: NEGATIVE mg/dL
Specific Gravity, Urine: 1.012 (ref 1.005–1.030)
pH: 5 (ref 5.0–8.0)

## 2016-07-30 LAB — CBC
HCT: 17.1 % — ABNORMAL LOW (ref 36.0–46.0)
Hemoglobin: 5.3 g/dL — CL (ref 12.0–15.0)
MCH: 29.6 pg (ref 26.0–34.0)
MCHC: 31 g/dL (ref 30.0–36.0)
MCV: 95.5 fL (ref 78.0–100.0)
Platelets: 195 10*3/uL (ref 150–400)
RBC: 1.79 MIL/uL — ABNORMAL LOW (ref 3.87–5.11)
RDW: 14.2 % (ref 11.5–15.5)
WBC: 10.3 10*3/uL (ref 4.0–10.5)

## 2016-07-30 LAB — BASIC METABOLIC PANEL WITH GFR
Anion gap: 10 (ref 5–15)
BUN: 61 mg/dL — ABNORMAL HIGH (ref 6–20)
CO2: 19 mmol/L — ABNORMAL LOW (ref 22–32)
Calcium: 8.3 mg/dL — ABNORMAL LOW (ref 8.9–10.3)
Chloride: 113 mmol/L — ABNORMAL HIGH (ref 101–111)
Creatinine, Ser: 1.32 mg/dL — ABNORMAL HIGH (ref 0.44–1.00)
GFR calc Af Amer: 43 mL/min — ABNORMAL LOW
GFR calc non Af Amer: 37 mL/min — ABNORMAL LOW
Glucose, Bld: 171 mg/dL — ABNORMAL HIGH (ref 65–99)
Potassium: 4.1 mmol/L (ref 3.5–5.1)
Sodium: 142 mmol/L (ref 135–145)

## 2016-07-30 LAB — IRON AND TIBC
Iron: 195 ug/dL — ABNORMAL HIGH (ref 28–170)
Saturation Ratios: 80 % — ABNORMAL HIGH (ref 10.4–31.8)
TIBC: 244 ug/dL — ABNORMAL LOW (ref 250–450)
UIBC: 49 ug/dL

## 2016-07-30 LAB — VITAMIN B12: Vitamin B-12: 532 pg/mL (ref 180–914)

## 2016-07-30 LAB — FOLATE: FOLATE: 58.5 ng/mL (ref >5.9)

## 2016-07-30 LAB — RETICULOCYTES
RBC.: 2.87 MIL/uL — AB (ref 3.87–5.11)
RETIC COUNT ABSOLUTE: 123.4 10*3/uL (ref 19.0–186.0)
Retic Ct Pct: 4.3 % — ABNORMAL HIGH (ref 0.4–3.1)

## 2016-07-30 LAB — PREPARE RBC (CROSSMATCH)

## 2016-07-30 LAB — GLUCOSE, CAPILLARY
Glucose-Capillary: 110 mg/dL — ABNORMAL HIGH (ref 65–99)
Glucose-Capillary: 104 mg/dL — ABNORMAL HIGH (ref 65–99)

## 2016-07-30 LAB — ABO/RH: ABO/RH(D): A NEG

## 2016-07-30 LAB — TROPONIN I: Troponin I: <0.03 ng/mL (ref <0.03)

## 2016-07-30 LAB — FERRITIN: Ferritin: 14 ng/mL (ref 11–307)

## 2016-07-30 MED ORDER — ACETAMINOPHEN 650 MG RE SUPP: 650.0000 mg | Freq: Four times a day (QID) | RECTAL | Status: DC | PRN

## 2016-07-30 MED ORDER — DIAZEPAM 5 MG PO TABS: 2.5000 mg | ORAL_TABLET | Freq: Two times a day (BID) | ORAL | Status: DC | PRN

## 2016-07-30 MED ORDER — SODIUM CHLORIDE 0.9 % IV SOLN: 10.0000 mL/h | Freq: Once | INTRAVENOUS | Status: DC

## 2016-07-30 MED ORDER — PANTOPRAZOLE SODIUM 40 MG IV SOLR
40.0000 mg | Freq: Two times a day (BID) | INTRAVENOUS | Status: DC
  Administered 2016-07-30 – 2016-08-02 (×7): 40 mg via INTRAVENOUS
  Filled 2016-07-30 (×8): qty 40

## 2016-07-30 MED ORDER — SODIUM CHLORIDE 0.9 % IV SOLN: Freq: Once | INTRAVENOUS | Status: DC

## 2016-07-30 MED ORDER — INSULIN ASPART 100 UNIT/ML ~~LOC~~ SOLN
0.0000 [IU] | Freq: Three times a day (TID) | SUBCUTANEOUS | Status: DC
  Administered 2016-07-31 – 2016-08-01 (×2): 2 [IU] via SUBCUTANEOUS
  Administered 2016-08-03: 1 [IU] via SUBCUTANEOUS

## 2016-07-30 MED ORDER — BISACODYL 10 MG RE SUPP: 10.0000 mg | Freq: Every day | RECTAL | Status: DC | PRN

## 2016-07-30 MED ORDER — SODIUM CHLORIDE 0.9 % IV SOLN
80.0000 mg | Freq: Once | INTRAVENOUS | Status: AC
  Administered 2016-07-30: 12:00:00 80 mg via INTRAVENOUS
  Filled 2016-07-30: qty 80

## 2016-07-30 MED ORDER — FERROUS SULFATE 325 (65 FE) MG PO TABS
325.0000 mg | ORAL_TABLET | Freq: Two times a day (BID) | ORAL | Status: DC
  Administered 2016-07-31 – 2016-08-02 (×5): 325 mg via ORAL
  Filled 2016-07-30 (×5): qty 1

## 2016-07-30 MED ORDER — SENNOSIDES-DOCUSATE SODIUM 8.6-50 MG PO TABS
1.0000 | ORAL_TABLET | Freq: Every evening | ORAL | Status: DC | PRN
  Filled 2016-07-30: qty 1

## 2016-07-30 MED ORDER — METHOCARBAMOL 1000 MG/10ML IJ SOLN
500.0000 mg | Freq: Three times a day (TID) | INTRAVENOUS | Status: DC | PRN
  Administered 2016-07-30: 500 mg via INTRAVENOUS
  Filled 2016-07-30: qty 5

## 2016-07-30 MED ORDER — IRBESARTAN 300 MG PO TABS
300.0000 mg | ORAL_TABLET | Freq: Every day | ORAL | Status: DC
  Administered 2016-07-31 – 2016-08-03 (×4): 300 mg via ORAL
  Filled 2016-07-30 (×4): qty 1

## 2016-07-30 MED ORDER — ONDANSETRON HCL 4 MG/2ML IJ SOLN: 4.0000 mg | Freq: Four times a day (QID) | INTRAMUSCULAR | Status: DC | PRN

## 2016-07-30 MED ORDER — ACETAMINOPHEN 10 MG/ML IV SOLN
1000.0000 mg | Freq: Four times a day (QID) | INTRAVENOUS | Status: DC | PRN
  Filled 2016-07-30: qty 100

## 2016-07-30 MED ORDER — METOPROLOL TARTRATE 12.5 MG HALF TABLET
12.5000 mg | ORAL_TABLET | Freq: Every morning | ORAL | Status: DC
  Administered 2016-07-31 – 2016-08-04 (×5): 12.5 mg via ORAL
  Filled 2016-07-30 (×6): qty 1

## 2016-07-30 MED ORDER — AMLODIPINE BESYLATE 5 MG PO TABS
5.0000 mg | ORAL_TABLET | Freq: Every day | ORAL | Status: DC
  Administered 2016-07-31 – 2016-08-04 (×5): 5 mg via ORAL
  Filled 2016-07-30 (×6): qty 1

## 2016-07-30 MED ORDER — ONDANSETRON HCL 4 MG PO TABS: 4.0000 mg | ORAL_TABLET | Freq: Four times a day (QID) | ORAL | Status: DC | PRN

## 2016-07-30 MED ORDER — IRON 325 (65 FE) MG PO TABS: 1.0000 | ORAL_TABLET | Freq: Two times a day (BID) | ORAL | Status: DC

## 2016-07-30 MED ORDER — SODIUM CHLORIDE 0.9 % IV SOLN
INTRAVENOUS | Status: DC
  Administered 2016-07-30: 14:00:00 via INTRAVENOUS

## 2016-07-30 MED ORDER — FUROSEMIDE 20 MG PO TABS: 40.0000 mg | ORAL_TABLET | Freq: Every day | ORAL | Status: DC

## 2016-07-30 MED ORDER — ATORVASTATIN CALCIUM 20 MG PO TABS
20.0000 mg | ORAL_TABLET | Freq: Every day | ORAL | Status: DC
  Administered 2016-07-31 – 2016-08-04 (×5): 20 mg via ORAL
  Filled 2016-07-30 (×6): qty 1

## 2016-07-30 MED ORDER — ACETAMINOPHEN 325 MG PO TABS
650.0000 mg | ORAL_TABLET | Freq: Four times a day (QID) | ORAL | Status: DC | PRN
  Administered 2016-08-02: 650 mg via ORAL
  Filled 2016-07-30: qty 2

## 2016-07-30 NOTE — H&P (Signed)
History and Physical    Norma Smith XBW:[id number] DOB: 1933-05-17 DOA: 07/30/2016   PCP: Biagio Borg, MD   Patient coming from:  Home    Chief Complaint: Chest pain and shortness of breath   HPI: Norma Smith is a 83 y.o. female with medical history significant for CLL on remission, DM, Osteoporosis with chronic back pain ,HTN, HLD, CKD, HCM (followed by Dr. Stanford Breed)  Iron deficiency anemia, prior history of GIB s/p 4 U transfusion of blood about 10 years ago while living in [place], History of colon polyps, Aortic stenosis, presenting to the ED with progressive, 1 month history of exertional shortness of breath, fatigue, left sided abdominal pressure and dark stools. Last evening, her status was complicated by chest pain and shortness of breath at rest, diaphoresis. She also felt dizzy. Of note, 2 days prior while in [facility], she was seen by EMS for similar symptoms but refused transport. On presentation she was very pale. NO LOC, or falls. No confusion.   Denies lower extremity swelling. No unilateral weakness. Denies ETOH, tobacco or recreational drug use. Denies significant amount of NSAIDs. Denies Pica.    ED Course:  BP (!) 118/47   Pulse 77   Temp 98.3 F (36.8 C) (Oral)   Resp (!) 21   SpO2 100%    sodium 142 potassium 4.1  bicarb 19 glucose 171 BUN 61, Cr 1.32  GFR 37 Tn 0.02 WBC 10.3 Hb 5.3 Plt 195  Received 40 mg IV Protonix Receiving 2 Units of blood  LAst 2 D echo 2014, EF 65% . History of Hypertrophic obstructive cardiomyopathy. Ao Stenosis   EKG sinus rhythm Review of Systems:  As per HPI otherwise all other systems reviewed and are negative  Past Medical History:  Diagnosis Date  . Allergy   . Anemia   . Aortic stenosis, moderate 06/16/2011  . Blood transfusion without reported diagnosis 2010?  . CLL (chronic lymphoblastic leukemia) 12/01/2010  . Diabetes mellitus   . Dry skin dermatitis 12/01/2010  . Hyperlipidemia   . Hypertension   . Iron deficiency  12/01/2010  . Monoclonal B-cell lymphocytosis of unknown significance 12/02/2014  . Osteopenia   . Skin cancer   . Stroke [facility])    1999    Past Surgical History:  Procedure Laterality Date  . BREAST BIOPSY  1976  . CATARACT EXTRACTION    . LAPAROSCOPIC APPENDECTOMY N/A 12/13/2012   Procedure: APPENDECTOMY LAPAROSCOPIC;  Surgeon: Odis Hollingshead, MD;  Location: [facility] ORS;  Service: General;  Laterality: N/A;    Social History Social History   Social History  . Marital status: Divorced    Spouse name: N/A  . Number of children: 3  . Years of education: 62   Occupational History  . Retired    Social History Main Topics  . Smoking status: Former Research scientist (life sciences)  . Smokeless tobacco: Never Used     Comment: Stopped smoking 5 yrs. ago.  . Alcohol use No     Comment: Occasional  . Drug use: No  . Sexual activity: Not on file     Comment: divorced, 3 daughters, retired administration   Other Topics Concern  . Not on file   Social History Narrative  . No narrative on file     Allergies  Allergen Reactions  . Oxycodone Other (See Comments)    Visual Hallucinations  . Roxanol [Morphine] Hives and Rash  . Codeine Other (See Comments)    Passed out  . Penicillins Rash  Family History  Problem Relation Age of Onset  . Diabetes Mother   . Diabetes Other   . Cancer Other        breast cancer  . Heart disease Other   . Diabetes Other   . Cancer Maternal Aunt        breast  . Heart disease Maternal Aunt       Prior to Admission medications   Medication Sig Start Date End Date Taking? Authorizing Provider  acetaminophen (TYLENOL) 325 MG tablet Take 650 mg by mouth as needed for mild pain.     [provider]  amLODipine (NORVASC) 5 MG tablet TAKE 1 TABLET EVERY DAY 02/28/16   Biagio Borg, MD  aspirin 81 MG tablet Take 81 mg by mouth daily.      [provider]  atorvastatin (LIPITOR) 20 MG tablet TAKE 1 TABLET (20 MG TOTAL) BY MOUTH DAILY. 02/28/16    Biagio Borg, MD  Calcium Carbonate-Vitamin D (CALCIUM-D) 600-400 MG-UNIT TABS Take by mouth 2 (two) times daily.      [provider]  diazepam (VALIUM) 5 MG tablet TAKE 1/2 TABLET BY MOUTH DAILY AS NEEDED FOR STRESS 05/01/16   Biagio Borg, MD  Ferrous Sulfate (IRON) 325 (65 FE) MG TABS Take by mouth 2 (two) times daily.      [provider]  furosemide (LASIX) 40 MG tablet TAKE 1 TABLET EVERY DAY 02/28/16   Biagio Borg, MD  glucose blood test strip Used to test blood sugar 3 times daily 03/01/15   Biagio Borg, MD  irbesartan (AVAPRO) 300 MG tablet TAKE 1 TABLET AT BEDTIME 02/28/16   Biagio Borg, MD  metFORMIN (GLUCOPHAGE) 1000 MG tablet TAKE 1 TABLET EVERY DAY 02/28/16   Biagio Borg, MD  metoprolol tartrate (LOPRESSOR) 25 MG tablet Take 0.5 tablets (12.5 mg total) by mouth every morning. 05/13/15   Biagio Borg, MD  Multiple Vitamins-Minerals (CENTRUM SILVER PO) Take by mouth daily.      [provider]    Physical Exam:  Vitals:   07/30/16 0947 07/30/16 1000 07/30/16 1045 07/30/16 1130  BP:  126/79 115/62 (!) 118/47  Pulse:  77 81 77  Resp:  (!) 21    Temp: 98.3 F (36.8 C)     TempSrc: Oral     SpO2:  100% 100% 100%   Constitutional: NAD, calm, comfortable, very palenormal ENMT: Mucous membranes are moist, without exudate or lesions  Neck: normal, supple, no masses, no thyromegaly Respiratory: clear to auscultation bilaterally, no wheezing, no crackles. Normal respiratory effort  Cardiovascular: Regular rate and rhythm, 3/6 murmurs with extension to the carotids , rubs or gallops. No extremity edema. 2+ pedal pulses.   Abdomen: Soft, non tender, No hepatosplenomegaly. Bowel sounds positive.  Musculoskeletal: no clubbing / cyanosis. Moves all extremities Skin: no jaundice, No lesions. Pale   Neurologic: Sensation intact  Strength equal in all extremities Psychiatric:   Alert and oriented x 3. Normal mood.     Labs on Admission: I have personally  reviewed following labs and imaging studies  CBC:  Recent Labs Lab 07/30/16 0935  WBC 10.3  HGB 5.3*  HCT 17.1*  MCV 95.5  PLT 191    Basic Metabolic Panel:  Recent Labs Lab 07/30/16 0935  NA 142  K 4.1  CL 113*  CO2 19*  GLUCOSE 171*  BUN 61*  CREATININE 1.32*  CALCIUM 8.3*    GFR: CrCl cannot be calculated (  Unknown ideal weight.).  Liver Function Tests: No results for input(s): AST, ALT, ALKPHOS, BILITOT, PROT, ALBUMIN in the last 168 hours. No results for input(s): LIPASE, AMYLASE in the last 168 hours. No results for input(s): AMMONIA in the last 168 hours.  Coagulation Profile: No results for input(s): INR, PROTIME in the last 168 hours.  Cardiac Enzymes: No results for input(s): CKTOTAL, CKMB, CKMBINDEX, TROPONINI in the last 168 hours.  BNP (last 3 results) No results for input(s): PROBNP in the last 8760 hours.  HbA1C: No results for input(s): HGBA1C in the last 72 hours.  CBG: No results for input(s): GLUCAP in the last 168 hours.  Lipid Profile: No results for input(s): CHOL, HDL, LDLCALC, TRIG, CHOLHDL, LDLDIRECT in the last 72 hours.  Thyroid Function Tests: No results for input(s): TSH, T4TOTAL, FREET4, T3FREE, THYROIDAB in the last 72 hours.  Anemia Panel: No results for input(s): VITAMINB12, FOLATE, FERRITIN, TIBC, IRON, RETICCTPCT in the last 72 hours.  Urine analysis:    Component Value Date/Time   COLORURINE YELLOW 03/08/2016 0918   APPEARANCEUR CLEAR 03/08/2016 0918   LABSPEC 1.015 03/08/2016 0918   PHURINE 5.5 03/08/2016 0918   GLUCOSEU NEGATIVE 03/08/2016 0918   HGBUR MODERATE (A) 03/08/2016 0918   BILIRUBINUR NEGATIVE 03/08/2016 0918   KETONESUR NEGATIVE 03/08/2016 0918   PROTEINUR 100 (A) 12/15/2012 0800   UROBILINOGEN 0.2 03/08/2016 0918   NITRITE NEGATIVE 03/08/2016 0918   LEUKOCYTESUR TRACE (A) 03/08/2016 0918    Sepsis Labs: @LABRCNTIP (procalcitonin:4,lacticidven:4) )No results found for this or any previous  visit (from the past 240 hour(s)).   Radiological Exams on Admission: Dg Chest 2 View  Result Date: 07/30/2016 CLINICAL DATA:  83 year old female with chest pain starting last night. Shortness breath, nausea with exertion. Initial encounter. EXAM: CHEST  2 VIEW COMPARISON:  12/15/2012. FINDINGS: Cardiomegaly. Coronary artery calcifications. Calcified mildly tortuous aorta. Scarring lingula. No infiltrate, congestive heart failure or pneumothorax. No plain film evidence of pulmonary malignancy. No acute osseous abnormality. Degenerative changes acromioclavicular joint. IMPRESSION: Cardiomegaly.  Coronary artery calcification. Calcified tortuous aorta. Scarring lingula. Electronically Signed   By: Genia Del M.D.   On: 07/30/2016 09:59    EKG: Independently reviewed.  Assessment/Plan Active Problems:   Iron deficiency   Colon polyps   Diabetes (HCC)   Hyperlipidemia   Hypertension   Anemia   Osteopenia   CLL (chronic lymphocytic leukemia) (HCC)   Fatigue   Aortic stenosis, moderate   Hypertrophic obstructive cardiomyopathy (HCC)   Bruit   Claudication (HCC)   Depression   Daytime hypersomnolence   Anxiety   Esophageal reflux   Monoclonal B-cell lymphocytosis of unknown significance   CKD (chronic kidney disease) stage 3, GFR 30-59 ml/min   Subclinical hypothyroidism   DNR (do not resuscitate) discussion   Symptomatic anemia likely due to Gastro Intestinal Bleed, in the setting of chronic Iron deficiency anemia   Etiology is unknown at this time; history of colon polys and GIB in the past, requiring 4 units of blood, while in [place]. IVProtonix given.  Baseline Hgb is 10-11 , current Hgb is 5.4 with MCV normal   BUN is 61. Receiving 2 Units of blood at the ED.   Telemetry Obs   IV Protonix bid . Continue to transfuse for Hb less than 8  GI consult , appreciate their input   NPO anticipating endoscopy  No NSAIDs Anemia panel   CLL/ Monoclonal B-cell lymphocytosis of unknown  significance. Current CBC normal WBC and platelets. No acute issues  Can follow up with Oncology as OP   Hypertension BP 124/65   Pulse 78   Temp 98.3 F (36.8 C) (Oral)   Resp 16   SpO2 100%  Controlled Hold BP as patient is symptomatic and due to volume loss . May resume in am   Hyperlipidemia Continue home statins  Hypertrophic cardiomyopathy/ Aortic stenosis EKG SR without ACS, TN 0.02. CXR NAD. Stable cardiomegaly. Had chest pain  Likely due to symptomatic anemia  Serial troponin  If changes in Tn, or chest pain continues after transfusion, then will consult Cardiology for further workup  Type II Diabetes Current blood sugar level is 171 Lab Results  Component Value Date   HGBA1C 5.5 03/08/2016  Hgb A1C Hold home oral diabetic medications.   SSI Chronic kidney disease stage 3    baseline creatinine 1.4    Current Cr 1.32, stable  Lab Results  Component Value Date   CREATININE 1.32 (H) 07/30/2016   CREATININE 1.49 (H) 03/08/2016   CREATININE 1.40 (H) 08/23/2015  Hold diuretics  Repeat CMET in am   GERD, no acute symptoms Continue PPI bid with Protonix   Subclinical hypothyroidism. Most recent TSH 5.75. Not on meds  Can follow with PCP   Chronic back pain/Osteopenia Continue tylenol IV prn, Robaxin 500 mg x1 IV   Anxiety Continue home Valium    DVT prophylaxis:  SCD's   Code Status:    DNR Family Communication:  Discussed with patient Disposition Plan: Expect patient to be discharged to home after condition improves Consults called:    GI per EDP Admission status:Tele  Obs     Abhishek Levesque E, PA-C [facility]   07/30/2016, 11:54 AM

## 2016-07-30 NOTE — ED Notes (Signed)
2 units of blood ready  - per blood bank.

## 2016-07-30 NOTE — Telephone Encounter (Signed)
Patient Name: Norma Smith DOB: [birth year] Initial Comment Caller says, has diarrhea 3-4 last week, she almost passed out last week, now she hurts all over, feels very weak, hard to walk from room to room, chest hurts as well Nurse Assessment Nurse: Vallery Sa, RN, [place] Date/Time (Eastern Time): 07/30/2016 8:04:50 AM Confirm and document reason for call. If symptomatic, describe symptoms. ---Vaughan Basta states she developed chest pain again last night (pain rated as a 6 on the 1 to 10 scale) and diarrhea about 4 days ago (about 2 episodes in the past 24 hours). No severe breathing difficulty. She thinks she developed a low grade fever last night. Alert and responsive. Does the patient have any new or worsening symptoms? ---Yes Will a triage be completed? ---Yes Related visit to physician within the last 2 weeks? ---No Does the PT have any chronic conditions? (i.e. diabetes, asthma, etc.) ---Yes List chronic conditions. ---Diabetes, High Blood Pressure, Heart problems Is this a behavioral health or substance abuse call? ---No Guidelines Guideline Title Affirmed Question Affirmed Notes Chest Pain [1] Chest pain lasts > 5 minutes AND [2] history of heart disease (i.e., heart attack, bypass surgery, angina, angioplasty, CHF; not just a heart murmur) Final Disposition User Call EMS 911 Now Vallery Sa, RN, [place] Disagree/Comply: Comply Connected with Dispatcher [phone]

## 2016-07-30 NOTE — ED Notes (Signed)
Hospitalist at pt's bedside 

## 2016-07-30 NOTE — ED Notes (Signed)
Gave pt water, per Dr. Wilson Singer.

## 2016-07-30 NOTE — ED Triage Notes (Addendum)
Pt reports chest pain that started off last night around midnight; attempted to change positions. In [facility] and went to ER with symptoms but was not admitted bc she wanted to come back home. States when she walks across room she gets very short of breath, nauseated, diaphoretic. Symptoms only with exertion.

## 2016-07-30 NOTE — ED Provider Notes (Signed)
[place] DEPT Provider Note   CSN: [id number] Arrival date & time: 07/30/16  0258   By signing my name below, I, Norma Smith, attest that this documentation has been prepared under the direction and in the presence of Norma Manifold, MD. Electronically Signed: Soijett Smith, ED Scribe. 07/30/16. 10:37 AM.  History   Chief Complaint Chief Complaint  Patient presents with  . Chest Pain    HPI Norma Smith is a 83 y.o. female with a PMHx of CLL, DM, HTN, who presents to the Emergency Department BIB EMS complaining of left sided CP x tightness/squeezing sensation onset last night. Pt reports associated diaphoresis, exertional SOB, fatigue, dizziness, generalized weakness, nausea, left sided abdominal pressure, and subjective bright red blood in stool. Pt has not tried any medications for the relief of her symptoms. She notes that she noticed that her stool was bright red last week and contributes that to eating cherries prior to. She states that she hasn't had any bright red stools since she stopped eating the cherries. She also notes that 2 days ago while in [place], [place], she began to feel lightheaded and was evaluated by EMS with transport to the hospital recommended to which the pt refused transport. Denies any other symptoms. Denies PMHx of gastric ulcers, GI bleeding, or cardiac catherizations. She reports that she has a PMHx of CLL where she was monitored yearly by an oncologist while in [place]. Pt states that she was diagnosed with hypertrophic cardiomyopathy by a cardiologist Dr. Kirk Smith.     The history is provided by the patient and a relative. No language interpreter was used.    Past Medical History:  Diagnosis Date  . Allergy   . Anemia   . Aortic stenosis, moderate 06/16/2011  . Blood transfusion without reported diagnosis 2010?  . CLL (chronic lymphoblastic leukemia) 12/01/2010  . Diabetes mellitus   . Dry skin dermatitis 12/01/2010  . Hyperlipidemia   .  Hypertension   . Iron deficiency 12/01/2010  . Monoclonal B-cell lymphocytosis of unknown significance 12/02/2014  . Osteopenia   . Skin cancer   . Stroke [facility])    1999    Patient Active Problem List   Diagnosis Date Noted  . Subclinical hypothyroidism 03/13/2016  . DNR (do not resuscitate) discussion 03/13/2016  . CKD (chronic kidney disease) stage 3, GFR 30-59 ml/min 08/25/2015  . Monoclonal B-cell lymphocytosis of unknown significance 12/02/2014  . Asymptomatic microscopic hematuria 12/02/2014  . Anemia of chronic kidney failure 12/02/2014  . Peripheral edema 02/03/2013  . Post-menopausal bleeding 02/03/2013  . Esophageal reflux 02/03/2013  . Constipation 02/03/2013  . Anxiety 01/04/2013  . AKI (acute kidney injury) ([place]) 12/15/2012  . Acute fulminating appendicitis with perforation and peritonitis 12/14/2012  . Depression 06/11/2012  . Daytime hypersomnolence 06/11/2012  . Hypertrophic obstructive cardiomyopathy ([place]) 06/20/2011  . Bruit 06/20/2011  . Claudication ([place]) 06/20/2011  . Aortic stenosis, moderate 06/16/2011  . Vertigo 12/02/2010  . Iron deficiency 12/01/2010  . Dry skin dermatitis 12/01/2010  . CLL (chronic lymphocytic leukemia) ([place]) 12/01/2010  . Fatigue 12/01/2010  . Allergy   . Colon polyps   . Diabetes ([place])   . Hyperlipidemia   . Hypertension   . Anemia   . Osteopenia   . Encounter for well adult exam with abnormal findings 11/24/2010    Past Surgical History:  Procedure Laterality Date  . BREAST BIOPSY  1976  . CATARACT EXTRACTION    . LAPAROSCOPIC APPENDECTOMY N/A 12/13/2012   Procedure: APPENDECTOMY LAPAROSCOPIC;  Surgeon: Odis Hollingshead, MD;  Location: [facility];  Service: General;  Laterality: N/A;    OB History    No data available       Home Medications    Prior to Admission medications   Medication Sig Start Date End Date Taking? Authorizing Provider  acetaminophen (TYLENOL) 325 MG tablet Take 650 mg by mouth as needed for  mild pain.     [provider]  amLODipine (NORVASC) 5 MG tablet TAKE 1 TABLET EVERY DAY 02/28/16   Biagio Borg, MD  aspirin 81 MG tablet Take 81 mg by mouth daily.      [provider]  atorvastatin (LIPITOR) 20 MG tablet TAKE 1 TABLET (20 MG TOTAL) BY MOUTH DAILY. 02/28/16   Biagio Borg, MD  Calcium Carbonate-Vitamin D (CALCIUM-D) 600-400 MG-UNIT TABS Take by mouth 2 (two) times daily.      [provider]  diazepam (VALIUM) 5 MG tablet TAKE 1/2 TABLET BY MOUTH DAILY AS NEEDED FOR STRESS 05/01/16   Biagio Borg, MD  Ferrous Sulfate (IRON) 325 (65 FE) MG TABS Take by mouth 2 (two) times daily.      [provider]  furosemide (LASIX) 40 MG tablet TAKE 1 TABLET EVERY DAY 02/28/16   Biagio Borg, MD  glucose blood test strip Used to test blood sugar 3 times daily 03/01/15   Biagio Borg, MD  irbesartan (AVAPRO) 300 MG tablet TAKE 1 TABLET AT BEDTIME 02/28/16   Biagio Borg, MD  metFORMIN (GLUCOPHAGE) 1000 MG tablet TAKE 1 TABLET EVERY DAY 02/28/16   Biagio Borg, MD  metoprolol tartrate (LOPRESSOR) 25 MG tablet Take 0.5 tablets (12.5 mg total) by mouth every morning. 05/13/15   Biagio Borg, MD  Multiple Vitamins-Minerals (CENTRUM SILVER PO) Take by mouth daily.      [provider]    Family History Family History  Problem Relation Age of Onset  . Diabetes Mother   . Diabetes Other   . Cancer Other        breast cancer  . Heart disease Other   . Diabetes Other   . Cancer Maternal Aunt        breast  . Heart disease Maternal Aunt     Social History Social History  Substance Use Topics  . Smoking status: Former Research scientist (life sciences)  . Smokeless tobacco: Never Used     Comment: Stopped smoking 5 yrs. ago.  . Alcohol use No     Comment: Occasional     Allergies   Oxycodone; Roxanol [morphine]; Codeine; and Penicillins   Review of Systems Review of Systems  Constitutional: Positive for diaphoresis and fatigue.  Respiratory: Positive for shortness  of breath (exertional).   Cardiovascular: Positive for chest pain (left sided).  Gastrointestinal: Positive for abdominal pain (left sided), blood in stool (bright red) and nausea.  Neurological: Positive for dizziness and weakness (generalized).     Physical Exam Updated Vital Signs BP 116/62   Pulse 80   Temp 98.3 F (36.8 C) (Oral)   Resp 18   SpO2 100%   Physical Exam  Constitutional: She is oriented to person, place, and time. She appears well-developed and well-nourished. No distress.  HENT:  Head: Normocephalic and atraumatic.  Eyes: EOM are normal.  Neck: Neck supple.  Cardiovascular: Normal rate and regular rhythm.  Exam reveals no gallop and no friction rub.   Murmur heard.  Systolic murmur is present  Heart systolic murmur consistent with  AS.   Pulmonary/Chest: Breath sounds normal. Tachypnea noted. No respiratory distress. She has no wheezes. She has no rales.  Mild tachypnea.  Abdominal: Soft. She exhibits no distension. There is no tenderness.  Musculoskeletal: Normal range of motion.  Neurological: She is alert and oriented to person, place, and time.  Skin: Skin is warm and dry. There is pallor.  Psychiatric: She has a normal mood and affect. Her behavior is normal.  Nursing note and vitals reviewed.    ED Treatments / Results  DIAGNOSTIC STUDIES: Oxygen Saturation is 100% on Ra, nl by my interpretation.    COORDINATION OF CARE: 10:30 AM Discussed treatment plan with pt at bedside which includes labs, EKG, CXR, and pt agreed to plan.  11:30 AM- Consult with hospitalist, who recommends admission to hospital and GI consult.  Labs (all labs ordered are listed, but only abnormal results are displayed) Labs Reviewed  BASIC METABOLIC PANEL - Abnormal; Notable for the following:       Result Value   Chloride 113 (*)    CO2 19 (*)    Glucose, Bld 171 (*)    BUN 61 (*)    Creatinine, Ser 1.32 (*)    Calcium 8.3 (*)    GFR calc non Af Amer 37 (*)    GFR  calc Af Amer 43 (*)    All other components within normal limits  CBC - Abnormal; Notable for the following:    RBC 1.79 (*)    Hemoglobin 5.3 (*)    HCT 17.1 (*)    All other components within normal limits  OCCULT BLOOD X 1 CARD TO LAB, STOOL  I-STAT TROPOININ, ED  TYPE AND SCREEN  PREPARE RBC (CROSSMATCH)    EKG  EKG Interpretation  Date/Time:  Monday July 30 2016 09:40:02 EDT Ventricular Rate:  84 PR Interval:    QRS Duration: 113 QT Interval:  392 QTC Calculation: 464 R Axis:   27 Text Interpretation:  Sinus rhythm Ventricular premature complex  left atrial enlargement Borderline intraventricular conduction delay Repol abnrm suggests ischemia, anterolateral Confirmed by Norma Smith [phone]) on 07/30/2016 11:12:26 AM       Radiology Dg Chest 2 View  Result Date: 07/30/2016 CLINICAL DATA:  83 year old female with chest pain starting last night. Shortness breath, nausea with exertion. Initial encounter. EXAM: CHEST  2 VIEW COMPARISON:  12/15/2012. FINDINGS: Cardiomegaly. Coronary artery calcifications. Calcified mildly tortuous aorta. Scarring lingula. No infiltrate, congestive heart failure or pneumothorax. No plain film evidence of pulmonary malignancy. No acute osseous abnormality. Degenerative changes acromioclavicular joint. IMPRESSION: Cardiomegaly.  Coronary artery calcification. Calcified tortuous aorta. Scarring lingula. Electronically Signed   By: Genia Del M.D.   On: 07/30/2016 09:59    Procedures Procedures (including critical care time)  CRITICAL CARE Performed by: Norma Smith Total critical care time: 35 minutes Critical care time was exclusive of separately billable procedures and treating other patients. Critical care was necessary to treat or prevent imminent or life-threatening deterioration. Critical care was time spent personally by me on the following activities: development of treatment plan with patient and/or surrogate as well as nursing,  discussions with consultants, evaluation of patient's response to treatment, examination of patient, obtaining history from patient or surrogate, ordering and performing treatments and interventions, ordering and review of laboratory studies, ordering and review of radiographic studies, pulse oximetry and re-evaluation of patient's condition.   Medications Ordered in ED Medications - No data to display   Initial Impression / Assessment and Plan /  ED Course  I have reviewed the triage vital signs and the nursing notes.  Pertinent labs & imaging results that were available during my care of the patient were reviewed by me and considered in my medical decision making (see chart for details).     83 year old female with symptomatically anemia.   At this point, I suspect GI bleed is most likely etiology. Last week she noticed dark red stools but attributed this to eating cherries. She has not had any since. She has been having waxing/waning L abdominal pain. Denies any history of peptic ulcer disease. Her BUN is in the 60s with a creatinine of 1.3. She denies melena though.   She has been complaining some chest pain. It is worse with exertion. No known CAD. Hx of hypertrophic obstructive cardiomyopathy. She has a pretty harsh systolic murmur and aortic stenosis was noted on previous ECHO.   She also reports what sounds like a questionable previous diagnosis of CLL? She was never treated for this and it sounds like they were just following her labs at that time. Her other cell lines are normal today.  She does have some other medical problems which may be contributing, but I think the principal problem here is her severe anemia.  Check Hemoccult. Protonix. She is not anticoagulated. Will transfuse 2 units. Admit.   Final Clinical Impressions(s) / ED Diagnoses   Final diagnoses:  Symptomatic anemia    New Prescriptions New Prescriptions   No medications on file   I personally preformed  the services scribed in my presence. The recorded information has been reviewed is accurate. Norma Manifold, MD.     Norma Manifold, MD 08/10/16 [phone]

## 2016-07-30 NOTE — ED Notes (Signed)
CRITICAL VALUE ALERT  Critical Value:  Hgb 5.3  Date & Time Notied:  07/30/16 10:05  Provider Notified: Dr. Wilson Singer  Orders Received/Actions taken:

## 2016-07-31 ENCOUNTER — Encounter (HOSPITAL_COMMUNITY): Payer: Self-pay | Admitting: Physician Assistant

## 2016-07-31 DIAGNOSIS — Z7189 Other specified counseling: Secondary | ICD-10-CM

## 2016-07-31 DIAGNOSIS — C9190 Lymphoid leukemia, unspecified not having achieved remission: Secondary | ICD-10-CM | POA: Diagnosis not present

## 2016-07-31 DIAGNOSIS — I739 Peripheral vascular disease, unspecified: Secondary | ICD-10-CM | POA: Diagnosis not present

## 2016-07-31 DIAGNOSIS — I421 Obstructive hypertrophic cardiomyopathy: Secondary | ICD-10-CM | POA: Diagnosis not present

## 2016-07-31 DIAGNOSIS — D500 Iron deficiency anemia secondary to blood loss (chronic): Secondary | ICD-10-CM

## 2016-07-31 DIAGNOSIS — R0989 Other specified symptoms and signs involving the circulatory and respiratory systems: Secondary | ICD-10-CM | POA: Diagnosis not present

## 2016-07-31 DIAGNOSIS — K635 Polyp of colon: Secondary | ICD-10-CM | POA: Diagnosis not present

## 2016-07-31 DIAGNOSIS — R079 Chest pain, unspecified: Secondary | ICD-10-CM | POA: Diagnosis not present

## 2016-07-31 DIAGNOSIS — E611 Iron deficiency: Secondary | ICD-10-CM | POA: Diagnosis not present

## 2016-07-31 DIAGNOSIS — D649 Anemia, unspecified: Secondary | ICD-10-CM | POA: Diagnosis not present

## 2016-07-31 DIAGNOSIS — I350 Nonrheumatic aortic (valve) stenosis: Secondary | ICD-10-CM | POA: Diagnosis not present

## 2016-07-31 DIAGNOSIS — N183 Chronic kidney disease, stage 3 (moderate): Secondary | ICD-10-CM | POA: Diagnosis not present

## 2016-07-31 LAB — COMPREHENSIVE METABOLIC PANEL
ALK PHOS: 40 U/L (ref 38–126)
ALT: 12 U/L — ABNORMAL LOW (ref 14–54)
ANION GAP: 6 (ref 5–15)
AST: 19 U/L (ref 15–41)
Albumin: 2.6 g/dL — ABNORMAL LOW (ref 3.5–5.0)
BUN: 41 mg/dL — ABNORMAL HIGH (ref 6–20)
CALCIUM: 8.1 mg/dL — AB (ref 8.9–10.3)
CO2: 21 mmol/L — AB (ref 22–32)
Chloride: 118 mmol/L — ABNORMAL HIGH (ref 101–111)
Creatinine, Ser: 1.12 mg/dL — ABNORMAL HIGH (ref 0.44–1.00)
GFR, EST AFRICAN AMERICAN: 52 mL/min — AB (ref >60)
GFR, EST NON AFRICAN AMERICAN: 45 mL/min — AB (ref >60)
Glucose, Bld: 113 mg/dL — ABNORMAL HIGH (ref 65–99)
Potassium: 4 mmol/L (ref 3.5–5.1)
SODIUM: 145 mmol/L (ref 135–145)
TOTAL PROTEIN: 4.9 g/dL — AB (ref 6.5–8.1)
Total Bilirubin: 1.2 mg/dL (ref 0.3–1.2)

## 2016-07-31 LAB — CBC
HCT: 24.1 % — ABNORMAL LOW (ref 36.0–46.0)
HEMOGLOBIN: 8 g/dL — AB (ref 12.0–15.0)
MCH: 29.7 pg (ref 26.0–34.0)
MCHC: 33.2 g/dL (ref 30.0–36.0)
MCV: 89.6 fL (ref 78.0–100.0)
PLATELETS: 166 10*3/uL (ref 150–400)
RBC: 2.69 MIL/uL — AB (ref 3.87–5.11)
RDW: 17.8 % — ABNORMAL HIGH (ref 11.5–15.5)
WBC: 11.2 10*3/uL — AB (ref 4.0–10.5)

## 2016-07-31 LAB — GLUCOSE, CAPILLARY
GLUCOSE-CAPILLARY: 169 mg/dL — AB (ref 65–99)
GLUCOSE-CAPILLARY: 123 mg/dL — AB (ref 65–99)
Glucose-Capillary: 102 mg/dL — ABNORMAL HIGH (ref 65–99)
Glucose-Capillary: 102 mg/dL — ABNORMAL HIGH (ref 65–99)

## 2016-07-31 LAB — PROTIME-INR
INR: 1.06
Prothrombin Time: 13.9 seconds (ref 11.4–15.2)

## 2016-07-31 LAB — TROPONIN I: Troponin I: <0.03 ng/mL (ref <0.03)

## 2016-07-31 NOTE — Consult Note (Signed)
Cardiology Consultation:   Patient ID: Norma Smith; [id number]; Nov 05, 1933   Admit date: 07/30/2016 Date of Consult: 07/31/2016  Primary Care Provider: Biagio Borg, MD Primary Cardiologist: Dr Stanford Breed 06/20/2011 Primary Electrophysiologist: n/a   Patient Profile:   Norma Smith is a 83 y.o. female with a hx of  CLL in remission, DM, Osteoporosis with chronic back pain ,HTN, HLD, CKD III, HCM, Iron deficiency anemia, prior GIB s/p 4 U transfusion of blood about 10 years ago (in [place]), colon polyps, Aortic stenosis Vascular dz (based on exam 2013), DNR. She is being seen today for chest pain and preop evaluation for GIB at the request of Dr Wynetta Emery.  History of Present Illness:   Norma Smith has been doing fairly well. She does things, just more slowly than she used to. She still drives. She is able to mop and vacuum her apartment, but has to take rest breaks.   She occasionally gets palpitations, feels her heart skip. She notices it more when she is lying in bed. She also is aware that her heart beats hard when she is exerting herself.   She has no history of orthostatic dizziness or exertional dizziness. No presyncope. No hx chest pain. She felt some increased DOE at times, not all the time.   Starting 5 days ago, she had reddish diarrhea (thought the color was from eating cherries) and gradually got weaker. 3 days ago, the weakness was worse. She almost passed out, but refused tx to hospital. She was in [place]. Her daughter drove her home. The diarrhea got better but the weakness persisted. She was extremely weak, even walking room-room. She could not sit up for any length of time. She called her MD yesterday and EMS was called.   She got 2 U PRBCs and feels much better. Her stamina is better and she does not feel as SOB w/ exertion.  For the last 6 weeks or so, she has felt a squeezing pain in her chest when she lies down. She is a little SOB with this, no N&V or  diarrhea. 7-9/10, very uncomfortable. She will turn over onto her R side or sit up and symptoms improve. She has never had the CP w/ exertion, just SOB.   She woke up just after midnight on Monday with the CP and was hurting all over. She also had some diarrhea. It was one of her worst CP episodes. She thought it was indigestion but did not take anything. The pain made her SOB but not nauseated. She was a little sweaty. The CP eventually improved and she felt better, was able to sleep.   She continued to have some chest tightness, but no severe. Mainly, she felt very weak. The tightness was worse with exertion.  The tightness resolved after she was transfused.   Past Medical History:  Diagnosis Date  . Allergy   . Anemia   . Aortic stenosis   . Aortic stenosis, moderate 06/16/2011  . Blood transfusion without reported diagnosis 2010?  Marland Kitchen Cardiomyopathy ([place])   . CLL (chronic lymphoblastic leukemia) 12/01/2010  . Diabetes mellitus   . Dry skin dermatitis 12/01/2010  . Hyperlipidemia   . Hypertension   . Iron deficiency 12/01/2010  . Monoclonal B-cell lymphocytosis of unknown significance 12/02/2014  . Osteopenia   . Skin cancer   . Stroke [facility])    1999    Past Surgical History:  Procedure Laterality Date  . APPENDECTOMY    . BREAST BIOPSY  1976  .  CATARACT EXTRACTION    . LAPAROSCOPIC APPENDECTOMY N/A 12/13/2012   Procedure: APPENDECTOMY LAPAROSCOPIC;  Surgeon: Odis Hollingshead, MD;  Location: [facility]S;  Service: General;  Laterality: N/A;     Inpatient Medications: Scheduled Meds: . amLODipine  5 mg Oral Daily  . atorvastatin  20 mg Oral Daily  . ferrous sulfate  325 mg Oral BID WC  . insulin aspart  0-9 Units Subcutaneous TID WC  . irbesartan  300 mg Oral QHS  . metoprolol tartrate  12.5 mg Oral q morning - 10a  . pantoprazole (PROTONIX) IV  40 mg Intravenous BID   Continuous Infusions: . sodium chloride 10 mL/hr at 07/30/16 1420  . methocarbamol (ROBAXIN)  IV 500 mg  (07/30/16 1411)   PRN Meds: acetaminophen **OR** acetaminophen, bisacodyl, diazepam, methocarbamol (ROBAXIN)  IV, ondansetron **OR** ondansetron (ZOFRAN) IV, senna-docusate  Allergies:    Allergies  Allergen Reactions  . Codeine Other (See Comments)    Passed out  . Oxycodone Other (See Comments)    Visual Hallucinations  . Roxanol [Morphine] Hives and Rash  . Penicillins Rash    Has patient had a PCN reaction causing immediate rash, facial/tongue/throat swelling, SOB or lightheadedness with hypotension: Yes Has patient had a PCN reaction causing severe rash involving mucus membranes or skin necrosis: Yes Has patient had a PCN reaction that required hospitalization: No Has patient had a PCN reaction occurring within the last 10 years: No If all of the above answers are "NO", then may proceed with Cephalosporin use.     Social History:   Social History   Social History  . Marital status: Divorced    Spouse name: N/A  . Number of children: 3  . Years of education: 56   Occupational History  . Retired    Social History Main Topics  . Smoking status: Former Research scientist (life sciences)  . Smokeless tobacco: Never Used     Comment: Stopped smoking 5 yrs. ago.  . Alcohol use No     Comment: Occasional  . Drug use: No  . Sexual activity: Not on file     Comment: divorced, 3 daughters, retired administration   Other Topics Concern  . Not on file   Social History Narrative   Lives alone in 3 room apartment.    Family History:   The patient's family history includes Cancer in her maternal aunt and other; Diabetes in her mother, other, and other; Heart disease in her maternal aunt and other.  ROS:  Please see the history of present illness.  All other ROS reviewed and negative.      Physical Exam/Data:   Vitals:   07/30/16 2033 07/31/16 0445 07/31/16 0758 07/31/16 0930  BP: (!) 142/47 (!) 158/61 (!) 150/50 (!) 160/59  Pulse: 79 95 77   Resp: 16 16 16    Temp: 99 F (37.2 C) 98.5 F  (36.9 C) 98.4 F (36.9 C)   TempSrc: Oral Oral Oral   SpO2: 100% 97% 97%   Weight: 147 lb 6.4 oz (66.9 kg)       Intake/Output Summary (Last 24 hours) at 07/31/16 1453 Last data filed at 07/30/16 2130  Gross per 24 hour  Intake            701.5 ml  Output              250 ml  Net            451.5 ml   Filed Weights   07/30/16 2033  Weight: 147 lb 6.4 oz (66.9 kg)   Body mass index is 27.85 kg/m.  General:  Well nourished, well developed female, in no acute distress HEENT: normal Lymph: no adenopathy Neck: no JVD Endocrine:  No thryomegaly Vascular: No carotid bruits but murmur radiates to the carotids; 4/4 extremities pulses 2+ bilaterally Cardiac:  normal S1, S2; RRR; 2-3/6 murmur  Lungs: Essentially clear to auscultation bilaterally, no wheezing, rhonchi or rales  Abd: soft, nontender, no hepatomegaly  Ext: no edema Musculoskeletal:  No deformities, BUE and BLE strength normal and equal Skin: warm and dry  Neuro:  CNs 2-12 intact, no focal abnormalities noted Psych:  Normal affect   EKG:  The EKG was personally reviewed and demonstrates: 07/30/2016 SR, ST depression in V5&6, more so than in 2014, repeat pending Telemetry:  Telemetry was personally reviewed and demonstrates:  SR w/ occ PVCs  Relevant CV Studies: ECHO: 06/26/2012 - Left ventricle: LVOT is narrow at 16 mm. There is systolic anterior motion of the mitral valve leaflets. Peak and mean gradients through LVOT at rest are 25 and 13 mm Hg respectively. With Valsalvathe gradient the gradients are 90 and 53 mm Hg respectively. The cavity size was normal. Wall thickness was increased in a pattern of moderate LVH. Systolic function was vigorous. The estimated ejection fraction was in the range of 65% to 70%. - Aortic valve: AV is thickened, calcified with mildly restricted motion. Peak andmean gradients through the valve at rest are 44 and 23 mm Hg respectively. There is no significant  AI. - Pulmonary arteries: PA peak pressure: 48mm Hg (S).  Laboratory Data:  Chemistry  Recent Labs Lab 07/30/16 0935 07/31/16 0435  NA 142 145  K 4.1 4.0  CL 113* 118*  CO2 19* 21*  GLUCOSE 171* 113*  BUN 61* 41*  CREATININE 1.32* 1.12*  CALCIUM 8.3* 8.1*  GFRNONAA 37* 45*  GFRAA 43* 52*  ANIONGAP 10 6     Recent Labs Lab 07/31/16 0435  PROT 4.9*  ALBUMIN 2.6*  AST 19  ALT 12*  ALKPHOS 40  BILITOT 1.2   Hematology  Recent Labs Lab 07/30/16 0935 07/30/16 2040 07/31/16 0435  WBC 10.3  --  11.2*  RBC 1.79* 2.87* 2.69*  HGB 5.3*  --  8.0*  HCT 17.1*  --  24.1*  MCV 95.5  --  89.6  MCH 29.6  --  29.7  MCHC 31.0  --  33.2  RDW 14.2  --  17.8*  PLT 195  --  166   Cardiac Enzymes  Recent Labs Lab 07/30/16 2040 07/31/16 0032  TROPONINI <0.03 <0.03     Recent Labs Lab 07/30/16 0952  TROPIPOC 0.02    Radiology/Studies:  Dg Chest 2 View  Result Date: 07/30/2016 CLINICAL DATA:  83 year old female with chest pain starting last night. Shortness breath, nausea with exertion. Initial encounter. EXAM: CHEST  2 VIEW COMPARISON:  12/15/2012. FINDINGS: Cardiomegaly. Coronary artery calcifications. Calcified mildly tortuous aorta. Scarring lingula. No infiltrate, congestive heart failure or pneumothorax. No plain film evidence of pulmonary malignancy. No acute osseous abnormality. Degenerative changes acromioclavicular joint. IMPRESSION: Cardiomegaly.  Coronary artery calcification. Calcified tortuous aorta. Scarring lingula. Electronically Signed   By: Genia Del M.D.   On: 07/30/2016 09:59    Assessment and Plan:   1. Chest pain - sx were prolonged without enzyme elevations. - ECG was a little different, will recheck now that sx have resolved - she was not getting exertional sx routinely, not until she became  acutely ill. - last echo was 2014, repeat - HOCM may contribute to CP in the setting of dehydration and anemia - pt asked how aggressive she was  willing to be in her care, regarding stress test and/or cath, she will think about it. She is really not willing to have OHS.  2. HOCM:  - Need to make sure she is hydrated, will ck orthostatic VS - BUN/Cr were higher than usual on admission, follow - will ask tech to ck gradients w/ ValSalva, they were very high in 2014  3. Anemia and other problems below: - per IM  Active Problems:   Iron deficiency   Colon polyps   Diabetes ([facility])   Hyperlipidemia   Hypertension   Anemia   Osteopenia   CLL (chronic lymphocytic leukemia) ([facility])   Fatigue   Aortic stenosis, moderate   Hypertrophic obstructive cardiomyopathy ([facility])   Bruit   Claudication ([facility])   Depression   Daytime hypersomnolence   Anxiety   Esophageal reflux   Monoclonal B-cell lymphocytosis of unknown significance   CKD (chronic kidney disease) stage 3, GFR 30-59 ml/min   Subclinical hypothyroidism   DNR (do not resuscitate) discussion   Symptomatic anemia   Signed, Barrett, Suanne Marker, PA-C  07/31/2016 2:53 PM   I have seen, examined and evaluated the patient this PM along with Rosaria Ferries, PA.  After reviewing all the available data and chart, we discussed the patients laboratory, study & physical findings as well as symptoms in detail. I agree with her findings, examination as well as impression recommendations as per our discussion.    Pt with HOCM & CLL admitted with acute anemia (unsure of etiology) & noted to have CP. I agree that prolonged CP with NEgative Troponin levels is unlikely ACS related - more c/w anemia (in setting of HOCM) mediated endocardial Supply vs. Demand Ischemia. --> Sx improved with transfusion.  Agree with adequate hydration & would prefer Hgb level > 9.  Echo pending - (she does not think that she was asked to do valsalva - may need more detailed evaluation if not done). - can assess EF & wall motion as well as filling pressures.  She additionally has ? Dx of AS - not sure if this is true  AS of related to LVOT gradient -- Echo pending. Keep hydrated.  @ this time, I do not think that any further cardiology evaluation beyond echo -- do not think that a ST is necessary.  We will follow up on Echo results with further rec's tomorrow.    Glenetta Hew, M.D., M.S. Interventional Cardiologist   Pager # [phone] Phone # [phone] [address] [place], [place] [address]

## 2016-07-31 NOTE — Progress Notes (Signed)
PROGRESS NOTE  Norma Smith  ENI:[id number]  DOB: Jul 03, 1933  DOA: 07/30/2016 PCP: Biagio Borg, MD  Brief Admission Hx: 83 y.o. female with medical history significant for CLL on remission, DM, Osteoporosis with chronic back pain ,HTN, HLD, CKD, HCM (followed by Dr. Stanford Breed)  Iron deficiency anemia, prior history of GIB s/p 4 U transfusion of blood about 10 years ago while living in [place], History of colon polyps, Aortic stenosis, presenting to the ED with progressive, 1 month history of exertional shortness of breath, fatigue, left sided abdominal pressure and dark stools.  MDM/Assessment & Plan:    Symptomatic anemia likely due to Gastro Intestinal Bleed, in the setting of chronic Iron deficiency anemia   Etiology is unknown at this time; history of colon polys and GIB in the past, requiring 4 units of blood, while in [place]. IVProtonix given.  Baseline Hgb is 10-11 , current Hgb is 5.4 with MCV normal   BUN is 61. Receiving 2 Units of blood at the ED.   Telemetry Obs   IV Protonix bid. Continue to transfuse for Hb less than 8  GI consulted:  Will get cardiology clearance for endoscopies  Heart healthy diet for now ( I discussed with Dr. Paulita Fujita )  No NSAIDs Anemia panel   CLL/ Monoclonal B-cell lymphocytosis of unknown significance. Current CBC normal WBC and platelets. No acute issues Can follow up with Oncology as OP  Hypertension BP 124/65   Pulse 78   Temp 98.3 F (36.8 C) (Oral)   Resp 16   SpO2 100%  Controlled Hold BP as patient is symptomatic and due to volume loss.  May resume in am   Hyperlipidemia Continue home statins  Hypertrophic cardiomyopathy/ Aortic stenosis EKG SR without ACS, TN 0.02. CXR NAD. Stable cardiomegaly. Had chest pain  Likely due to symptomatic anemia  Serial troponin  If changes in Tn, or chest pain continues after transfusion, then will consult Cardiology for further workup  Type II Diabetes Current blood sugar level is 171 Recent  Labs       Lab Results  Component Value Date   HGBA1C 5.5 03/08/2016    Hgb A1C Hold home oral diabetic medications.   SSI Hold diuretics  Repeat CMET in am    GERD, no acute symptoms Continue PPI bid with Protonix   Subclinical hypothyroidism. Most recent TSH 5.75. Not on meds  Can follow with PCP   Chronic back pain/Osteopenia Continue tylenol IV prn, Robaxin 500 mg x1 IV   Anxiety Continue home Valium  DVT prophylaxis:  SCD's   Code Status:    DNR Family Communication:  Discussed with patient Disposition Plan: Expect patient to be discharged to home after condition improves Consults called:    GI per EDP Admission status:Tele  Obs    Consultants:  GI  Subjective: Pt says she is starving, she really wants to eat and drink.   Objective: Vitals:   07/30/16 2033 07/31/16 0445 07/31/16 0758 07/31/16 0930  BP: (!) 142/47 (!) 158/61 (!) 150/50 (!) 160/59  Pulse: 79 95 77   Resp: 16 16 16    Temp: 99 F (37.2 C) 98.5 F (36.9 C) 98.4 F (36.9 C)   TempSrc: Oral Oral Oral   SpO2: 100% 97% 97%   Weight: 66.9 kg (147 lb 6.4 oz)       Intake/Output Summary (Last 24 hours) at 07/31/16 1113 Last data filed at 07/30/16 2130  Gross per 24 hour  Intake  701.5 ml  Output              250 ml  Net            451.5 ml   Filed Weights   07/30/16 2033  Weight: 66.9 kg (147 lb 6.4 oz)    REVIEW OF SYSTEMS  As per history otherwise all reviewed and reported negative  Exam:  General exam: awake, alert, NAD, appears very pale Respiratory system: Clear. No increased work of breathing. Cardiovascular system: S1 & S2 heard, RRR.  Gastrointestinal system: Abdomen is nondistended, soft and nontender. Normal bowel sounds heard. Central nervous system: Alert and oriented. No focal neurological deficits. Extremities: no cyanosis.  Data Reviewed: Basic Metabolic Panel:  Recent Labs Lab 07/30/16 0935 07/31/16 0435  NA 142 145  K 4.1 4.0  CL 113*  118*  CO2 19* 21*  GLUCOSE 171* 113*  BUN 61* 41*  CREATININE 1.32* 1.12*  CALCIUM 8.3* 8.1*   Liver Function Tests:  Recent Labs Lab 07/31/16 0435  AST 19  ALT 12*  ALKPHOS 40  BILITOT 1.2  PROT 4.9*  ALBUMIN 2.6*   No results for input(s): LIPASE, AMYLASE in the last 168 hours. No results for input(s): AMMONIA in the last 168 hours. CBC:  Recent Labs Lab 07/30/16 0935 07/31/16 0435  WBC 10.3 11.2*  HGB 5.3* 8.0*  HCT 17.1* 24.1*  MCV 95.5 89.6  PLT 195 166   Cardiac Enzymes:  Recent Labs Lab 07/30/16 2040 07/31/16 0032  TROPONINI <0.03 <0.03   CBG (last 3)   Recent Labs  07/30/16 1702 07/30/16 2128 07/31/16 0753  GLUCAP 110* 104* 102*   No results found for this or any previous visit (from the past 240 hour(s)).   Studies: Dg Chest 2 View  Result Date: 07/30/2016 CLINICAL DATA:  83 year old female with chest pain starting last night. Shortness breath, nausea with exertion. Initial encounter. EXAM: CHEST  2 VIEW COMPARISON:  12/15/2012. FINDINGS: Cardiomegaly. Coronary artery calcifications. Calcified mildly tortuous aorta. Scarring lingula. No infiltrate, congestive heart failure or pneumothorax. No plain film evidence of pulmonary malignancy. No acute osseous abnormality. Degenerative changes acromioclavicular joint. IMPRESSION: Cardiomegaly.  Coronary artery calcification. Calcified tortuous aorta. Scarring lingula. Electronically Signed   By: Genia Del M.D.   On: 07/30/2016 09:59   Scheduled Meds: . amLODipine  5 mg Oral Daily  . atorvastatin  20 mg Oral Daily  . ferrous sulfate  325 mg Oral BID WC  . insulin aspart  0-9 Units Subcutaneous TID WC  . irbesartan  300 mg Oral QHS  . metoprolol tartrate  12.5 mg Oral q morning - 10a  . pantoprazole (PROTONIX) IV  40 mg Intravenous BID   Continuous Infusions: . sodium chloride 10 mL/hr at 07/30/16 1420  . acetaminophen    . methocarbamol (ROBAXIN)  IV 500 mg (07/30/16 1411)   Active  Problems:   Iron deficiency   Colon polyps   Diabetes (HCC)   Hyperlipidemia   Hypertension   Anemia   Osteopenia   CLL (chronic lymphocytic leukemia) (HCC)   Fatigue   Aortic stenosis, moderate   Hypertrophic obstructive cardiomyopathy (HCC)   Bruit   Claudication (HCC)   Depression   Daytime hypersomnolence   Anxiety   Esophageal reflux   Monoclonal B-cell lymphocytosis of unknown significance   CKD (chronic kidney disease) stage 3, GFR 30-59 ml/min   Subclinical hypothyroidism   DNR (do not resuscitate) discussion   Symptomatic anemia  Time spent:  Irwin Brakeman, MD, FAAFP [facility] Pager [phone]  If 7PM-7AM, please contact night-coverage [email or web address] Password [email or web address] 07/31/2016, 11:13 AM    LOS: 0 days

## 2016-07-31 NOTE — Consult Note (Signed)
[facility] Gastroenterology Consultation Note  Referring Provider: Dr Irwin Brakeman [facility]) Primary Care Physician:  Biagio Borg, MD  Reason for Consultation:  anemia  HPI: Norma Smith is a 83 y.o. female presenting with several days' of chest pain, dyspnea, weakness.  Found to be anemic with normal MCV.  One dark red stool after eating cherries, but no other obvious overt GI bleeding.  Some mild abdominal pressure.  5-10 lb weight loss over past few weeks.  No constipation or obstipation.  No change in bowel habits.  Endoscopy and colonoscopy elsewhere years' ago for anemia in [place], had colonic polyp otherwise unrevealing per patient.   Past Medical History:  Diagnosis Date  . Allergy   . Anemia   . Aortic stenosis   . Aortic stenosis, moderate 06/16/2011  . Blood transfusion without reported diagnosis 2010?  Marland Kitchen Cardiomyopathy ([place])   . CLL (chronic lymphoblastic leukemia) 12/01/2010  . Diabetes mellitus   . Dry skin dermatitis 12/01/2010  . Hyperlipidemia   . Hypertension   . Iron deficiency 12/01/2010  . Monoclonal B-cell lymphocytosis of unknown significance 12/02/2014  . Osteopenia   . Skin cancer   . Stroke [facility])    1999    Past Surgical History:  Procedure Laterality Date  . APPENDECTOMY    . BREAST BIOPSY  1976  . CATARACT EXTRACTION    . LAPAROSCOPIC APPENDECTOMY N/A 12/13/2012   Procedure: APPENDECTOMY LAPAROSCOPIC;  Surgeon: Odis Hollingshead, MD;  Location: [facility] ORS;  Service: General;  Laterality: N/A;    Prior to Admission medications   Medication Sig Start Date End Date Taking? Authorizing Provider  amLODipine (NORVASC) 5 MG tablet TAKE 1 TABLET EVERY DAY 02/28/16  Yes Biagio Borg, MD  aspirin 81 MG tablet Take 81 mg by mouth daily.     Yes [provider]  atorvastatin (LIPITOR) 20 MG tablet TAKE 1 TABLET (20 MG TOTAL) BY MOUTH DAILY. 02/28/16  Yes Biagio Borg, MD  Calcium Carbonate-Vitamin D (CALCIUM-D) 600-400 MG-UNIT TABS Take by mouth 2 (two)  times daily.     Yes [provider]  diazepam (VALIUM) 5 MG tablet TAKE 1/2 TABLET BY MOUTH DAILY AS NEEDED FOR STRESS Patient taking differently: TAKE 1/2 TABLET (2.5MG )  BY MOUTH DAILY ON Elby Beck AND SUNDAY 05/01/16  Yes Biagio Borg, MD  Ferrous Sulfate (IRON) 325 (65 FE) MG TABS Take 1 tablet by mouth 2 (two) times daily.    Yes [provider]  irbesartan (AVAPRO) 300 MG tablet TAKE 1 TABLET AT BEDTIME 02/28/16  Yes Biagio Borg, MD  metFORMIN (GLUCOPHAGE) 1000 MG tablet TAKE 1 TABLET EVERY DAY 02/28/16  Yes Biagio Borg, MD  metoprolol tartrate (LOPRESSOR) 25 MG tablet Take 0.5 tablets (12.5 mg total) by mouth every morning. 05/13/15  Yes Biagio Borg, MD  Multiple Vitamins-Minerals (CENTRUM SILVER PO) Take 1 tablet by mouth daily.    Yes [provider]  pioglitazone (ACTOS) 15 MG tablet Take 15 mg by mouth daily.   Yes [provider]  glucose blood test strip Used to test blood sugar 3 times daily Patient not taking: Reported on 07/30/2016 03/01/15   Biagio Borg, MD    Current Facility-Administered Medications  Medication Dose Route Frequency Provider Last Rate Last Dose  . 0.9 %  sodium chloride infusion   Intravenous Continuous Rondel Jumbo, PA-C 10 mL/hr at 07/30/16 1420    . acetaminophen (TYLENOL) tablet 650 mg  650 mg Oral Q6H  PRN Rondel Jumbo, PA-C       Or  . acetaminophen (TYLENOL) suppository 650 mg  650 mg Rectal Q6H PRN Rondel Jumbo, PA-C      . amLODipine (NORVASC) tablet 5 mg  5 mg Oral Daily Rondel Jumbo, PA-C   5 mg at 07/31/16 0936  . atorvastatin (LIPITOR) tablet 20 mg  20 mg Oral Daily Rondel Jumbo, PA-C   20 mg at 07/31/16 6294  . bisacodyl (DULCOLAX) suppository 10 mg  10 mg Rectal Daily PRN Rondel Jumbo, PA-C      . diazepam (VALIUM) tablet 2.5 mg  2.5 mg Oral Q12H PRN Rondel Jumbo, PA-C      . ferrous sulfate tablet 325 mg  325 mg Oral BID WC Rondel Jumbo, PA-C   325 mg at 07/31/16 7654   . insulin aspart (novoLOG) injection 0-9 Units  0-9 Units Subcutaneous TID WC Wertman, Sara E, PA-C      . irbesartan (AVAPRO) tablet 300 mg  300 mg Oral QHS Wertman, Sara E, PA-C      . methocarbamol (ROBAXIN) 500 mg in dextrose 5 % 50 mL IVPB  500 mg Intravenous Q8H PRN Rondel Jumbo, PA-C 110 mL/hr at 07/30/16 1411 500 mg at 07/30/16 1411  . metoprolol tartrate (LOPRESSOR) tablet 12.5 mg  12.5 mg Oral q morning - 10a Rondel Jumbo, PA-C   12.5 mg at 07/31/16 6503  . ondansetron (ZOFRAN) tablet 4 mg  4 mg Oral Q6H PRN Rondel Jumbo, PA-C       Or  . ondansetron (ZOFRAN) injection 4 mg  4 mg Intravenous Q6H PRN Rondel Jumbo, PA-C      . pantoprazole (PROTONIX) injection 40 mg  40 mg Intravenous BID Rondel Jumbo, PA-C   40 mg at 07/31/16 0936  . senna-docusate (Senokot-S) tablet 1 tablet  1 tablet Oral QHS PRN Rondel Jumbo, PA-C        Allergies as of 07/30/2016 - Review Complete 07/30/2016  Allergen Reaction Noted  . Codeine Other (See Comments) 12/01/2010  . Oxycodone Other (See Comments) 12/20/2012  . Roxanol [morphine] Hives and Rash 12/20/2012  . Penicillins Rash 12/01/2010    Family History  Problem Relation Age of Onset  . Diabetes Mother   . Diabetes Other   . Cancer Other        breast cancer  . Heart disease Other   . Diabetes Other   . Cancer Maternal Aunt        breast  . Heart disease Maternal Aunt     Social History   Social History  . Marital status: Divorced    Spouse name: N/A  . Number of children: 3  . Years of education: 75   Occupational History  . Retired    Social History Main Topics  . Smoking status: Former Research scientist (life sciences)  . Smokeless tobacco: Never Used     Comment: Stopped smoking 5 yrs. ago.  . Alcohol use No     Comment: Occasional  . Drug use: No  . Sexual activity: Not on file     Comment: divorced, 3 daughters, retired administration   Other Topics Concern  . Not on file   Social History Narrative  . No narrative on  file    Review of Systems: Positive = bold Gen: Denies any fever, chills, rigors, night sweats, anorexia, fatigue, weakness, malaise, involuntary weight loss, and sleep disorder CV: Denies chest pain, angina, palpitations,  syncope, orthopnea, PND, peripheral edema, and claudication. Resp: Denies dyspnea, cough, sputum, wheezing, coughing up blood. GI: Described in detail in HPI.    GU : Denies urinary burning, blood in urine, urinary frequency, urinary hesitancy, nocturnal urination, and urinary incontinence. MS: Denies joint pain or swelling.  Denies muscle weakness, cramps, atrophy.  Derm: Denies rash, itching, oral ulcerations, hives, unhealing ulcers.  Psych: Denies depression, anxiety, memory loss, suicidal ideation, hallucinations,  and confusion. Heme: Denies bruising, bleeding, and enlarged lymph nodes. Neuro:  Denies any headaches, dizziness, paresthesias. Endo:  Denies any problems with DM, thyroid, adrenal function.  Physical Exam: Vital signs in last 24 hours: Temp:  [97.4 F (36.3 C)-99 F (37.2 C)] 98.4 F (36.9 C) (07/10 0758) Pulse Rate:  [74-95] 77 (07/10 0758) Resp:  [16-19] 16 (07/10 0758) BP: (117-162)/(37-61) 160/59 (07/10 0930) SpO2:  [97 %-100 %] 97 % (07/10 0758) Weight:  [66.9 kg (147 lb 6.4 oz)] 66.9 kg (147 lb 6.4 oz) (07/09 2033) Last BM Date: 07/30/16 General:   Alert, pale, Well-developed, elderly, well-nourished, pleasant and cooperative in NAD Head:  Normocephalic and atraumatic. Eyes:  Sclera clear, no icterus.   Conjunctiva pale Ears:  Normal auditory acuity. Nose:  No deformity, discharge,  or lesions. Mouth:  No deformity or lesions.  Oropharynx dry. Neck:  Supple; no masses or thyromegaly. Lungs:  Clear throughout to auscultation.   No wheezes, crackles, or rhonchi. No acute distress. Heart:  Regular rate and rhythm; IV/VI SEM LUSB harsh Abdomen:  Soft, nontender, mild distended.  No masses, hepatosplenomegaly or hernias noted. Normal bowel  sounds, without guarding, and without rebound.     Msk:  Symmetrical without gross deformities. Normal posture. Pulses:  Normal pulses noted. Extremities:  Without clubbing or edema. Neurologic:  Alert and  oriented x4; diffusely weak, otherwise grossly normal neurologically. Skin:  Pale, otherwise intact without significant lesions or rashes. Cervical Nodes:  No significant cervical adenopathy. Psych:  Alert and cooperative. Normal mood and affect.   Lab Results:  Recent Labs  07/30/16 0935 07/31/16 0435  WBC 10.3 11.2*  HGB 5.3* 8.0*  HCT 17.1* 24.1*  PLT 195 166   BMET  Recent Labs  07/30/16 0935 07/31/16 0435  NA 142 145  K 4.1 4.0  CL 113* 118*  CO2 19* 21*  GLUCOSE 171* 113*  BUN 61* 41*  CREATININE 1.32* 1.12*  CALCIUM 8.3* 8.1*   LFT  Recent Labs  07/31/16 0435  PROT 4.9*  ALBUMIN 2.6*  AST 19  ALT 12*  ALKPHOS 40  BILITOT 1.2   PT/INR  Recent Labs  07/31/16 0435  LABPROT 13.9  INR 1.06    Studies/Results: Dg Chest 2 View  Result Date: 07/30/2016 CLINICAL DATA:  83 year old female with chest pain starting last night. Shortness breath, nausea with exertion. Initial encounter. EXAM: CHEST  2 VIEW COMPARISON:  12/15/2012. FINDINGS: Cardiomegaly. Coronary artery calcifications. Calcified mildly tortuous aorta. Scarring lingula. No infiltrate, congestive heart failure or pneumothorax. No plain film evidence of pulmonary malignancy. No acute osseous abnormality. Degenerative changes acromioclavicular joint. IMPRESSION: Cardiomegaly.  Coronary artery calcification. Calcified tortuous aorta. Scarring lingula. Electronically Signed   By: Genia Del M.D.   On: 07/30/2016 09:59   Impression:  1.  Anemia without overt GI bleeding.  Unclear how much of anemia is GI-related, as her CLL and valvular disease and chronic comorbidities could also lead to normocytic anemia. 2.  Multiple medical problems, including aortic stenosis, CLL, hypertrophic  cardiomyopathy.  Plan:  1.  Prior to consideration of any endoscopic evaluation, patient needs cardiology consultation for (A) consideration of any pre-procedural testing or intervention specifically as regards her previously viewed moderate aortic stenosis and (B) clearance to perform endoscopy and colonoscopy with anesthesia-administered propofol.  Suspect patient at the very least will need updated echocardiogram, but defer all this to cardiologists. 2.  Once cardiology testing and clearance completed, would plan to pursue endoscopy and colonoscopy as inpatient pre-discharge. If no source seen on egd/colon, consider hematology evaluation. 3.  Heart healthy diet today ok. 4.  [facility] will follow.   LOS: 0 days   Norma Smith M  07/31/2016, 12:11 PM  Pager [phone] If no answer or after 5 PM call [phone]

## 2016-07-31 NOTE — Care Management Obs Status (Signed)
[place] NOTIFICATION   Patient Details  Name: Norma Smith MRN: [id number] Date of Birth: 04-Sep-1933   Medicare Observation Status Notification Given:  Yes    Ruffus Kamaka, Rory Percy, RN 07/31/2016, 11:49 AM

## 2016-08-01 ENCOUNTER — Observation Stay (HOSPITAL_COMMUNITY): Payer: Medicare HMO

## 2016-08-01 DIAGNOSIS — I421 Obstructive hypertrophic cardiomyopathy: Secondary | ICD-10-CM | POA: Diagnosis present

## 2016-08-01 DIAGNOSIS — F419 Anxiety disorder, unspecified: Secondary | ICD-10-CM | POA: Diagnosis present

## 2016-08-01 DIAGNOSIS — Z79899 Other long term (current) drug therapy: Secondary | ICD-10-CM | POA: Diagnosis not present

## 2016-08-01 DIAGNOSIS — I10 Essential (primary) hypertension: Secondary | ICD-10-CM | POA: Diagnosis not present

## 2016-08-01 DIAGNOSIS — C9190 Lymphoid leukemia, unspecified not having achieved remission: Secondary | ICD-10-CM | POA: Diagnosis not present

## 2016-08-01 DIAGNOSIS — C9110 Chronic lymphocytic leukemia of B-cell type not having achieved remission: Secondary | ICD-10-CM | POA: Diagnosis present

## 2016-08-01 DIAGNOSIS — Z7982 Long term (current) use of aspirin: Secondary | ICD-10-CM | POA: Diagnosis not present

## 2016-08-01 DIAGNOSIS — F329 Major depressive disorder, single episode, unspecified: Secondary | ICD-10-CM | POA: Diagnosis present

## 2016-08-01 DIAGNOSIS — I129 Hypertensive chronic kidney disease with stage 1 through stage 4 chronic kidney disease, or unspecified chronic kidney disease: Secondary | ICD-10-CM | POA: Diagnosis present

## 2016-08-01 DIAGNOSIS — Z885 Allergy status to narcotic agent status: Secondary | ICD-10-CM | POA: Diagnosis not present

## 2016-08-01 DIAGNOSIS — Z9849 Cataract extraction status, unspecified eye: Secondary | ICD-10-CM | POA: Diagnosis not present

## 2016-08-01 DIAGNOSIS — Z87891 Personal history of nicotine dependence: Secondary | ICD-10-CM | POA: Diagnosis not present

## 2016-08-01 DIAGNOSIS — Z8249 Family history of ischemic heart disease and other diseases of the circulatory system: Secondary | ICD-10-CM | POA: Diagnosis not present

## 2016-08-01 DIAGNOSIS — I371 Nonrheumatic pulmonary valve insufficiency: Secondary | ICD-10-CM | POA: Diagnosis not present

## 2016-08-01 DIAGNOSIS — Z7984 Long term (current) use of oral hypoglycemic drugs: Secondary | ICD-10-CM | POA: Diagnosis not present

## 2016-08-01 DIAGNOSIS — R0602 Shortness of breath: Secondary | ICD-10-CM | POA: Diagnosis present

## 2016-08-01 DIAGNOSIS — E039 Hypothyroidism, unspecified: Secondary | ICD-10-CM | POA: Diagnosis not present

## 2016-08-01 DIAGNOSIS — Z880 Allergy status to penicillin: Secondary | ICD-10-CM | POA: Diagnosis not present

## 2016-08-01 DIAGNOSIS — N183 Chronic kidney disease, stage 3 (moderate): Secondary | ICD-10-CM | POA: Diagnosis present

## 2016-08-01 DIAGNOSIS — I739 Peripheral vascular disease, unspecified: Secondary | ICD-10-CM

## 2016-08-01 DIAGNOSIS — E611 Iron deficiency: Secondary | ICD-10-CM | POA: Diagnosis not present

## 2016-08-01 DIAGNOSIS — D509 Iron deficiency anemia, unspecified: Secondary | ICD-10-CM | POA: Diagnosis present

## 2016-08-01 DIAGNOSIS — R0989 Other specified symptoms and signs involving the circulatory and respiratory systems: Secondary | ICD-10-CM | POA: Diagnosis not present

## 2016-08-01 DIAGNOSIS — Z8673 Personal history of transient ischemic attack (TIA), and cerebral infarction without residual deficits: Secondary | ICD-10-CM | POA: Diagnosis not present

## 2016-08-01 DIAGNOSIS — K922 Gastrointestinal hemorrhage, unspecified: Secondary | ICD-10-CM | POA: Diagnosis present

## 2016-08-01 DIAGNOSIS — Z66 Do not resuscitate: Secondary | ICD-10-CM | POA: Diagnosis present

## 2016-08-01 DIAGNOSIS — I350 Nonrheumatic aortic (valve) stenosis: Secondary | ICD-10-CM | POA: Diagnosis not present

## 2016-08-01 DIAGNOSIS — D508 Other iron deficiency anemias: Secondary | ICD-10-CM | POA: Diagnosis not present

## 2016-08-01 DIAGNOSIS — E785 Hyperlipidemia, unspecified: Secondary | ICD-10-CM | POA: Diagnosis present

## 2016-08-01 DIAGNOSIS — R079 Chest pain, unspecified: Secondary | ICD-10-CM | POA: Diagnosis not present

## 2016-08-01 DIAGNOSIS — E1151 Type 2 diabetes mellitus with diabetic peripheral angiopathy without gangrene: Secondary | ICD-10-CM | POA: Diagnosis present

## 2016-08-01 DIAGNOSIS — K921 Melena: Secondary | ICD-10-CM | POA: Diagnosis present

## 2016-08-01 DIAGNOSIS — M810 Age-related osteoporosis without current pathological fracture: Secondary | ICD-10-CM | POA: Diagnosis present

## 2016-08-01 DIAGNOSIS — D649 Anemia, unspecified: Secondary | ICD-10-CM | POA: Diagnosis present

## 2016-08-01 DIAGNOSIS — K219 Gastro-esophageal reflux disease without esophagitis: Secondary | ICD-10-CM | POA: Diagnosis present

## 2016-08-01 DIAGNOSIS — E118 Type 2 diabetes mellitus with unspecified complications: Secondary | ICD-10-CM | POA: Diagnosis not present

## 2016-08-01 DIAGNOSIS — E1122 Type 2 diabetes mellitus with diabetic chronic kidney disease: Secondary | ICD-10-CM | POA: Diagnosis present

## 2016-08-01 LAB — CBC
HCT: 22.8 % — ABNORMAL LOW (ref 36.0–46.0)
Hemoglobin: 7.2 g/dL — ABNORMAL LOW (ref 12.0–15.0)
MCH: 29.5 pg (ref 26.0–34.0)
MCHC: 31.6 g/dL (ref 30.0–36.0)
MCV: 93.4 fL (ref 78.0–100.0)
Platelets: 172 K/uL (ref 150–400)
RBC: 2.44 MIL/uL — ABNORMAL LOW (ref 3.87–5.11)
RDW: 17.9 % — ABNORMAL HIGH (ref 11.5–15.5)
WBC: 8.6 K/uL (ref 4.0–10.5)

## 2016-08-01 LAB — BASIC METABOLIC PANEL
ANION GAP: 4 — AB (ref 5–15)
BUN: 35 mg/dL — ABNORMAL HIGH (ref 6–20)
CO2: 20 mmol/L — AB (ref 22–32)
Calcium: 7.9 mg/dL — ABNORMAL LOW (ref 8.9–10.3)
Chloride: 116 mmol/L — ABNORMAL HIGH (ref 101–111)
Creatinine, Ser: 1.15 mg/dL — ABNORMAL HIGH (ref 0.44–1.00)
GFR calc non Af Amer: 44 mL/min — ABNORMAL LOW (ref >60)
GFR, EST AFRICAN AMERICAN: 51 mL/min — AB (ref >60)
GLUCOSE: 238 mg/dL — AB (ref 65–99)
POTASSIUM: 4 mmol/L (ref 3.5–5.1)
Sodium: 140 mmol/L (ref 135–145)

## 2016-08-01 LAB — GLUCOSE, CAPILLARY
GLUCOSE-CAPILLARY: 110 mg/dL — AB (ref 65–99)
Glucose-Capillary: 161 mg/dL — ABNORMAL HIGH (ref 65–99)
Glucose-Capillary: 77 mg/dL (ref 65–99)
Glucose-Capillary: 139 mg/dL — ABNORMAL HIGH (ref 65–99)

## 2016-08-01 LAB — HEMOGLOBIN A1C
HEMOGLOBIN A1C: 5.3 % (ref 4.8–5.6)
MEAN PLASMA GLUCOSE: 105 mg/dL

## 2016-08-01 LAB — PREPARE RBC (CROSSMATCH)

## 2016-08-01 MED ORDER — FUROSEMIDE 10 MG/ML IJ SOLN: 20.0000 mg | Freq: Once | INTRAMUSCULAR | Status: DC

## 2016-08-01 MED ORDER — SODIUM CHLORIDE 0.9 % IV SOLN
Freq: Once | INTRAVENOUS | Status: AC
Start: 2016-08-01 — End: 2016-08-02
  Administered 2016-08-01: 19:00:00 via INTRAVENOUS

## 2016-08-01 NOTE — Progress Notes (Signed)
Pt had one small bloody bowel movement last night. RN saw the blood in the toilet and assessed pt who stated that she was doing just fine. Will pass on to day RN

## 2016-08-01 NOTE — Progress Notes (Signed)
Tech offered Pt a bath. Pt requesting to wait until later.

## 2016-08-01 NOTE — Progress Notes (Addendum)
PROGRESS NOTE  Norma Smith  SJG:[id number]  DOB: 1933/12/16  DOA: 07/30/2016 PCP: Biagio Borg, MD  Brief Admission Hx: 83 y.o. female with medical history significant for CLL on remission, DM, Osteoporosis with chronic back pain ,HTN, HLD, CKD, HCM (followed by Dr. Stanford Breed)  Iron deficiency anemia, prior history of GIB s/p 4 U transfusion of blood about 10 years ago while living in [place], History of colon polyps, Aortic stenosis, presenting to the ED with progressive, 1 month history of exertional shortness of breath, fatigue, left sided abdominal pressure and dark stools.  MDM/Assessment & Plan:    Symptomatic anemia likely due to Gastro Intestinal Bleed, in the setting of chronic Iron deficiency anemia   Etiology is unknown at this time; history of colon polys and GIB in the past, requiring 4 units of blood, while in [place]. IVProtonix given.  Baseline Hgb is 10-11 , current Hgb is 5.4 with MCV normal   BUN is 61. Received 2 Units of blood 7/10.   IV Protonix bid. Continue to transfuse for Hb less than 8 Hg is down to 7 today, will transfuse additional 2 units PRBC GI consulted:  Pending cardiac clearance before procedures No NSAIDs Anemia panel   CLL/ Monoclonal B-cell lymphocytosis of unknown significance. Following CBC, normal WBC and platelets. No acute issues Can follow up with Oncology as OP  Hypertension BP 124/65   Pulse 78   Temp 98.3 F (36.8 C) (Oral)   Resp 16   SpO2 100%  Controlled Hold BP as patient is symptomatic and due to volume loss.   Hyperlipidemia Continue home statins  Hypertrophic cardiomyopathy/ Aortic stenosis EKG SR without ACS, TN 0.02. CXR NAD. Stable cardiomegaly. Had chest pain likely due to Cardiology following and working up for clearance for GI procedures, will need repeat echocardiogram today  Type 2 Diabetes  CBG (last 3)   Recent Labs  07/31/16 2128 08/01/16 0722 08/01/16 1154  GLUCAP 123* 110* 161*   GERD, no acute  symptoms Continue PPI bid with Protonix   Subclinical hypothyroidism. Most recent TSH 5.75. Not on meds  Can follow with PCP   Chronic back pain/Osteopenia Continue tylenol IV prn  Anxiety Continue home Valium  DVT prophylaxis:  SCDs   Code Status:    DNR Family Communication:  Discussed with patient Disposition Plan: TBD  Consultants:  GI  Subjective: Pt has been having some hematochezia   Objective: Vitals:   07/31/16 2027 07/31/16 2100 08/01/16 0443 08/01/16 1023  BP: (!) 151/43  (!) 151/46 (!) 133/40  Pulse: 77  78 73  Resp: 16  16 18   Temp: 98.7 F (37.1 C)  98.6 F (37 C) 98.1 F (36.7 C)  TempSrc:    Oral  SpO2: 100%  96% 99%  Weight: 63 kg (139 lb)     Height:  5\' 2"  (1.575 m)      Intake/Output Summary (Last 24 hours) at 08/01/16 1722 Last data filed at 08/01/16 1350  Gross per 24 hour  Intake              360 ml  Output                0 ml  Net              360 ml   Filed Weights   07/30/16 2033 07/31/16 2027  Weight: 66.9 kg (147 lb 6.4 oz) 63 kg (139 lb)   REVIEW OF SYSTEMS  As per history otherwise all  reviewed and reported negative  Exam:  General exam: awake, alert, NAD, appears very pale Respiratory system: Clear. No increased work of breathing. Cardiovascular system: S1 & S2 heard, RRR.  Gastrointestinal system: Abdomen is nondistended, soft and nontender. Normal bowel sounds heard. Central nervous system: Alert and oriented. No focal neurological deficits. Extremities: no cyanosis.  Data Reviewed: Basic Metabolic Panel:  Recent Labs Lab 07/30/16 0935 07/31/16 0435 08/01/16 1019  NA 142 145 140  K 4.1 4.0 4.0  CL 113* 118* 116*  CO2 19* 21* 20*  GLUCOSE 171* 113* 238*  BUN 61* 41* 35*  CREATININE 1.32* 1.12* 1.15*  CALCIUM 8.3* 8.1* 7.9*   Liver Function Tests:  Recent Labs Lab 07/31/16 0435  AST 19  ALT 12*  ALKPHOS 40  BILITOT 1.2  PROT 4.9*  ALBUMIN 2.6*   No results for input(s): LIPASE, AMYLASE in  the last 168 hours. No results for input(s): AMMONIA in the last 168 hours. CBC:  Recent Labs Lab 07/30/16 0935 07/31/16 0435 08/01/16 1019  WBC 10.3 11.2* 8.6  HGB 5.3* 8.0* 7.2*  HCT 17.1* 24.1* 22.8*  MCV 95.5 89.6 93.4  PLT 195 166 172   Cardiac Enzymes:  Recent Labs Lab 07/30/16 2040 07/31/16 0032  TROPONINI <0.03 <0.03   CBG (last 3)   Recent Labs  07/31/16 2128 08/01/16 0722 08/01/16 1154  GLUCAP 123* 110* 161*   No results found for this or any previous visit (from the past 240 hour(s)).   Studies: No results found. Scheduled Meds: . amLODipine  5 mg Oral Daily  . atorvastatin  20 mg Oral Daily  . ferrous sulfate  325 mg Oral BID WC  . insulin aspart  0-9 Units Subcutaneous TID WC  . irbesartan  300 mg Oral QHS  . metoprolol tartrate  12.5 mg Oral q morning - 10a  . pantoprazole (PROTONIX) IV  40 mg Intravenous BID   Continuous Infusions: . sodium chloride 10 mL/hr at 07/30/16 1420  . methocarbamol (ROBAXIN)  IV 500 mg (07/30/16 1411)   Active Problems:   Iron deficiency   Colon polyps   Diabetes (HCC)   Hyperlipidemia   Hypertension   Anemia   Osteopenia   CLL (chronic lymphocytic leukemia) (HCC)   Fatigue   Aortic stenosis, moderate   Hypertrophic obstructive cardiomyopathy (HCC)   Bruit   Claudication (HCC)   Depression   Daytime hypersomnolence   Anxiety   Esophageal reflux   Monoclonal B-cell lymphocytosis of unknown significance   CKD (chronic kidney disease) stage 3, GFR 30-59 ml/min   Subclinical hypothyroidism   DNR (do not resuscitate) discussion   Symptomatic anemia  Time spent:   Irwin Brakeman, MD, FAAFP [facility] Pager [phone]  If 7PM-7AM, please contact night-coverage [email or web address] Password [email or web address] 08/01/2016, 5:22 PM    LOS: 0 days

## 2016-08-01 NOTE — Progress Notes (Signed)
Progress Note  Patient Name: Norma Smith Date of Encounter: 08/01/2016  Primary Cardiologist: Dr. Stanford Breed  Subjective   Denies any further CP.  Inpatient Medications    Scheduled Meds: . amLODipine  5 mg Oral Daily  . atorvastatin  20 mg Oral Daily  . ferrous sulfate  325 mg Oral BID WC  . insulin aspart  0-9 Units Subcutaneous TID WC  . irbesartan  300 mg Oral QHS  . metoprolol tartrate  12.5 mg Oral q morning - 10a  . pantoprazole (PROTONIX) IV  40 mg Intravenous BID   Continuous Infusions: . sodium chloride 10 mL/hr at 07/30/16 1420  . methocarbamol (ROBAXIN)  IV 500 mg (07/30/16 1411)   PRN Meds: acetaminophen **OR** acetaminophen, bisacodyl, diazepam, methocarbamol (ROBAXIN)  IV, ondansetron **OR** ondansetron (ZOFRAN) IV, senna-docusate   Vital Signs    Vitals:   07/31/16 2027 07/31/16 2100 08/01/16 0443 08/01/16 1023  BP: (!) 151/43  (!) 151/46 (!) 133/40  Pulse: 77  78 73  Resp: 16  16 18   Temp: 98.7 F (37.1 C)  98.6 F (37 C) 98.1 F (36.7 C)  TempSrc:    Oral  SpO2: 100%  96% 99%  Weight: 139 lb (63 kg)     Height:  5\' 2"  (1.575 m)      Intake/Output Summary (Last 24 hours) at 08/01/16 1145 Last data filed at 08/01/16 0945  Gross per 24 hour  Intake           699.83 ml  Output                0 ml  Net           699.83 ml   Filed Weights   07/30/16 2033 07/31/16 2027  Weight: 147 lb 6.4 oz (66.9 kg) 139 lb (63 kg)    Telemetry    NSR - Personally Reviewed  ECG    NSR with LVH and repol change - Personally Reviewed  Physical Exam   GEN: No acute distress.   Neck: No JVD Cardiac: RRR, no murmurs, rubs, or gallops.  Respiratory: Clear to auscultation bilaterally. GI: Soft, nontender, non-distended  MS: No edema; No deformity. Neuro:  Nonfocal  Psych: Normal affect   Labs    Chemistry Recent Labs Lab 07/30/16 0935 07/31/16 0435 08/01/16 1019  NA 142 145 140  K 4.1 4.0 4.0  CL 113* 118* 116*  CO2 19* 21* 20*  GLUCOSE  171* 113* 238*  BUN 61* 41* 35*  CREATININE 1.32* 1.12* 1.15*  CALCIUM 8.3* 8.1* 7.9*  PROT  --  4.9*  --   ALBUMIN  --  2.6*  --   AST  --  19  --   ALT  --  12*  --   ALKPHOS  --  40  --   BILITOT  --  1.2  --   GFRNONAA 37* 45* 44*  GFRAA 43* 52* 51*  ANIONGAP 10 6 4*     Hematology Recent Labs Lab 07/30/16 0935 07/30/16 2040 07/31/16 0435 08/01/16 1019  WBC 10.3  --  11.2* 8.6  RBC 1.79* 2.87* 2.69* 2.44*  HGB 5.3*  --  8.0* 7.2*  HCT 17.1*  --  24.1* 22.8*  MCV 95.5  --  89.6 93.4  MCH 29.6  --  29.7 29.5  MCHC 31.0  --  33.2 31.6  RDW 14.2  --  17.8* 17.9*  PLT 195  --  166 172    Cardiac Enzymes  Recent Labs Lab 07/30/16 2040 07/31/16 0032  TROPONINI <0.03 <0.03    Recent Labs Lab 07/30/16 0952  TROPIPOC 0.02     BNPNo results for input(s): BNP, PROBNP in the last 168 hours.   DDimer No results for input(s): DDIMER in the last 168 hours.   Radiology    No results found.  Cardiac Studies   none  Patient Profile     83 y.o. female with a hx of CLL in remission, DM, Osteoporosis with chronic back pain ,HTN, HLD, CKD III, HCM,Iron deficiency anemia, prior GIB s/p 4 U transfusion of blood about 10 years ago (in [place]), colon polyps, Aortic stenosis Vascular dz (based on exam 2013), DNR. She is being seen  for chest pain and preop evaluation for GIB at the request of Dr Wynetta Emery.  Assessment & Plan    1. Chest pain - sx were prolonged without enzyme elevations.  Trop negative x 3 - ECG with LVH and repolarization abnormality - this is new since 2014 - she was not getting exertional sx routinely, not until she became acutely ill. - she has a history of HOCM which is likely contributing to CP in the setting of dehydration and anemia with supply demand mismatch and demand ischemia. - symptoms have improved with transfusion and hydration - pt asked how aggressive she was willing to be in her care, regarding stress test and/or cath, she will think  about it. She is really not willing to have OHS. - If echo shows normal LVF then I do not think further testing for ischemia is necessary at this time as CP has resolved with transfusion and hydration.  2. HOCM:  - Need to make sure she is hydrated - she was mildy orthostatic on exam yesterday  - BUN/Cr were higher than usual on admission but improved today (1.49>1.15) - repeat 2D echo - will ask tech to ck gradients w/ ValSalva, they were very high in 2014  3. Anemia without overt bleeding and other problems below: - per GI - unsure how much of her anemia is related to GI source or CLL - per IM/GI - Hb improved (5.3>>8>>7.2)  4.  CLL - per [facility]  Await for further recs with 2D echo.  Signed, Fransico Him, MD 08/01/2016, 11:45 AM

## 2016-08-01 NOTE — Progress Notes (Signed)
Repeat ECHO recommended for today.  Will not pursue any bowel prep today.  Will revisit tomorrow, since no prep today will not do procedures tomorrow.

## 2016-08-01 NOTE — Progress Notes (Signed)
Subjective: Couple episodes hematochezia yesterday. No abdominal pain. No further chest pain or dyspnea post-transfusion.  Objective: Vital signs in last 24 hours: Temp:  [98.1 F (36.7 C)-98.7 F (37.1 C)] 98.1 F (36.7 C) (07/11 1023) Pulse Rate:  [73-78] 73 (07/11 1023) Resp:  [16-19] 18 (07/11 1023) BP: (133-151)/(40-46) 133/40 (07/11 1023) SpO2:  [96 %-100 %] 99 % (07/11 1023) Weight:  [63 kg (139 lb)] 63 kg (139 lb) (07/10 2027) Weight change: -3.81 kg (-8 lb 6.4 oz) Last BM Date: 07/31/16  PE: GEN:  Pale-appearing, NAD  Lab Results: CBC    Component Value Date/Time   WBC 11.2 (H) 07/31/2016 0435   RBC 2.69 (L) 07/31/2016 0435   HGB 8.0 (L) 07/31/2016 0435   HGB 10.7 (L) 12/02/2014 1216   HCT 24.1 (L) 07/31/2016 0435   HCT 33.0 (L) 12/02/2014 1216   PLT 166 07/31/2016 0435   PLT 194 12/02/2014 1216   MCV 89.6 07/31/2016 0435   MCV 93.5 12/02/2014 1216   MCH 29.7 07/31/2016 0435   MCHC 33.2 07/31/2016 0435   RDW 17.8 (H) 07/31/2016 0435   RDW 13.5 12/02/2014 1216   LYMPHSABS 3.9 03/08/2016 0918   LYMPHSABS 3.3 12/02/2014 1216   MONOABS 0.7 03/08/2016 0918   MONOABS 0.6 12/02/2014 1216   EOSABS 0.2 03/08/2016 0918   EOSABS 0.2 12/02/2014 1216   BASOSABS 0.1 03/08/2016 0918   BASOSABS 0.1 12/02/2014 1216   CMP     Component Value Date/Time   NA 145 07/31/2016 0435   NA 142 11/30/2013 1231   K 4.0 07/31/2016 0435   K 3.9 11/30/2013 1231   CL 118 (H) 07/31/2016 0435   CL 105 12/14/2011 0851   CO2 21 (L) 07/31/2016 0435   CO2 31 (H) 11/30/2013 1231   GLUCOSE 113 (H) 07/31/2016 0435   GLUCOSE 91 11/30/2013 1231   GLUCOSE 100 (H) 12/14/2011 0851   BUN 41 (H) 07/31/2016 0435   BUN 29.2 (H) 11/30/2013 1231   CREATININE 1.12 (H) 07/31/2016 0435   CREATININE 1.2 (H) 11/30/2013 1231   CALCIUM 8.1 (L) 07/31/2016 0435   CALCIUM 10.5 (H) 11/30/2013 1231   PROT 4.9 (L) 07/31/2016 0435   PROT 7.2 11/30/2013 1231   ALBUMIN 2.6 (L) 07/31/2016 0435   ALBUMIN  3.8 11/30/2013 1231   AST 19 07/31/2016 0435   AST 27 11/30/2013 1231   ALT 12 (L) 07/31/2016 0435   ALT 16 11/30/2013 1231   ALKPHOS 40 07/31/2016 0435   ALKPHOS 48 11/30/2013 1231   BILITOT 1.2 07/31/2016 0435   BILITOT 0.55 11/30/2013 1231   GFRNONAA 45 (L) 07/31/2016 0435   GFRAA 52 (L) 07/31/2016 0435   Assessment:  1.  Anemia now with some intermittent hematochezia.   2.  Aortic stenosis and hypertrophic cardiomyopathy.  Plan:  1.  Cardiology input appreciated; holding off on any GI testing until cardiac clearance obtained. 2.  If clearance obtained within the next few hours, could do prep for colonoscopy and endoscopy tomorrow; otherwise, prep will likely be tomorrow for procedures Friday. 3.  [facility] will follow.   Landry Dyke 08/01/2016, 10:28 AM   Pager [phone] If no answer or after 5 PM call [phone]

## 2016-08-02 ENCOUNTER — Telehealth: Payer: Self-pay | Admitting: Cardiology

## 2016-08-02 ENCOUNTER — Observation Stay (HOSPITAL_COMMUNITY): Payer: Medicare HMO

## 2016-08-02 ENCOUNTER — Inpatient Hospital Stay (HOSPITAL_COMMUNITY): Payer: Medicare HMO

## 2016-08-02 DIAGNOSIS — I371 Nonrheumatic pulmonary valve insufficiency: Secondary | ICD-10-CM

## 2016-08-02 LAB — CBC
HEMATOCRIT: 31.4 % — AB (ref 36.0–46.0)
HEMOGLOBIN: 10 g/dL — AB (ref 12.0–15.0)
MCH: 28.8 pg (ref 26.0–34.0)
MCHC: 31.8 g/dL (ref 30.0–36.0)
MCV: 90.5 fL (ref 78.0–100.0)
Platelets: 181 10*3/uL (ref 150–400)
RBC: 3.47 MIL/uL — ABNORMAL LOW (ref 3.87–5.11)
RDW: 17.7 % — AB (ref 11.5–15.5)
WBC: 9.6 10*3/uL (ref 4.0–10.5)

## 2016-08-02 LAB — HEMOGLOBIN AND HEMATOCRIT, BLOOD
HCT: 33.2 % — ABNORMAL LOW (ref 36.0–46.0)
HEMATOCRIT: 31 % — AB (ref 36.0–46.0)
HEMOGLOBIN: 10.1 g/dL — AB (ref 12.0–15.0)
HEMOGLOBIN: 10.7 g/dL — AB (ref 12.0–15.0)

## 2016-08-02 LAB — BASIC METABOLIC PANEL
ANION GAP: 4 — AB (ref 5–15)
BUN: 27 mg/dL — ABNORMAL HIGH (ref 6–20)
CALCIUM: 7.8 mg/dL — AB (ref 8.9–10.3)
CO2: 21 mmol/L — AB (ref 22–32)
CREATININE: 0.99 mg/dL (ref 0.44–1.00)
Chloride: 116 mmol/L — ABNORMAL HIGH (ref 101–111)
GFR calc non Af Amer: 52 mL/min — ABNORMAL LOW (ref >60)
Glucose, Bld: 106 mg/dL — ABNORMAL HIGH (ref 65–99)
Potassium: 3.9 mmol/L (ref 3.5–5.1)
SODIUM: 141 mmol/L (ref 135–145)

## 2016-08-02 LAB — ECHOCARDIOGRAM COMPLETE
Height: 62 in
WEIGHTICAEL: 2224 [oz_av]

## 2016-08-02 LAB — GLUCOSE, CAPILLARY
GLUCOSE-CAPILLARY: 95 mg/dL (ref 65–99)
GLUCOSE-CAPILLARY: 115 mg/dL — AB (ref 65–99)
GLUCOSE-CAPILLARY: 109 mg/dL — AB (ref 65–99)
GLUCOSE-CAPILLARY: 230 mg/dL — AB (ref 65–99)

## 2016-08-02 MED ORDER — POLYETHYLENE GLYCOL 3350 17 GM/SCOOP PO POWD
1.0000 | Freq: Once | ORAL | Status: AC
  Administered 2016-08-02: 255 g via ORAL
  Filled 2016-08-02: qty 255

## 2016-08-02 NOTE — Progress Notes (Signed)
Subjective: Some more blood in stool.  Objective: Vital signs in last 24 hours: Temp:  [98.4 F (36.9 C)-99.6 F (37.6 C)] 98.4 F (36.9 C) (07/12 0912) Pulse Rate:  [68-77] 77 (07/12 0912) Resp:  [17-20] 18 (07/12 0912) BP: (119-183)/(41-61) 154/61 (07/12 0912) SpO2:  [96 %-100 %] 96 % (07/12 0912) Weight change:  Last BM Date: 08/01/16  PE: GEN:  Pale, NAD  Lab Results: CBC    Component Value Date/Time   WBC 9.6 08/02/2016 0613   RBC 3.47 (L) 08/02/2016 0613   HGB 10.0 (L) 08/02/2016 0613   HGB 10.1 (L) 08/02/2016 0613   HGB 10.7 (L) 12/02/2014 1216   HCT 31.4 (L) 08/02/2016 0613   HCT 31.0 (L) 08/02/2016 0613   HCT 33.0 (L) 12/02/2014 1216   PLT 181 08/02/2016 0613   PLT 194 12/02/2014 1216   MCV 90.5 08/02/2016 0613   MCV 93.5 12/02/2014 1216   MCH 28.8 08/02/2016 0613   MCHC 31.8 08/02/2016 0613   RDW 17.7 (H) 08/02/2016 0613   RDW 13.5 12/02/2014 1216   LYMPHSABS 3.9 03/08/2016 0918   LYMPHSABS 3.3 12/02/2014 1216   MONOABS 0.7 03/08/2016 0918   MONOABS 0.6 12/02/2014 1216   EOSABS 0.2 03/08/2016 0918   EOSABS 0.2 12/02/2014 1216   BASOSABS 0.1 03/08/2016 0918   BASOSABS 0.1 12/02/2014 1216   Assessment:  1.  Anemia now with some intermittent hematochezia.   2.  Aortic stenosis and hypertrophic cardiomyopathy.  Plan:  1.  Tentatively planned EGD/Colon with Dr. Oletta Lamas tomorrow 10:45, pending cardiology clearance. 2.  Will wait until definitive cardiac clearance before writing bowel preparation instructions.   Norma Smith 08/02/2016, 1:30 PM   Pager [phone] If no answer or after 5 PM call [phone]

## 2016-08-02 NOTE — Progress Notes (Signed)
[facility] PROGRESS NOTE  Norma Smith XLK:[id number] DOB: 05/22/1933 DOA: 07/30/2016 PCP: Biagio Borg, MD   Brief summary  83 y.o.femalewith medical history significant for CLL on remission, DM, Osteoporosis with chronic back pain ,HTN, HLD, CKD, HCM (followed by Dr. Stanford Breed) Iron deficiency anemia, prior history of GIB s/p 4 U transfusion of blood about 10 years ago while living in [place], History of colon polyps, Aortic stenosis, presenting to the ED with progressive, 1 month history of exertional shortness of breath, fatigue, left sided abdominal pressure and dark stools.  Assessment & Plan:   Symptomatic anemia likely due to Gastro Intestinal Bleed, chronic Iron deficiency anemia Etiology is unknown at this time; history of colon polys and GIB in the past, requiring 4 units of blood, while in [place]. IVProtonix given. Baseline Hgb is 10-11, current Hgb is 5.4 with MCV normal BUN is 61.  -cont monitor, Received 2 Units of blood 7/10 then 7/11. Tf prn. IV Protonix bid. Hold oral iron prior to endoscopy  -GI consulted:  Pending cardiac clearance before procedures  CLL/ Monoclonal B-cell lymphocytosis of unknown significance. Following CBC, normal WBC and platelets. No acute issues -Can follow up with Oncology as OP  Hypertension. Cont amlodipine, BB. Monitor titrate as needed  Hyperlipidemia. Continue home statins  Hypertrophic cardiomyopathy/ Aortic stenosis EKG SR without ACS, TN 0.02. CXR NAD. Stable cardiomegaly. Had chest pain likely due to Cardiology following and working up for clearance for GI procedures -pend repeat echocardiogram today, cardiology is following   Type 2 Diabetes. ha1c-5.3. Monitor on ISS. Home regimen metformin.   GERD,no acute symptoms. Continue PPI bid with Protonix   Subclinical hypothyroidism. Most recent TSH 5.75. Not on meds. Can follow with PCP   Chronic back pain/Osteopenia. Continue tylenol prn  Anxiety Continue  home Valium  DVT prophylaxis:SCDs  Code Status:DNR Family Communication:Discussed with patient, updated her family at the bedside. All questions answered.  Disposition Plan:TBD  Consultants:  GI  HPI/Subjective: Alert, no distress.   Objective: Vitals:   08/02/16 0436 08/02/16 0912  BP: (!) 168/50 (!) 154/61  Pulse: 75 77  Resp: 18 18  Temp: 99.6 F (37.6 C) 98.4 F (36.9 C)    Intake/Output Summary (Last 24 hours) at 08/02/16 1259 Last data filed at 08/02/16 1251  Gross per 24 hour  Intake          1379.16 ml  Output              500 ml  Net           879.16 ml   Filed Weights   07/30/16 2033 07/31/16 2027  Weight: 66.9 kg (147 lb 6.4 oz) 63 kg (139 lb)    Exam:   General:  No distress   Cardiovascular: s1,s2 rrr  Respiratory: CTA BL  Abdomen: soft, nt, nd   Musculoskeletal: no leg edema    Data Reviewed: Basic Metabolic Panel:  Recent Labs Lab 07/30/16 0935 07/31/16 0435 08/01/16 1019 08/02/16 0613  NA 142 145 140 141  K 4.1 4.0 4.0 3.9  CL 113* 118* 116* 116*  CO2 19* 21* 20* 21*  GLUCOSE 171* 113* 238* 106*  BUN 61* 41* 35* 27*  CREATININE 1.32* 1.12* 1.15* 0.99  CALCIUM 8.3* 8.1* 7.9* 7.8*   Liver Function Tests:  Recent Labs Lab 07/31/16 0435  AST 19  ALT 12*  ALKPHOS 40  BILITOT 1.2  PROT 4.9*  ALBUMIN 2.6*   No results for input(s): LIPASE, AMYLASE in the last  168 hours. No results for input(s): AMMONIA in the last 168 hours. CBC:  Recent Labs Lab 07/30/16 0935 07/31/16 0435 08/01/16 1019 08/02/16 0613  WBC 10.3 11.2* 8.6 9.6  HGB 5.3* 8.0* 7.2* 10.1*  10.0*  HCT 17.1* 24.1* 22.8* 31.0*  31.4*  MCV 95.5 89.6 93.4 90.5  PLT 195 166 172 181   Cardiac Enzymes:  Recent Labs Lab 07/30/16 2040 07/31/16 0032  TROPONINI <0.03 <0.03   BNP (last 3 results) No results for input(s): BNP in the last 8760 hours.  ProBNP (last 3 results) No results for input(s): PROBNP in the last 8760  hours.  CBG:  Recent Labs Lab 08/01/16 1154 08/01/16 1729 08/01/16 2115 08/02/16 0752 08/02/16 1148  GLUCAP 161* 77 139* 95 115*    No results found for this or any previous visit (from the past 240 hour(s)).   Studies: No results found.  Scheduled Meds: . amLODipine  5 mg Oral Daily  . atorvastatin  20 mg Oral Daily  . ferrous sulfate  325 mg Oral BID WC  . insulin aspart  0-9 Units Subcutaneous TID WC  . irbesartan  300 mg Oral QHS  . metoprolol tartrate  12.5 mg Oral q morning - 10a  . pantoprazole (PROTONIX) IV  40 mg Intravenous BID   Continuous Infusions: . sodium chloride 10 mL/hr at 07/30/16 1420  . methocarbamol (ROBAXIN)  IV 500 mg (07/30/16 1411)    Active Problems:   Iron deficiency   Colon polyps   Diabetes (HCC)   Hyperlipidemia   Hypertension   Anemia   Osteopenia   CLL (chronic lymphocytic leukemia) (HCC)   Fatigue   Aortic stenosis, moderate   Hypertrophic obstructive cardiomyopathy (HCC)   Bruit   Claudication (HCC)   Depression   Daytime hypersomnolence   Anxiety   Esophageal reflux   Monoclonal B-cell lymphocytosis of unknown significance   CKD (chronic kidney disease) stage 3, GFR 30-59 ml/min   Subclinical hypothyroidism   DNR (do not resuscitate) discussion   Symptomatic anemia   GI bleeding    Time spent: >35 minutes     Kinnie Feil  [facility] Pager [phone]. If 7PM-7AM, please contact night-coverage at [email or web address], password [facility] 08/02/2016, 12:59 PM  LOS: 1 day

## 2016-08-02 NOTE — Telephone Encounter (Signed)
Called the patient on her home phone and cell phone and left a VM for her to call me back to schedule her hospital followup.

## 2016-08-02 NOTE — Progress Notes (Signed)
2D echo has not been done.  I have personally talked with Norma Smith and will reorder echo stat with measurements of LVOT gradient with and without Valsalva

## 2016-08-02 NOTE — Progress Notes (Signed)
  Echocardiogram 2D Echocardiogram has been performed.  Jennette Dubin 08/02/2016, 12:26 PM

## 2016-08-02 NOTE — Progress Notes (Addendum)
2D echo results reviewed.  There is moderate LVH with normal LVF with EF 65-70%, increased stiffness of heart muscle.  There is mild AS, mild MS and mild MR.  There is turbulent flow in the LVOT but no significant gradient at rest or with Vaslsalva.  I think patient is low risk for colonscopy from a cardiac standpoint and should proceed. I suspect that her CP was related to demand ischemia in the setting of profound anemia.  Her pain is very atypical and only occurred when she would lay down and would resolve with turning over to her right side or sitting up.  She has not had any exertional CP and on SOB that can be attributed to anemia.  Her Troponin has remained normal.  If she has recurrent CP with resolution of anemia then may need to consider nuclear stress test but for now will continue to watch.  We will set up follouwp in our office with Dr. Stanford Breed.  Will sign off.  Call with any questions.

## 2016-08-03 ENCOUNTER — Inpatient Hospital Stay (HOSPITAL_COMMUNITY): Payer: Medicare HMO

## 2016-08-03 ENCOUNTER — Encounter (HOSPITAL_COMMUNITY): Payer: Self-pay | Admitting: *Deleted

## 2016-08-03 ENCOUNTER — Encounter (HOSPITAL_COMMUNITY)
Admission: EM | Disposition: A | Discharge status: Home / Self Care | Payer: Self-pay | Source: Home / Self Care | Attending: Internal Medicine

## 2016-08-03 ENCOUNTER — Inpatient Hospital Stay (HOSPITAL_COMMUNITY): Payer: Medicare HMO | Admitting: Anesthesiology

## 2016-08-03 DIAGNOSIS — I350 Nonrheumatic aortic (valve) stenosis: Secondary | ICD-10-CM

## 2016-08-03 DIAGNOSIS — D649 Anemia, unspecified: Secondary | ICD-10-CM

## 2016-08-03 DIAGNOSIS — E611 Iron deficiency: Secondary | ICD-10-CM

## 2016-08-03 DIAGNOSIS — K921 Melena: Principal | ICD-10-CM

## 2016-08-03 DIAGNOSIS — C9190 Lymphoid leukemia, unspecified not having achieved remission: Secondary | ICD-10-CM

## 2016-08-03 HISTORY — PX: ESOPHAGOGASTRODUODENOSCOPY (EGD) WITH PROPOFOL: SHX5813

## 2016-08-03 HISTORY — PX: COLONOSCOPY WITH PROPOFOL: SHX5780

## 2016-08-03 LAB — BPAM RBC
Blood Product Expiration Date: 201807192359
Blood Product Expiration Date: 201807192359
Blood Product Expiration Date: 201807312359
Blood Product Expiration Date: 201807312359
ISSUE DATE / TIME: 201807091220
ISSUE DATE / TIME: 201807091600
ISSUE DATE / TIME: 201807111831
ISSUE DATE / TIME: 201807120034
Unit Type and Rh: 600
Unit Type and Rh: 600
Unit Type and Rh: 600
Unit Type and Rh: 600

## 2016-08-03 LAB — TYPE AND SCREEN
ABO/RH(D): A NEG
Antibody Screen: NEGATIVE
Unit division: 0
Unit division: 0
Unit division: 0
Unit division: 0

## 2016-08-03 LAB — GLUCOSE, CAPILLARY
GLUCOSE-CAPILLARY: 132 mg/dL — AB (ref 65–99)
Glucose-Capillary: 96 mg/dL (ref 65–99)
Glucose-Capillary: 105 mg/dL — ABNORMAL HIGH (ref 65–99)
Glucose-Capillary: 124 mg/dL — ABNORMAL HIGH (ref 65–99)

## 2016-08-03 LAB — BASIC METABOLIC PANEL
ANION GAP: 5 (ref 5–15)
BUN: 17 mg/dL (ref 6–20)
CHLORIDE: 114 mmol/L — AB (ref 101–111)
CO2: 21 mmol/L — ABNORMAL LOW (ref 22–32)
Calcium: 7.8 mg/dL — ABNORMAL LOW (ref 8.9–10.3)
Creatinine, Ser: 0.88 mg/dL (ref 0.44–1.00)
GFR calc Af Amer: >60 mL/min (ref >60)
Glucose, Bld: 105 mg/dL — ABNORMAL HIGH (ref 65–99)
POTASSIUM: 3.8 mmol/L (ref 3.5–5.1)
Sodium: 140 mmol/L (ref 135–145)

## 2016-08-03 LAB — CBC
HEMATOCRIT: 32.4 % — AB (ref 36.0–46.0)
HEMOGLOBIN: 10.5 g/dL — AB (ref 12.0–15.0)
MCH: 29.6 pg (ref 26.0–34.0)
MCHC: 32.4 g/dL (ref 30.0–36.0)
MCV: 91.3 fL (ref 78.0–100.0)
PLATELETS: 199 10*3/uL (ref 150–400)
RBC: 3.55 MIL/uL — AB (ref 3.87–5.11)
RDW: 17.9 % — ABNORMAL HIGH (ref 11.5–15.5)
WBC: 9.4 10*3/uL (ref 4.0–10.5)

## 2016-08-03 SURGERY — ESOPHAGOGASTRODUODENOSCOPY (EGD) WITH PROPOFOL
Anesthesia: Monitor Anesthesia Care | Laterality: Left

## 2016-08-03 MED ORDER — PANTOPRAZOLE SODIUM 40 MG PO TBEC
40.0000 mg | DELAYED_RELEASE_TABLET | Freq: Two times a day (BID) | ORAL | Status: DC
  Administered 2016-08-03 – 2016-08-04 (×3): 40 mg via ORAL
  Filled 2016-08-03 (×3): qty 1

## 2016-08-03 MED ORDER — SODIUM CHLORIDE 0.9 % IV SOLN
INTRAVENOUS | Status: DC
  Administered 2016-08-03: 10:00:00 via INTRAVENOUS

## 2016-08-03 MED ORDER — LIDOCAINE HCL (CARDIAC) 20 MG/ML IV SOLN
INTRAVENOUS | Status: DC | PRN
  Administered 2016-08-03: 60 mg via INTRAVENOUS

## 2016-08-03 MED ORDER — LACTATED RINGERS IV SOLN
INTRAVENOUS | Status: DC
Start: 2016-08-03 — End: 2016-08-04
  Administered 2016-08-03: 09:00:00 via INTRAVENOUS

## 2016-08-03 MED ORDER — GLYCOPYRROLATE 0.2 MG/ML IJ SOLN
INTRAMUSCULAR | Status: DC | PRN
  Administered 2016-08-03: 0.2 mg via INTRAVENOUS

## 2016-08-03 MED ORDER — PROPOFOL 10 MG/ML IV BOLUS
INTRAVENOUS | Status: DC | PRN
  Administered 2016-08-03 (×3): 50 mg via INTRAVENOUS
  Administered 2016-08-03: 20 mg via INTRAVENOUS
  Administered 2016-08-03: 30 mg via INTRAVENOUS

## 2016-08-03 MED ORDER — PROPOFOL 500 MG/50ML IV EMUL
INTRAVENOUS | Status: DC | PRN
  Administered 2016-08-03: 65 ug/kg/min via INTRAVENOUS

## 2016-08-03 MED ORDER — PHENYLEPHRINE HCL 10 MG/ML IJ SOLN
INTRAVENOUS | Status: DC | PRN
  Administered 2016-08-03: 20 ug/min via INTRAVENOUS

## 2016-08-03 SURGICAL SUPPLY — 24 items

## 2016-08-03 NOTE — Progress Notes (Signed)
Norma Smith is a 83 y.o. female has presented with GI bleed.   The various methods of treatment have been discussed with the patient and family. After consideration of risks, benefits and other options for treatment, the patient has consented to  Procedure(s): EGD and colonoscopy  as a surgical intervention .  The patient's history has been reviewed, patient examined, no change in status, stable for surgery.  I have reviewed the patient's chart and labs.  Questions were answered to the patient's satisfaction.   Also discussed with patient's daughter.    Otis Brace MD, [place] 08/03/2016, 9:50 AM  Pager [phone]  If no answer or after 5 PM call [phone]

## 2016-08-03 NOTE — Brief Op Note (Signed)
07/30/2016 - 08/03/2016  11:16 AM  PATIENT:  Norma Smith  83 y.o. female  PRE-OPERATIVE DIAGNOSIS:  anemia, blood in stool  POST-OPERATIVE DIAGNOSIS:  gastritis  PROCEDURE:  Procedure(s): ESOPHAGOGASTRODUODENOSCOPY (EGD) WITH PROPOFOL (Left) COLONOSCOPY WITH PROPOFOL (Left)  SURGEON:  Surgeon(s) and Role:    * Zyion Doxtater, MD - Primary  Findings / recommendations --------------------------------------- - EGD showed gastritis with erosions in the antrum. No active bleeding. - EGD also showed around 1 cm submucosal nodule in the proximal esophagus. - Colonoscopy showed petechiae surrounding appendiceal orifice. No evidence of active bleeding. It also showed diverticulosis and hemorrhoids. Around 1 cm rectal polyp removed with a hot snare. - Full liquid diet. Advance as tolerated. - Monitor H&H. - Detailed discussion with the daughter regarding further management. Other options to identify bleeding source such as capsule endoscopy, GI bleeding scan as well as angiography also discussed. Upper EUS for submucosal nodule of the  esophagus also discussed. - She would like to do conservative management for now. She is not sure about upper EUS at this time.  - GI will follow.     Otis Brace MD, [place] 08/03/2016, 11:20 AM  Pager [phone]  If no answer or after 5 PM call [phone]

## 2016-08-03 NOTE — Transfer of Care (Signed)
Immediate Anesthesia Transfer of Care Note  Patient: Norma Smith  Procedure(s) Performed: Procedure(s): ESOPHAGOGASTRODUODENOSCOPY (EGD) WITH PROPOFOL (Left) COLONOSCOPY WITH PROPOFOL (Left)  Patient Location: PACU  Anesthesia Type:MAC  Level of Consciousness: awake, alert , oriented and patient cooperative  Airway & Oxygen Therapy: Patient Spontanous Breathing and Patient connected to nasal cannula oxygen  Post-op Assessment: Report given to RN, Post -op Vital signs reviewed and stable and Patient moving all extremities  Post vital signs: Reviewed and stable  Last Vitals:  Vitals:   08/03/16 0915 08/03/16 0924  BP: (!) 162/52 (!) 197/55  Pulse: 76   Resp: 18 (!) 21  Temp: 37.1 C 37.3 C    Last Pain:  Vitals:   08/03/16 0924  TempSrc: Oral  PainSc:          Complications: No apparent anesthesia complications

## 2016-08-03 NOTE — Anesthesia Procedure Notes (Signed)
Procedure Name: MAC Date/Time: 08/03/2016 10:10 AM Performed by: Izora Gala Pre-anesthesia Checklist: Patient identified, Emergency Drugs available, Suction available and Patient being monitored Patient Re-evaluated:Patient Re-evaluated prior to induction Oxygen Delivery Method: Nasal cannula Preoxygenation: Pre-oxygenation with 100% oxygen Induction Type: IV induction Placement Confirmation: positive ETCO2

## 2016-08-03 NOTE — Op Note (Signed)
[facility] Patient Name: Norma Smith Procedure Date : 08/03/2016 MRN: [id number] Attending MD: Otis Brace , MD Date of Birth: 12/04/1933 CSN: [id number] Age: 83 Admit Type: Outpatient Procedure:                Colonoscopy Indications:              Gastrointestinal bleeding Providers:                Otis Brace, MD, Cleda Daub, RN, Marcene Duos, Technician Referring MD:              Medicines:                Sedation Administered by an Anesthesia Professional Complications:            No immediate complications. Estimated Blood Loss:     Estimated blood loss was minimal. Procedure:                Pre-Anesthesia Assessment:                           - Prior to the procedure, a History and Physical                            was performed, and patient medications and                            allergies were reviewed. The patient's tolerance of                            previous anesthesia was also reviewed. The risks                            and benefits of the procedure and the sedation                            options and risks were discussed with the patient.                            All questions were answered, and informed consent                            was obtained. Prior Anticoagulants: The patient has                            taken no previous anticoagulant or antiplatelet                            agents. ASA Grade Assessment: III - A patient with                            severe systemic disease. After reviewing the risks  and benefits, the patient was deemed in                            satisfactory condition to undergo the procedure.                           - Prior to the procedure, a History and Physical                            was performed, and patient medications and                            allergies were reviewed. The patient's tolerance of       previous anesthesia was also reviewed. The risks                            and benefits of the procedure and the sedation                            options and risks were discussed with the patient.                            All questions were answered, and informed consent                            was obtained. Prior Anticoagulants: The patient has                            taken no previous anticoagulant or antiplatelet                            agents. ASA Grade Assessment: III - A patient with                            severe systemic disease. After reviewing the risks                            and benefits, the patient was deemed in                            satisfactory condition to undergo the procedure.                           After obtaining informed consent, the colonoscope                            was passed under direct vision. Throughout the                            procedure, the patient's blood pressure, pulse, and                            oxygen saturations were monitored continuously. The  [id number]) scope was introduced through                            the anus and advanced to the the cecum, identified                            by appendiceal orifice and ileocecal valve. The                            colonoscopy was technically difficult and complex                            due to a tortuous colon. Successful completion of                            the procedure was aided by changing the patient to                            a supine position. The colonoscopy was technically                            difficult and complex. Successful completion of the                            procedure was aided by applying abdominal pressure.                            The patient tolerated the procedure fairly well.                            The quality of the bowel preparation was good                            except  the ascending colon was fair. The ileocecal                            valve, appendiceal orifice, and rectum were                            photographed. Scope In: 10:22:36 AM Scope Out: 10:49:51 AM Scope Withdrawal Time: 0 hours 15 minutes 10 seconds  Total Procedure Duration: 0 hours 27 minutes 15 seconds  Findings:      The perianal exam findings include a skin tag.      procedure was extremely difficult because of tortuous and redundant       colon.      Petechiae were found around the appendiceal orifice.      The colon (entire examined portion) was significantly redundant.       Advancing the scope required changing the patient to a supine position       and applying abdominal pressure.      Multiple small and large-mouthed diverticula were found in the left       colon.      A 10 mm polyp was found in the rectum. The  polyp was sessile. The polyp       was removed with a hot snare. Resection and retrieval were complete.      Internal hemorrhoids were found during retroflexion. The hemorrhoids       were small.      There is no endoscopic evidence of bleeding in the entire colon. Impression:               - Perianal skin tag found on perianal exam.                           - Petechia(e) at the appendiceal orifice.                           - Redundant colon.                           - Diverticulosis in the left colon.                           - One 10 mm polyp in the rectum, removed with a hot                            snare. Resected and retrieved.                           - Internal hemorrhoids. Moderate Sedation:      Moderate (conscious) sedation was personally administered by an       anesthesia professional. The following parameters were monitored: oxygen       saturation, heart rate, blood pressure, and response to care. Recommendation:           - Return patient to hospital ward for ongoing care.                           - Full liquid diet.                            - Continue present medications.                           - Await pathology results.                           - monitor hemoglobin.                           - No repeat colonoscopy due to age. Procedure Code(s):        --- Professional ---                           [phone], Colonoscopy, flexible; with removal of                            tumor(s), polyp(s), or other lesion(s) by snare                            technique Diagnosis Code(s):        ---  Professional ---                           K63.89, Other specified diseases of intestine                           K64.8, Other hemorrhoids                           K62.1, Rectal polyp                           K64.4, Residual hemorrhoidal skin tags                           K92.2, Gastrointestinal hemorrhage, unspecified                           K57.30, Diverticulosis of large intestine without                            perforation or abscess without bleeding                           Q43.8, Other specified congenital malformations of                            intestine CPT copyright 2016 American Medical Association. All rights reserved. The codes documented in this report are preliminary and upon coder review may  be revised to meet current compliance requirements. Otis Brace, MD Otis Brace, MD 08/03/2016 11:04:35 AM Number of Addenda: 0

## 2016-08-03 NOTE — Progress Notes (Signed)
[facility] PROGRESS NOTE  Norma Smith BJS:[id number] DOB: Mar 07, 1933 DOA: 07/30/2016 PCP: Biagio Borg, MD   Brief summary  83 y.o.femalewith medical history significant for CLL on remission, DM, Osteoporosis with chronic back pain ,HTN, HLD, CKD, HCM (followed by Dr. Stanford Breed) Iron deficiency anemia, prior history of GIB s/p 4 U transfusion of blood about 10 years ago while living in [place], History of colon polyps, Aortic stenosis, presenting to the ED with progressive, 1 month history of exertional shortness of breath, fatigue, left sided abdominal pressure and dark stools.  Assessment & Plan:   Symptomatic anemia likely due to Gastro Intestinal Bleed, chronic Iron deficiency anemia Etiology is unknown at this time; history of colon polys and GIB in the past, requiring 4 units of blood, while in [place]. IVProtonix given. Baseline Hgb is 10-11, current Hgb is 5.4 with MCV normal BUN is 61.  -cont monitor, Received 2 Units of blood 7/10 then 7/11. Tf prn. IV Protonix bid. Hold oral iron prior to endoscopy  -GI consulted: scheduled for EGDtoday   CLL/ Monoclonal B-cell lymphocytosis of unknown significance. Following CBC, normal WBC and platelets. No acute issues -Can follow up with Oncology as OP  Hypertension. Cont amlodipine, BB. Monitor titrate as needed  Hyperlipidemia. Continue home statins  Hypertrophic cardiomyopathy/ Aortic stenosis EKG SR without ACS, TN 0.02. CXR NAD. Stable cardiomegaly. Had chest pain likely due to Cardiology followed for clearance for GI procedures -repeat echocardiogram: EF: 65% to 70%. mild AS.LVH.   Type 2 Diabetes. ha1c-5.3. Monitor on ISS. Home regimen metformin. Recommended diet control, hold metformin due to renal issues. F/u with PCP at discharge   GERD,no acute symptoms. Continue PPI bid with Protonix   Subclinical hypothyroidism. Most recent TSH 5.75. Not on meds. Can follow with PCP   Chronic back pain/Osteopenia.  Continue tylenol prn  Anxiety Continue home Valium  DVT prophylaxis:SCDs  Code Status:DNR Family Communication:Discussed with patient, updated her family at the bedside. All questions answered.  Disposition Plan:pend EGD  Consultants:  GI  HPI/Subjective: Alert, no distress.   Objective: Vitals:   08/02/16 2102 08/03/16 0452  BP: (!) 164/56 (!) 163/44  Pulse: 73 79  Resp: 18 18  Temp: 98.1 F (36.7 C) 98.9 F (37.2 C)    Intake/Output Summary (Last 24 hours) at 08/03/16 0901 Last data filed at 08/03/16 0826  Gross per 24 hour  Intake              360 ml  Output              501 ml  Net             -141 ml   Filed Weights   07/30/16 2033 07/31/16 2027 08/02/16 2102  Weight: 66.9 kg (147 lb 6.4 oz) 63 kg (139 lb) 63.7 kg (140 lb 8 oz)    Exam:   General:  No distress   Cardiovascular: s1,s2 rrr  Respiratory: CTA BL  Abdomen: soft, nt, nd   Musculoskeletal: no leg edema    Data Reviewed: Basic Metabolic Panel:  Recent Labs Lab 07/30/16 0935 07/31/16 0435 08/01/16 1019 08/02/16 0613 08/03/16 0445  NA 142 145 140 141 140  K 4.1 4.0 4.0 3.9 3.8  CL 113* 118* 116* 116* 114*  CO2 19* 21* 20* 21* 21*  GLUCOSE 171* 113* 238* 106* 105*  BUN 61* 41* 35* 27* 17  CREATININE 1.32* 1.12* 1.15* 0.99 0.88  CALCIUM 8.3* 8.1* 7.9* 7.8* 7.8*   Liver Function Tests:  Recent Labs Lab 07/31/16 0435  AST 19  ALT 12*  ALKPHOS 40  BILITOT 1.2  PROT 4.9*  ALBUMIN 2.6*   No results for input(s): LIPASE, AMYLASE in the last 168 hours. No results for input(s): AMMONIA in the last 168 hours. CBC:  Recent Labs Lab 07/30/16 0935 07/31/16 0435 08/01/16 1019 08/02/16 0613 08/02/16 1900 08/03/16 0445  WBC 10.3 11.2* 8.6 9.6  --  9.4  HGB 5.3* 8.0* 7.2* 10.1*  10.0* 10.7* 10.5*  HCT 17.1* 24.1* 22.8* 31.0*  31.4* 33.2* 32.4*  MCV 95.5 89.6 93.4 90.5  --  91.3  PLT 195 166 172 181  --  199   Cardiac Enzymes:  Recent Labs Lab  07/30/16 2040 07/31/16 0032  TROPONINI <0.03 <0.03   BNP (last 3 results) No results for input(s): BNP in the last 8760 hours.  ProBNP (last 3 results) No results for input(s): PROBNP in the last 8760 hours.  CBG:  Recent Labs Lab 08/02/16 0752 08/02/16 1148 08/02/16 1625 08/02/16 2053 08/03/16 0801  GLUCAP 95 115* 109* 230* 96    No results found for this or any previous visit (from the past 240 hour(s)).   Studies: No results found.  Scheduled Meds: . amLODipine  5 mg Oral Daily  . atorvastatin  20 mg Oral Daily  . insulin aspart  0-9 Units Subcutaneous TID WC  . irbesartan  300 mg Oral QHS  . metoprolol tartrate  12.5 mg Oral q morning - 10a  . pantoprazole (PROTONIX) IV  40 mg Intravenous BID   Continuous Infusions: . sodium chloride 10 mL/hr at 07/30/16 1420  . methocarbamol (ROBAXIN)  IV 500 mg (07/30/16 1411)    Active Problems:   Iron deficiency   Colon polyps   Diabetes (HCC)   Hyperlipidemia   Hypertension   Anemia   Osteopenia   CLL (chronic lymphocytic leukemia) (HCC)   Fatigue   Aortic stenosis, moderate   Hypertrophic obstructive cardiomyopathy (HCC)   Bruit   Claudication (HCC)   Depression   Daytime hypersomnolence   Anxiety   Esophageal reflux   Monoclonal B-cell lymphocytosis of unknown significance   CKD (chronic kidney disease) stage 3, GFR 30-59 ml/min   Subclinical hypothyroidism   DNR (do not resuscitate) discussion   Symptomatic anemia   GI bleeding    Time spent: >35 minutes     Kinnie Feil  [facility] Pager [phone]. If 7PM-7AM, please contact night-coverage at [email or web address], password [facility] 08/03/2016, 9:01 AM  LOS: 2 days

## 2016-08-03 NOTE — Anesthesia Preprocedure Evaluation (Addendum)
Anesthesia Evaluation  Patient identified by MRN, date of birth, ID band Patient awake    Reviewed: Allergy & Precautions, NPO status , Patient's Chart, lab work & pertinent test results  Airway Mallampati: II   Neck ROM: Full    Dental  (+) Edentulous Upper   Pulmonary former smoker,    breath sounds clear to auscultation       Cardiovascular hypertension, + Valvular Problems/Murmurs AS  Rhythm:Regular Rate:Normal     Neuro/Psych Depression    GI/Hepatic GERD  ,  Endo/Other  diabetes  Renal/GU Renal InsufficiencyRenal disease     Musculoskeletal   Abdominal   Peds  Hematology  (+) anemia ,   Anesthesia Other Findings   Reproductive/Obstetrics                            Anesthesia Physical Anesthesia Plan  ASA: III  Anesthesia Plan: MAC   Post-op Pain Management:    Induction: Intravenous  PONV Risk Score and Plan: 2 and Ondansetron and Dexamethasone  Airway Management Planned: Natural Airway  Additional Equipment:   Intra-op Plan:   Post-operative Plan:   Informed Consent: I have reviewed the patients History and Physical, chart, labs and discussed the procedure including the risks, benefits and alternatives for the proposed anesthesia with the patient or authorized representative who has indicated his/her understanding and acceptance.     Plan Discussed with: CRNA  Anesthesia Plan Comments:         Anesthesia Quick Evaluation

## 2016-08-03 NOTE — Op Note (Signed)
[facility] Patient Name: Norma Smith Procedure Date : 08/03/2016 MRN: [id number] Attending MD: Otis Brace , MD Date of Birth: 06-18-1933 CSN: [id number] Age: 82 Admit Type: Outpatient Procedure:                Upper GI endoscopy Indications:              Gastrointestinal bleeding of unknown origin Providers:                Otis Brace, MD, Cleda Daub, RN, Marcene Duos, Technician Referring MD:              Medicines:                Sedation Administered by an Anesthesia Professional Complications:            No immediate complications. Estimated Blood Loss:     Estimated blood loss was minimal. Procedure:                Pre-Anesthesia Assessment:                           - Prior to the procedure, a History and Physical                            was performed, and patient medications and                            allergies were reviewed. The patient's tolerance of                            previous anesthesia was also reviewed. The risks                            and benefits of the procedure and the sedation                            options and risks were discussed with the patient.                            All questions were answered, and informed consent                            was obtained. Prior Anticoagulants: The patient has                            taken no previous anticoagulant or antiplatelet                            agents. ASA Grade Assessment: III - A patient with                            severe systemic disease. After reviewing the risks  and benefits, the patient was deemed in                            satisfactory condition to undergo the procedure.                           After obtaining informed consent, the endoscope was                            passed under direct vision. Throughout the                            procedure, the patient's blood pressure, pulse,  and                            oxygen saturations were monitored continuously. The                            [id number] ([id number]) scope was introduced through the                            mouth, and advanced to the second part of duodenum.                            The upper GI endoscopy was accomplished without                            difficulty. The patient tolerated the procedure                            well. Scope In: Scope Out: Findings:      A single 10 mm submucosal nodule was found in the upper third of the       esophagus, 22 cm from the incisors.      A small hiatal hernia was present.      Scattered moderate inflammation characterized by erosions, erythema and       friability was found in the gastric body and in the gastric antrum.       Biopsies were taken with a cold forceps for histology.      The cardia and gastric fundus were normal on retroflexion.      The duodenal bulb, first portion of the duodenum and second portion of       the duodenum were normal. Impression:               - Submucosal nodule found in the esophagus.                           - Small hiatal hernia.                           - Gastritis. Biopsied.                           - Normal duodenal bulb, first portion of the  duodenum and second portion of the duodenum. Moderate Sedation:      Moderate (conscious) sedation was personally administered by an       anesthesia professional. The following parameters were monitored: oxygen       saturation, heart rate, blood pressure, and response to care. Recommendation:           - Return patient to hospital ward for ongoing care.                           - Clear liquid diet.                           - Continue present medications.                           - Await pathology results.                           - Consider EUS if family desires further workup of                            upper esophageal nodule. Procedure  Code(s):        --- Professional ---                           [id number], Esophagogastroduodenoscopy, flexible,                            transoral; with biopsy, single or multiple Diagnosis Code(s):        --- Professional ---                           K22.8, Other specified diseases of esophagus                           K44.9, Diaphragmatic hernia without obstruction or                            gangrene                           K29.70, Gastritis, unspecified, without bleeding                           K92.2, Gastrointestinal hemorrhage, unspecified CPT copyright 2016 American Medical Association. All rights reserved. The codes documented in this report are preliminary and upon coder review may  be revised to meet current compliance requirements. Otis Brace, MD Otis Brace, MD 08/03/2016 10:55:19 AM Number of Addenda: 0

## 2016-08-03 NOTE — Consult Note (Signed)
[facility] CM Primary Care Navigator  08/03/2016  Norma Smith Sep 28, 1933 [id number]   Met with patient at the bedside to identify possible discharge needs. Patient reports having chest and back pain, weakness and shortness of breath thathad led to this admission.  Patient endorses Dr. Cathlean Cower with [facility] as the primary care provider.   Patient shared using [facility] Order service and [facility] obtain medications without any problem.    Patient reports managing her own medications at home using "pill box" system filled weekly.  Patientreports that she is able to drive prior to admission, but her daughter Norma Smith) can providetransportationto herdoctors'appointments after discharge when needed. Transportation benefits with [employer] discussed with patient as well.  Patient lives alone at the [employer] and her daughter (lives in town) provides assistance with care as needed.  Anticipated discharge plan is home ([facility]) per patient.  Patient voiced understanding to follow-up withprimary care providerwhen she returns home,for a post discharge follow-up visit within a week or sooner if needs arise.Patient letter (with PCP's contact number) was provided as a reminder.  Explained to patient about [facility] CM services available for further healthmanagement but she verbalized being able to manageDM well at home which she has done for years, with recent A1C of 5.3.   Patientvoiced understanding to seekreferral to [facility] care management servicesfrom primary care provider if deemed necessary in thefuture.   [facility] care management information provided for future needs that may arise.  Patient opted and verbally agreed to [facility] to followup with her recovery at home.  Referral to [facility] calls made.   For questions, please contact:  Dannielle Huh, BSN, RN- [facility] Primary Care Navigator   Telephone: [phone] [place]

## 2016-08-03 NOTE — Anesthesia Postprocedure Evaluation (Signed)
Anesthesia Post Note  Patient: Xiomara Sevillano  Procedure(s) Performed: Procedure(s) (LRB): ESOPHAGOGASTRODUODENOSCOPY (EGD) WITH PROPOFOL (Left) COLONOSCOPY WITH PROPOFOL (Left)     Patient location during evaluation: Endoscopy Anesthesia Type: MAC Level of consciousness: awake and alert Pain management: pain level controlled Vital Signs Assessment: post-procedure vital signs reviewed and stable Respiratory status: spontaneous breathing, nonlabored ventilation, respiratory function stable and patient connected to nasal cannula oxygen Cardiovascular status: stable and blood pressure returned to baseline Anesthetic complications: no    Last Vitals:  Vitals:   08/03/16 1115 08/03/16 1132  BP: (!) 128/45 (!) 148/49  Pulse:  71  Resp: (!) 25 (!) 23  Temp:  36.4 C    Last Pain:  Vitals:   08/03/16 1132  TempSrc: Oral  PainSc:                  Landyn Lorincz,JAMES TERRILL

## 2016-08-04 DIAGNOSIS — E039 Hypothyroidism, unspecified: Secondary | ICD-10-CM

## 2016-08-04 LAB — CBC
HEMATOCRIT: 30.3 % — AB (ref 36.0–46.0)
HEMOGLOBIN: 9.8 g/dL — AB (ref 12.0–15.0)
MCH: 29.4 pg (ref 26.0–34.0)
MCHC: 32.3 g/dL (ref 30.0–36.0)
MCV: 91 fL (ref 78.0–100.0)
Platelets: 209 10*3/uL (ref 150–400)
RBC: 3.33 MIL/uL — ABNORMAL LOW (ref 3.87–5.11)
RDW: 17.4 % — AB (ref 11.5–15.5)
WBC: 10.8 10*3/uL — AB (ref 4.0–10.5)

## 2016-08-04 LAB — BASIC METABOLIC PANEL
ANION GAP: 4 — AB (ref 5–15)
BUN: 19 mg/dL (ref 6–20)
CALCIUM: 8 mg/dL — AB (ref 8.9–10.3)
CHLORIDE: 115 mmol/L — AB (ref 101–111)
CO2: 22 mmol/L (ref 22–32)
Creatinine, Ser: 1.12 mg/dL — ABNORMAL HIGH (ref 0.44–1.00)
GFR calc Af Amer: 52 mL/min — ABNORMAL LOW (ref >60)
GFR calc non Af Amer: 45 mL/min — ABNORMAL LOW (ref >60)
GLUCOSE: 94 mg/dL (ref 65–99)
Potassium: 4.3 mmol/L (ref 3.5–5.1)
Sodium: 141 mmol/L (ref 135–145)

## 2016-08-04 LAB — GLUCOSE, CAPILLARY
GLUCOSE-CAPILLARY: 113 mg/dL — AB (ref 65–99)
Glucose-Capillary: 87 mg/dL (ref 65–99)

## 2016-08-04 MED ORDER — ASPIRIN 81 MG PO TABS: 81.0000 mg | ORAL_TABLET | Freq: Every day | ORAL | Status: DC

## 2016-08-04 MED ORDER — PANTOPRAZOLE SODIUM 40 MG PO TBEC: 40.0000 mg | DELAYED_RELEASE_TABLET | Freq: Two times a day (BID) | ORAL | 0 refills | Status: DC

## 2016-08-04 NOTE — Progress Notes (Signed)
Patient discharged home per MD. Reviewed discharge instructions with patient and 2 daughters. Teach back used to evaluate understanding of when to follow up with PCP and medications administration. Patient escorted out via wheelchair and NT escort. Bartholomew Crews, RN

## 2016-08-04 NOTE — Progress Notes (Signed)
Mar Daring 11:19 AM  Subjective: Patient going home no problems from her procedures yesterday instructed to call about biopsies decision will need to be made about her aspirin which we discussed and no new complaints  Objective: Vital signs stable afebrile no acute distress abdomen is soft nontender hemoglobin okay  Assessment: Status post colonoscopy and endoscopy for anemia in patient on aspirin  Plan: Call my partner for pathology in 3-5 days and happy to see back when necessary and care with aspirin nonsteroidals and blood thinners going forward  [facility]  Pager [phone] After 5PM or if no answer call [phone]

## 2016-08-04 NOTE — Discharge Summary (Signed)
Physician Discharge Summary  Stephenie Navejas ZOX:[id number] DOB: [birth year] DOA: 07/30/2016  PCP: Biagio Borg, MD  Admit date: 07/30/2016 Discharge date: 08/04/2016  Time spent: >35 minutes  Recommendations for Outpatient Follow-up:  PCP in 3-5 days GI in 4 weeks  Discharge Diagnoses:  Active Problems:   Iron deficiency   Colon polyps   Diabetes (HCC)   Hyperlipidemia   Hypertension   Anemia   Osteopenia   CLL (chronic lymphocytic leukemia) (HCC)   Fatigue   Aortic stenosis, moderate   Hypertrophic obstructive cardiomyopathy (HCC)   Bruit   Claudication (HCC)   Depression   Daytime hypersomnolence   Anxiety   Esophageal reflux   Monoclonal B-cell lymphocytosis of unknown significance   CKD (chronic kidney disease) stage 3, GFR 30-59 ml/min   Subclinical hypothyroidism   DNR (do not resuscitate) discussion   Symptomatic anemia   GI bleeding   Discharge Condition: stable   Diet recommendation: DM  Filed Weights   08/02/16 2102 08/03/16 0924 08/04/16 0500  Weight: 63.7 kg (140 lb 8 oz) 63.5 kg (140 lb) 67.4 kg (148 lb 8 oz)    History of present illness:  83 y.o.femalewith medical history significant for CLL on remission, DM, Osteoporosis with chronic back pain ,HTN, HLD, CKD, HCM (followed by Dr. Stanford Breed) Iron deficiency anemia, prior history of GIB s/p 4 U transfusion of blood about 10 years ago while living in [place], History of colon polyps, Aortic stenosis, presenting to the ED with progressive, 1 month history of exertional shortness of breath, fatigue, left sided abdominal pressure and dark stools.  Hospital Course:   Symptomatic anemia likely due to Gastro Intestinal Bleed, chronic Iron deficiency anemia Etiology is unknown at this time; history of colon polys and GIB in the past, requiring 4 units of blood, while in [place]. IVProtonix given. Baseline Hgb is 10-11, current Hgb is 5.4 with MCV normal BUN is 61.  -Received2 Units of blood 7/10 then  7/11. Hg remains stable.   -underwent EGD/colonoscopy: EGD showed gastritis with erosions in the antrum. No active bleeding. EGD also showed around 1 cm submucosal nodule in the proximal esophagus. Colonoscopy showed petechiae surrounding appendiceal orifice. No evidence of active bleeding. It also showed diverticulosis and hemorrhoids. Around 1 cm rectal polyp removed with a hot snare. GI discussed with the daughter regarding further management. Other options to identify bleeding source such as capsule endoscopy, GI bleeding scan as well as angiography also discussed. Upper EUS for submucosal nodule of the  esophagus also discussed. - She would like to do conservative management for now. She is not sure about upper EUS at this time. Recommended outpatient follow up  CLL/ Monoclonal B-cell lymphocytosis of unknown significance. FollowingCBC, normal WBC and platelets. No acute issues -Can follow up with Oncology as OP  Hypertension. Cont amlodipine, BB. Monitor titrate as needed  Hyperlipidemia. Continue home statins  Hypertrophic cardiomyopathy/ Aortic stenosis EKG SR without ACS, TN 0.02. CXR NAD. Stable cardiomegaly. Had chest pain likely due to Cardiology followed for clearance for GI procedures -repeat echocardiogram: EF: 65% to 70%.mild AS.LVH. Hold aspirin for 7 days at discharge   Type 2 Diabetes. ha1c-5.3.  Recommended diet control, hold metformin due to renal issues. F/u with PCP at discharge   GERD,no acute symptoms. Continue PPI bid with Protonix   Subclinical hypothyroidism. Most recent TSH 5.75. Not on meds. Can follow with PCP   Chronic back pain/Osteopenia. Continue tylenol prn  Anxiety Continue home Valium  D/w patient, updated her family  at the bedside. All questions are answered   Procedures:  Egd/colon EGD showed gastritis with erosions in the antrum. No active bleeding. - EGD also showed around 1 cm submucosal nodule in the proximal esophagus. -  Colonoscopy showed petechiae surrounding appendiceal orifice. No evidence of active bleeding. It also showed diverticulosis and hemorrhoids. Around 1 cm rectal polyp removed with a hot snare. - Full liquid diet. Advance as tolerated. - Monitor H&H. - Detailed discussion with the daughter regarding further management. Other options to identify bleeding source such as capsule endoscopy, GI bleeding scan as well as angiography also discussed. Upper EUS for submucosal nodule of the  esophagus also discussed. - She would like to do conservative management for now. She is not sure about upper EUS at this time.  - GI will follow.       (i.e. Studies not automatically included, echos, thoracentesis, etc; not x-rays)  Consultations:  GI  Discharge Exam: Vitals:   08/04/16 0420 08/04/16 0900  BP: (!) 140/47 (!) 174/64  Pulse: 80 70  Resp: 18 18  Temp: 99.6 F (37.6 C) 99 F (37.2 C)    General: alert, no distress  Cardiovascular: s1,s2 rrr Respiratory: CTA BL  Discharge Instructions  Discharge Instructions    Diet - low sodium heart healthy    Complete by:  As directed    Increase activity slowly    Complete by:  As directed      Allergies as of 08/04/2016      Reactions   Codeine Other (See Comments)   Passed out   Oxycodone Other (See Comments)   Visual Hallucinations   Roxanol [morphine] Hives, Rash   Penicillins Rash   Has patient had a PCN reaction causing immediate rash, facial/tongue/throat swelling, SOB or lightheadedness with hypotension: Yes Has patient had a PCN reaction causing severe rash involving mucus membranes or skin necrosis: Yes Has patient had a PCN reaction that required hospitalization: No Has patient had a PCN reaction occurring within the last 10 years: No If all of the above answers are "NO", then may proceed with Cephalosporin use.      Medication List    STOP taking these medications   metFORMIN 1000 MG tablet Commonly known as:   GLUCOPHAGE   pioglitazone 15 MG tablet Commonly known as:  ACTOS     TAKE these medications   amLODipine 5 MG tablet Commonly known as:  NORVASC TAKE 1 TABLET EVERY DAY   aspirin 81 MG tablet Take 1 tablet (81 mg total) by mouth daily. Start taking on:  08/21/2016 What changed:  These instructions start on 08/21/2016. If you are unsure what to do until then, ask your doctor or other care provider.   atorvastatin 20 MG tablet Commonly known as:  LIPITOR TAKE 1 TABLET (20 MG TOTAL) BY MOUTH DAILY.   Calcium-D 600-400 MG-UNIT Tabs Take by mouth 2 (two) times daily.   CENTRUM SILVER PO Take 1 tablet by mouth daily.   diazepam 5 MG tablet Commonly known as:  VALIUM TAKE 1/2 TABLET BY MOUTH DAILY AS NEEDED FOR STRESS What changed:  See the new instructions.   glucose blood test strip Used to test blood sugar 3 times daily   irbesartan 300 MG tablet Commonly known as:  AVAPRO TAKE 1 TABLET AT BEDTIME   Iron 325 (65 Fe) MG Tabs Take 1 tablet by mouth 2 (two) times daily.   metoprolol tartrate 25 MG tablet Commonly known as:  LOPRESSOR Take 0.5 tablets (12.5  mg total) by mouth every morning.   pantoprazole 40 MG tablet Commonly known as:  PROTONIX Take 1 tablet (40 mg total) by mouth 2 (two) times daily.      Allergies  Allergen Reactions  . Codeine Other (See Comments)    Passed out  . Oxycodone Other (See Comments)    Visual Hallucinations  . Roxanol [Morphine] Hives and Rash  . Penicillins Rash    Has patient had a PCN reaction causing immediate rash, facial/tongue/throat swelling, SOB or lightheadedness with hypotension: Yes Has patient had a PCN reaction causing severe rash involving mucus membranes or skin necrosis: Yes Has patient had a PCN reaction that required hospitalization: No Has patient had a PCN reaction occurring within the last 10 years: No If all of the above answers are "NO", then may proceed with Cephalosporin use.       The results of  significant diagnostics from this hospitalization (including imaging, microbiology, ancillary and laboratory) are listed below for reference.    Significant Diagnostic Studies: Dg Chest 2 View  Result Date: 07/30/2016 CLINICAL DATA:  83 year old female with chest pain starting last night. Shortness breath, nausea with exertion. Initial encounter. EXAM: CHEST  2 VIEW COMPARISON:  12/15/2012. FINDINGS: Cardiomegaly. Coronary artery calcifications. Calcified mildly tortuous aorta. Scarring lingula. No infiltrate, congestive heart failure or pneumothorax. No plain film evidence of pulmonary malignancy. No acute osseous abnormality. Degenerative changes acromioclavicular joint. IMPRESSION: Cardiomegaly.  Coronary artery calcification. Calcified tortuous aorta. Scarring lingula. Electronically Signed   By: Genia Del M.D.   On: 07/30/2016 09:59    Microbiology: No results found for this or any previous visit (from the past 240 hour(s)).   Labs: Basic Metabolic Panel:  Recent Labs Lab 07/31/16 0435 08/01/16 1019 08/02/16 0613 08/03/16 0445 08/04/16 0705  NA 145 140 141 140 141  K 4.0 4.0 3.9 3.8 4.3  CL 118* 116* 116* 114* 115*  CO2 21* 20* 21* 21* 22  GLUCOSE 113* 238* 106* 105* 94  BUN 41* 35* 27* 17 19  CREATININE 1.12* 1.15* 0.99 0.88 1.12*  CALCIUM 8.1* 7.9* 7.8* 7.8* 8.0*   Liver Function Tests:  Recent Labs Lab 07/31/16 0435  AST 19  ALT 12*  ALKPHOS 40  BILITOT 1.2  PROT 4.9*  ALBUMIN 2.6*   No results for input(s): LIPASE, AMYLASE in the last 168 hours. No results for input(s): AMMONIA in the last 168 hours. CBC:  Recent Labs Lab 07/31/16 0435 08/01/16 1019 08/02/16 0613 08/02/16 1900 08/03/16 0445 08/04/16 0705  WBC 11.2* 8.6 9.6  --  9.4 10.8*  HGB 8.0* 7.2* 10.1*  10.0* 10.7* 10.5* 9.8*  HCT 24.1* 22.8* 31.0*  31.4* 33.2* 32.4* 30.3*  MCV 89.6 93.4 90.5  --  91.3 91.0  PLT 166 172 181  --  199 209   Cardiac Enzymes:  Recent Labs Lab  07/30/16 2040 07/31/16 0032  TROPONINI <0.03 <0.03   BNP: BNP (last 3 results) No results for input(s): BNP in the last 8760 hours.  ProBNP (last 3 results) No results for input(s): PROBNP in the last 8760 hours.  CBG:  Recent Labs Lab 08/03/16 0801 08/03/16 1150 08/03/16 1619 08/03/16 2123 08/04/16 0749  GLUCAP 96 105* 124* 132* 87       Signed:  Zell Hylton N  [facility] 08/04/2016, 11:49 AM

## 2016-08-06 ENCOUNTER — Telehealth: Payer: Self-pay | Admitting: Cardiology

## 2016-08-06 ENCOUNTER — Encounter (HOSPITAL_COMMUNITY): Payer: Self-pay | Admitting: Gastroenterology

## 2016-08-06 NOTE — Telephone Encounter (Signed)
Called patient this a.m., to schedule her 7 to 10 day followup.  I offered 08-20-16 at 8:30 a.m., and the patient bawked at that saying that was way too early. Message sent to Fabian Sharp, [place].

## 2016-08-07 ENCOUNTER — Telehealth: Payer: Self-pay | Admitting: Cardiology

## 2016-08-07 NOTE — Telephone Encounter (Signed)
Called and LVM regarding patients hospital followup on 08-09-16 at 12 noon.

## 2016-08-08 ENCOUNTER — Encounter: Payer: Self-pay | Admitting: Internal Medicine

## 2016-08-08 ENCOUNTER — Ambulatory Visit (INDEPENDENT_AMBULATORY_CARE_PROVIDER_SITE_OTHER): Payer: Medicare HMO | Admitting: Internal Medicine

## 2016-08-08 ENCOUNTER — Other Ambulatory Visit (INDEPENDENT_AMBULATORY_CARE_PROVIDER_SITE_OTHER): Payer: Medicare HMO

## 2016-08-08 VITALS — BP 138/82 | HR 71 | Ht 61.0 in | Wt 139.0 lb

## 2016-08-08 DIAGNOSIS — K2970 Gastritis, unspecified, without bleeding: Secondary | ICD-10-CM

## 2016-08-08 DIAGNOSIS — E118 Type 2 diabetes mellitus with unspecified complications: Secondary | ICD-10-CM

## 2016-08-08 DIAGNOSIS — D508 Other iron deficiency anemias: Secondary | ICD-10-CM | POA: Diagnosis not present

## 2016-08-08 DIAGNOSIS — B029 Zoster without complications: Secondary | ICD-10-CM | POA: Diagnosis not present

## 2016-08-08 LAB — CBC WITH DIFFERENTIAL/PLATELET
BASOS PCT: 0.9 % (ref 0.0–3.0)
Basophils Absolute: 0.1 10*3/uL (ref 0.0–0.1)
EOS ABS: 0.1 10*3/uL (ref 0.0–0.7)
Eosinophils Relative: 1.2 % (ref 0.0–5.0)
HCT: 36.4 % (ref 36.0–46.0)
Hemoglobin: 11.9 g/dL — ABNORMAL LOW (ref 12.0–15.0)
LYMPHS ABS: 1.9 10*3/uL (ref 0.7–4.0)
Lymphocytes Relative: 18 % (ref 12.0–46.0)
MCHC: 32.6 g/dL (ref 30.0–36.0)
MCV: 90.9 fl (ref 78.0–100.0)
MONO ABS: 1.4 10*3/uL — AB (ref 0.1–1.0)
Monocytes Relative: 13.3 % — ABNORMAL HIGH (ref 3.0–12.0)
NEUTROS ABS: 6.9 10*3/uL (ref 1.4–7.7)
NEUTROS PCT: 66.6 % (ref 43.0–77.0)
PLATELETS: 294 10*3/uL (ref 150.0–400.0)
RBC: 4.01 Mil/uL (ref 3.87–5.11)
RDW: 16.5 % — AB (ref 11.5–15.5)
WBC: 10.4 10*3/uL (ref 4.0–10.5)

## 2016-08-08 NOTE — Progress Notes (Signed)
Subjective:    Patient ID: Norma Smith, female    DOB: 12-03-1933, 83 y.o.   MRN: [id number]  HPI  83 y.o.femalewith medical history significant for CLL on remission, DM, Osteoporosis with chronic back pain ,HTN, HLD, CKD, HCM (followed by Dr. Stanford Breed) Iron deficiency anemia, prior history of GIB, and aortic stenosis who presented with 1 mo worsening exertional sob, fatigue, abd pressure and dark stools.  Tx initially with IV prortonix and 2 u PRBC for hgb 5.4.  Pt had EGD and colonoscopy without active bleeding.  EGD had suggestion of abnormal subq nodule, but pt declined EUS.  Metformin held due to renal issue and has several yrs hx of excellent a1c control. Near end of hospn did develop new vesicular rash noted by nursing but not addressed at d/c, is painful vesicular bumps to right mid back/mid thoracic. Past Medical History:  Diagnosis Date  . Allergy   . Anemia   . Aortic stenosis   . Aortic stenosis, moderate 06/16/2011  . Blood transfusion without reported diagnosis 2010?  Marland Kitchen Cardiomyopathy ([place])   . CLL (chronic lymphoblastic leukemia) 12/01/2010  . Diabetes mellitus   . Dry skin dermatitis 12/01/2010  . Hyperlipidemia   . Hypertension   . Iron deficiency 12/01/2010  . Monoclonal B-cell lymphocytosis of unknown significance 12/02/2014  . Osteopenia   . Skin cancer   . Stroke [facility])    1999   Past Surgical History:  Procedure Laterality Date  . APPENDECTOMY    . BREAST BIOPSY  1976  . CATARACT EXTRACTION    . COLONOSCOPY WITH PROPOFOL Left 08/03/2016   Procedure: COLONOSCOPY WITH PROPOFOL;  Surgeon: Otis Brace, MD;  Location: [place];  Service: Gastroenterology;  Laterality: Left;  . ESOPHAGOGASTRODUODENOSCOPY (EGD) WITH PROPOFOL Left 08/03/2016   Procedure: ESOPHAGOGASTRODUODENOSCOPY (EGD) WITH PROPOFOL;  Surgeon: Otis Brace, MD;  Location: [facility];  Service: Gastroenterology;  Laterality: Left;  . LAPAROSCOPIC APPENDECTOMY N/A 12/13/2012   Procedure: APPENDECTOMY LAPAROSCOPIC;  Surgeon: Odis Hollingshead, MD;  Location: [facility];  Service: General;  Laterality: N/A;    reports that she has quit smoking. She has never used smokeless tobacco. She reports that she does not drink alcohol or use drugs. family history includes Cancer in her maternal aunt and other; Diabetes in her mother, other, and other; Heart disease in her maternal aunt and other. Allergies  Allergen Reactions  . Codeine Other (See Comments)    Passed out  . Oxycodone Other (See Comments)    Visual Hallucinations  . Roxanol [Morphine] Hives and Rash  . Penicillins Rash    Has patient had a PCN reaction causing immediate rash, facial/tongue/throat swelling, SOB or lightheadedness with hypotension: Yes Has patient had a PCN reaction causing severe rash involving mucus membranes or skin necrosis: Yes Has patient had a PCN reaction that required hospitalization: No Has patient had a PCN reaction occurring within the last 10 years: No If all of the above answers are "NO", then may proceed with Cephalosporin use.    Current Outpatient Prescriptions on File Prior to Visit  Medication Sig Dispense Refill  . amLODipine (NORVASC) 5 MG tablet TAKE 1 TABLET EVERY DAY 90 tablet 3  . [START ON 08/21/2016] aspirin 81 MG tablet Take 1 tablet (81 mg total) by mouth daily. 30 tablet   . atorvastatin (LIPITOR) 20 MG tablet TAKE 1 TABLET (20 MG TOTAL) BY MOUTH DAILY. 90 tablet 3  . Calcium Carbonate-Vitamin D (CALCIUM-D) 600-400 MG-UNIT TABS Take by mouth 2 (two) times  daily.      . diazepam (VALIUM) 5 MG tablet TAKE 1/2 TABLET BY MOUTH DAILY AS NEEDED FOR STRESS (Patient taking differently: TAKE 1/2 TABLET (2.5MG )  BY MOUTH DAILY ON TUESDAY, THURSDAY, SATURDAY AND SUNDAY) 30 tablet 2  . Ferrous Sulfate (IRON) 325 (65 FE) MG TABS Take 1 tablet by mouth 2 (two) times daily.     Marland Kitchen glucose blood test strip Used to test blood sugar 3 times daily 100 each 12  . irbesartan (AVAPRO) 300  MG tablet TAKE 1 TABLET AT BEDTIME 90 tablet 3  . metoprolol tartrate (LOPRESSOR) 25 MG tablet Take 0.5 tablets (12.5 mg total) by mouth every morning. 90 tablet 2  . Multiple Vitamins-Minerals (CENTRUM SILVER PO) Take 1 tablet by mouth daily.     . pantoprazole (PROTONIX) 40 MG tablet Take 1 tablet (40 mg total) by mouth 2 (two) times daily. 60 tablet 0   No current facility-administered medications on file prior to visit.     Review of Systems  Constitutional: Negative for other unusual diaphoresis or sweats HENT: Negative for ear discharge or swelling Eyes: Negative for other worsening visual disturbances Respiratory: Negative for stridor or other swelling  Gastrointestinal: Negative for worsening distension or other blood Genitourinary: Negative for retention or other urinary change Musculoskeletal: Negative for other MSK pain or swelling Skin: Negative for color change or other new lesions Neurological: Negative for worsening tremors and other numbness  Psychiatric/Behavioral: Negative for worsening agitation or other fatigue All other system neg per pt    Objective:   Physical Exam Blood pressure 138/82, pulse 71, height 5\' 1"  (1.549 m), weight 139 lb (63 kg), SpO2 98 %. VS noted,  Constitutional: Pt appears in NAD HENT: Head: NCAT.  Right Ear: External ear normal.  Left Ear: External ear normal.  Eyes: . Pupils are equal, round, and reactive to light. Conjunctivae and EOM are normal Nose: without d/c or deformity Neck: Neck supple. Gross normal ROM Cardiovascular: Normal rate and regular rhythm.   Pulmonary/Chest: Effort normal and breath sounds without rales or wheezing.  Abd:  Soft, NT, ND, + BS, no organomegaly Neurological: Pt is alert. At baseline orientation, motor grossly intact Skin: Skin is warm. No rashes, other new lesions, no LE edema, right mid back with already scabbing of recent grouped vesicles on erythematous base Psychiatric: Pt behavior is normal without  agitation  No other exam findings  Lab Results  Component Value Date   WBC 10.4 08/08/2016   HGB 11.9 (L) 08/08/2016   HCT 36.4 08/08/2016   PLT 294.0 08/08/2016   GLUCOSE 94 08/04/2016   CHOL 157 03/08/2016   TRIG 85.0 03/08/2016   HDL 56.90 03/08/2016   LDLCALC 83 03/08/2016   ALT 12 (L) 07/31/2016   AST 19 07/31/2016   NA 141 08/04/2016   K 4.3 08/04/2016   CL 115 (H) 08/04/2016   CREATININE 1.12 (H) 08/04/2016   BUN 19 08/04/2016   CO2 22 08/04/2016   TSH 5.75 (H) 03/08/2016   INR 1.06 07/31/2016   HGBA1C 5.3 07/30/2016   MICROALBUR 6.1 (H) 03/08/2016        Assessment & Plan:

## 2016-08-08 NOTE — Patient Instructions (Signed)
OK to stop the protonix after 30 days  Please avoid any persons who have not had Chickenpox or the Varicella vaccine  Please continue all other medications as before, and refills have been done if requested.  Please have the pharmacy call with any other refills you may need.  Please keep your appointments with your specialists as you may have planned  Please go to the LAB in the Basement (turn left off the elevator) for the tests to be done today  You will be contacted by phone if any changes need to be made immediately.  Otherwise, you will receive a letter about your results with an explanation, but please check with MyChart first.  Please remember to sign up for MyChart if you have not done so, as this will be important to you in the future with finding out test results, communicating by private email, and scheduling acute appointments online when needed.  Please return in 3 months for LAB only - the blood count and the A1c  Please return in 6 months, or sooner if needed

## 2016-08-09 ENCOUNTER — Ambulatory Visit: Payer: Medicare HMO | Admitting: Cardiology

## 2016-08-09 ENCOUNTER — Ambulatory Visit: Payer: Medicare HMO | Admitting: Physician Assistant

## 2016-08-11 DIAGNOSIS — B029 Zoster without complications: Secondary | ICD-10-CM | POA: Insufficient documentation

## 2016-08-11 NOTE — Assessment & Plan Note (Signed)
With recent excellent control and renal insufficiency, agree with d/c metformin

## 2016-08-11 NOTE — Assessment & Plan Note (Signed)
For f/u cbc today, o/w stable

## 2016-08-11 NOTE — Assessment & Plan Note (Signed)
Already crusting but may be infectious for several more days, pt declines valtrex, will consider the shingrix later,  to f/u any worsening symptoms or concerns

## 2016-08-11 NOTE — Assessment & Plan Note (Signed)
Pt has no intention of f/u with GI or pursuing the EUS, ok for trial off PPI after 30 days

## 2016-09-11 ENCOUNTER — Ambulatory Visit: Payer: Medicare HMO | Admitting: Internal Medicine

## 2016-09-13 ENCOUNTER — Ambulatory Visit: Payer: Medicare HMO | Admitting: Internal Medicine

## 2016-11-14 ENCOUNTER — Telehealth: Payer: Self-pay | Admitting: Internal Medicine

## 2016-11-14 ENCOUNTER — Other Ambulatory Visit (INDEPENDENT_AMBULATORY_CARE_PROVIDER_SITE_OTHER): Payer: Medicare HMO

## 2016-11-14 DIAGNOSIS — E118 Type 2 diabetes mellitus with unspecified complications: Secondary | ICD-10-CM

## 2016-11-14 DIAGNOSIS — K2970 Gastritis, unspecified, without bleeding: Secondary | ICD-10-CM

## 2016-11-14 DIAGNOSIS — D508 Other iron deficiency anemias: Secondary | ICD-10-CM

## 2016-11-14 LAB — CBC WITH DIFFERENTIAL/PLATELET
Basophils Absolute: 0.1 10*3/uL (ref 0.0–0.1)
Basophils Relative: 1.1 % (ref 0.0–3.0)
EOS PCT: 1.7 % (ref 0.0–5.0)
Eosinophils Absolute: 0.2 10*3/uL (ref 0.0–0.7)
HCT: 30.7 % — ABNORMAL LOW (ref 36.0–46.0)
HEMOGLOBIN: 10.1 g/dL — AB (ref 12.0–15.0)
Lymphocytes Relative: 30.5 % (ref 12.0–46.0)
Lymphs Abs: 2.7 10*3/uL (ref 0.7–4.0)
MCHC: 33 g/dL (ref 30.0–36.0)
MCV: 90.5 fl (ref 78.0–100.0)
MONOS PCT: 8.7 % (ref 3.0–12.0)
Monocytes Absolute: 0.8 10*3/uL (ref 0.1–1.0)
Neutro Abs: 5.1 10*3/uL (ref 1.4–7.7)
Neutrophils Relative %: 58 % (ref 43.0–77.0)
Platelets: 197 10*3/uL (ref 150.0–400.0)
RBC: 3.39 Mil/uL — AB (ref 3.87–5.11)
RDW: 14.3 % (ref 11.5–15.5)
WBC: 8.8 10*3/uL (ref 4.0–10.5)

## 2016-11-14 LAB — HEMOGLOBIN A1C: HEMOGLOBIN A1C: 5.8 % (ref 4.6–6.5)

## 2016-11-14 NOTE — Telephone Encounter (Signed)
metoprolol tartrate (LOPRESSOR) 25 MG tablet  Patient states she has not had this medication refilled in sometimes. She is wanting to know if she is suppose to still be on the medication. If so a new RX needs to be sent to [facility]. Please advise.   Patient is aware Dr.John is out of office.

## 2016-11-16 NOTE — Telephone Encounter (Signed)
Pt has been informed and expressed understanding.  

## 2016-11-16 NOTE — Telephone Encounter (Signed)
She was thought to have been taking this at her last hospn discharge and follow up exam with me;    But if she has not actually been taking this, we can hold off for now as her BP was OK at last visit.  I will adjust the med list. thanks

## 2016-11-18 ENCOUNTER — Other Ambulatory Visit: Payer: Self-pay | Admitting: Internal Medicine

## 2016-11-18 DIAGNOSIS — D649 Anemia, unspecified: Secondary | ICD-10-CM

## 2016-11-21 ENCOUNTER — Telehealth: Payer: Self-pay

## 2016-11-21 NOTE — Telephone Encounter (Signed)
Pt has been informed and expressed understanding.  

## 2016-11-21 NOTE — Telephone Encounter (Signed)
-----   Message from Biagio Borg, MD sent at 11/18/2016  1:11 PM EDT ----- Left message on MyChart, pt to cont same tx except  The test results show that your current treatment is OK, except the hemoglobin is mildly lower.  It seems we should be more proactive and check your blood count every month for now, to make sure is not getting worse again  I will place the order and you should hear from the office about this.  Remember, there is no reminder about doing this, so you will want to mark your calendar and remember to go to the lab once per month.    Ravonda Brecheen to please inform pt, I will do order

## 2016-12-10 ENCOUNTER — Other Ambulatory Visit (INDEPENDENT_AMBULATORY_CARE_PROVIDER_SITE_OTHER): Payer: Medicare HMO

## 2016-12-10 DIAGNOSIS — D649 Anemia, unspecified: Secondary | ICD-10-CM | POA: Diagnosis not present

## 2016-12-10 LAB — CBC WITH DIFFERENTIAL/PLATELET
BASOS ABS: 0.1 10*3/uL (ref 0.0–0.1)
BASOS PCT: 1 % (ref 0.0–3.0)
EOS ABS: 0.2 10*3/uL (ref 0.0–0.7)
Eosinophils Relative: 2 % (ref 0.0–5.0)
HCT: 33.2 % — ABNORMAL LOW (ref 36.0–46.0)
Hemoglobin: 10.8 g/dL — ABNORMAL LOW (ref 12.0–15.0)
LYMPHS PCT: 30.1 % (ref 12.0–46.0)
Lymphs Abs: 2.8 10*3/uL (ref 0.7–4.0)
MCHC: 32.6 g/dL (ref 30.0–36.0)
MCV: 92.3 fl (ref 78.0–100.0)
MONOS PCT: 9 % (ref 3.0–12.0)
Monocytes Absolute: 0.8 10*3/uL (ref 0.1–1.0)
NEUTROS PCT: 57.9 % (ref 43.0–77.0)
Neutro Abs: 5.4 10*3/uL (ref 1.4–7.7)
PLATELETS: 184 10*3/uL (ref 150.0–400.0)
RBC: 3.6 Mil/uL — ABNORMAL LOW (ref 3.87–5.11)
RDW: 14.9 % (ref 11.5–15.5)
WBC: 9.3 10*3/uL (ref 4.0–10.5)

## 2016-12-25 ENCOUNTER — Telehealth: Payer: Self-pay | Admitting: Internal Medicine

## 2016-12-25 NOTE — Telephone Encounter (Signed)
Copied from [place]. Topic: Quick Communication - Other Results >> Dec 25, 2016  8:53 AM Robina Ade, Helene Kelp D wrote: Patient needs to talk to nurse about her lab results and to see if she needs to come back this month for more labs. Please call patient back on her cell phone,thanks.

## 2016-12-25 NOTE — Telephone Encounter (Signed)
Pt has been informed and expressed understanding. She would like to know if blood work would be needed before her appt in January. Please advise. Based on her insurance I didn't know if she was eligible to come in before or was required to have it drawn the day of.

## 2017-01-02 ENCOUNTER — Other Ambulatory Visit: Payer: Self-pay | Admitting: Internal Medicine

## 2017-02-08 ENCOUNTER — Encounter: Payer: Self-pay | Admitting: Internal Medicine

## 2017-02-08 ENCOUNTER — Ambulatory Visit (INDEPENDENT_AMBULATORY_CARE_PROVIDER_SITE_OTHER): Payer: Medicare HMO | Admitting: Internal Medicine

## 2017-02-08 ENCOUNTER — Other Ambulatory Visit (INDEPENDENT_AMBULATORY_CARE_PROVIDER_SITE_OTHER): Payer: Medicare HMO

## 2017-02-08 VITALS — BP 132/78 | HR 75 | Temp 97.6°F | Ht 61.0 in | Wt 146.0 lb

## 2017-02-08 DIAGNOSIS — E118 Type 2 diabetes mellitus with unspecified complications: Secondary | ICD-10-CM

## 2017-02-08 DIAGNOSIS — I350 Nonrheumatic aortic (valve) stenosis: Secondary | ICD-10-CM

## 2017-02-08 DIAGNOSIS — Z0000 Encounter for general adult medical examination without abnormal findings: Secondary | ICD-10-CM | POA: Diagnosis not present

## 2017-02-08 DIAGNOSIS — D509 Iron deficiency anemia, unspecified: Secondary | ICD-10-CM

## 2017-02-08 DIAGNOSIS — E559 Vitamin D deficiency, unspecified: Secondary | ICD-10-CM

## 2017-02-08 DIAGNOSIS — E785 Hyperlipidemia, unspecified: Secondary | ICD-10-CM | POA: Diagnosis not present

## 2017-02-08 DIAGNOSIS — I421 Obstructive hypertrophic cardiomyopathy: Secondary | ICD-10-CM

## 2017-02-08 DIAGNOSIS — I10 Essential (primary) hypertension: Secondary | ICD-10-CM | POA: Diagnosis not present

## 2017-02-08 LAB — LIPID PANEL
CHOL/HDL RATIO: 2
CHOLESTEROL: 168 mg/dL (ref 0–200)
HDL: 67.9 mg/dL (ref >39.00)
LDL Cholesterol: 94 mg/dL (ref 0–99)
NonHDL: 100
TRIGLYCERIDES: 31 mg/dL (ref 0.0–149.0)
VLDL: 6.2 mg/dL (ref 0.0–40.0)

## 2017-02-08 LAB — HEMOGLOBIN A1C: Hgb A1c MFr Bld: 5.4 % (ref 4.6–6.5)

## 2017-02-08 LAB — CBC WITH DIFFERENTIAL/PLATELET
Basophils Absolute: 0.1 10*3/uL (ref 0.0–0.1)
Basophils Relative: 0.8 % (ref 0.0–3.0)
EOS PCT: 2.1 % (ref 0.0–5.0)
Eosinophils Absolute: 0.2 10*3/uL (ref 0.0–0.7)
HCT: 30.9 % — ABNORMAL LOW (ref 36.0–46.0)
Hemoglobin: 10.3 g/dL — ABNORMAL LOW (ref 12.0–15.0)
LYMPHS ABS: 2.9 10*3/uL (ref 0.7–4.0)
Lymphocytes Relative: 33.9 % (ref 12.0–46.0)
MCHC: 33.2 g/dL (ref 30.0–36.0)
MCV: 93.8 fl (ref 78.0–100.0)
MONOS PCT: 6.5 % (ref 3.0–12.0)
Monocytes Absolute: 0.6 10*3/uL (ref 0.1–1.0)
NEUTROS PCT: 56.7 % (ref 43.0–77.0)
Neutro Abs: 4.9 10*3/uL (ref 1.4–7.7)
Platelets: 220 10*3/uL (ref 150.0–400.0)
RBC: 3.3 Mil/uL — AB (ref 3.87–5.11)
RDW: 14.7 % (ref 11.5–15.5)
WBC: 8.7 10*3/uL (ref 4.0–10.5)

## 2017-02-08 LAB — URINALYSIS, ROUTINE W REFLEX MICROSCOPIC
Bilirubin Urine: NEGATIVE
KETONES UR: NEGATIVE
LEUKOCYTES UA: NEGATIVE
Nitrite: NEGATIVE
PH: 6 (ref 5.0–8.0)
Total Protein, Urine: NEGATIVE
URINE GLUCOSE: NEGATIVE
UROBILINOGEN UA: 0.2 (ref 0.0–1.0)

## 2017-02-08 LAB — MICROALBUMIN / CREATININE URINE RATIO
CREATININE, U: 12.5 mg/dL
Microalb Creat Ratio: 63.9 mg/g — ABNORMAL HIGH (ref 0.0–30.0)
Microalb, Ur: 8 mg/dL — ABNORMAL HIGH (ref 0.0–1.9)

## 2017-02-08 LAB — IBC PANEL
Iron: 96 ug/dL (ref 42–145)
SATURATION RATIOS: 27.2 % (ref 20.0–50.0)
Transferrin: 252 mg/dL (ref 212.0–360.0)

## 2017-02-08 LAB — HEPATIC FUNCTION PANEL
ALBUMIN: 4.1 g/dL (ref 3.5–5.2)
ALT: 16 U/L (ref 0–35)
AST: 24 U/L (ref 0–37)
Alkaline Phosphatase: 60 U/L (ref 39–117)
Bilirubin, Direct: 0.1 mg/dL (ref 0.0–0.3)
TOTAL PROTEIN: 7 g/dL (ref 6.0–8.3)
Total Bilirubin: 0.4 mg/dL (ref 0.2–1.2)

## 2017-02-08 LAB — BASIC METABOLIC PANEL
BUN: 37 mg/dL — ABNORMAL HIGH (ref 6–23)
CALCIUM: 9.7 mg/dL (ref 8.4–10.5)
CO2: 31 meq/L (ref 19–32)
Chloride: 105 mEq/L (ref 96–112)
Creatinine, Ser: 1.13 mg/dL (ref 0.40–1.20)
GFR: 49.07 mL/min — ABNORMAL LOW (ref >60.00)
Glucose, Bld: 114 mg/dL — ABNORMAL HIGH (ref 70–99)
Potassium: 4.5 mEq/L (ref 3.5–5.1)
SODIUM: 143 meq/L (ref 135–145)

## 2017-02-08 LAB — VITAMIN D 25 HYDROXY (VIT D DEFICIENCY, FRACTURES): VITD: 42.39 ng/mL (ref 30.00–100.00)

## 2017-02-08 LAB — BRAIN NATRIURETIC PEPTIDE: PRO B NATRI PEPTIDE: 690 pg/mL — AB (ref 0.0–100.0)

## 2017-02-08 LAB — TSH: TSH: 5.07 u[IU]/mL — ABNORMAL HIGH (ref 0.35–4.50)

## 2017-02-08 MED ORDER — METOPROLOL SUCCINATE ER 25 MG PO TB24: 12.5000 mg | ORAL_TABLET | Freq: Every day | ORAL | 3 refills | Status: DC

## 2017-02-08 NOTE — Progress Notes (Signed)
Subjective:    Patient ID: Norma Smith, female    DOB: 1933-09-05, 84 y.o.   MRN: [id number]  HPI Here for wellness and f/u;  Overall doing ok;  Pt denies Chest pain, worsening SOB, DOE, wheezing, orthopnea, PND, worsening LE edema, palpitations, dizziness or syncope.  Pt denies neurological change such as new headache, facial or extremity weakness.  Pt denies polydipsia, polyuria, or low sugar symptoms. Pt states overall good compliance with treatment and medications, good tolerability, and has been trying to follow appropriate diet.  Pt denies worsening depressive symptoms, suicidal ideation or panic. No fever, night sweats, wt loss, loss of appetite, or other constitutional symptoms.  Pt states good ability with ADL's, has low fall risk, home safety reviewed and adequate, no other significant changes in hearing or vision, and not active with exercise. Not taking the metformin soine July hospn where she was advised she could stop per hospitalist.   Has been out of metoprolol xr 25 1/2 qd, recently, questions whether really needs to take, and has not f/u with cardiology recently. Plans to make her own optho f/u appt, Declines Tdap Past Medical History:  Diagnosis Date  . Allergy   . Anemia   . Aortic stenosis   . Aortic stenosis, moderate 06/16/2011  . Blood transfusion without reported diagnosis 2010?  Marland Kitchen Cardiomyopathy ([place])   . CLL (chronic lymphoblastic leukemia) 12/01/2010  . Diabetes mellitus   . Dry skin dermatitis 12/01/2010  . Hyperlipidemia   . Hypertension   . Iron deficiency 12/01/2010  . Monoclonal B-cell lymphocytosis of unknown significance 12/02/2014  . Osteopenia   . Skin cancer   . Stroke [facility])    1999   Past Surgical History:  Procedure Laterality Date  . APPENDECTOMY    . BREAST BIOPSY  1976  . CATARACT EXTRACTION    . COLONOSCOPY WITH PROPOFOL Left 08/03/2016   Procedure: COLONOSCOPY WITH PROPOFOL;  Surgeon: Otis Brace, MD;  Location: [place];   Service: Gastroenterology;  Laterality: Left;  . ESOPHAGOGASTRODUODENOSCOPY (EGD) WITH PROPOFOL Left 08/03/2016   Procedure: ESOPHAGOGASTRODUODENOSCOPY (EGD) WITH PROPOFOL;  Surgeon: Otis Brace, MD;  [facility];  Service: Gastroenterology;  Laterality: Left;  . LAPAROSCOPIC APPENDECTOMY N/A 12/13/2012   Procedure: APPENDECTOMY LAPAROSCOPIC;  Surgeon: Odis Hollingshead, MD;  Location: [facility];  Service: General;  Laterality: N/A;    reports that she has quit smoking. she has never used smokeless tobacco. She reports that she does not drink alcohol or use drugs. family history includes Cancer in her maternal aunt and other; Diabetes in her mother, other, and other; Heart disease in her maternal aunt and other. Allergies  Allergen Reactions  . Codeine Other (See Comments)    Passed out  . Oxycodone Other (See Comments)    Visual Hallucinations  . Roxanol [Morphine] Hives and Rash  . Penicillins Rash    Has patient had a PCN reaction causing immediate rash, facial/tongue/throat swelling, SOB or lightheadedness with hypotension: Yes Has patient had a PCN reaction causing severe rash involving mucus membranes or skin necrosis: Yes Has patient had a PCN reaction that required hospitalization: No Has patient had a PCN reaction occurring within the last 10 years: No If all of the above answers are "NO", then may proceed with Cephalosporin use.    Current Outpatient Medications on File Prior to Visit  Medication Sig Dispense Refill  . amLODipine (NORVASC) 5 MG tablet TAKE 1 TABLET EVERY DAY 90 tablet 3  . aspirin 81 MG tablet Take  1 tablet (81 mg total) by mouth daily. 30 tablet   . atorvastatin (LIPITOR) 20 MG tablet TAKE 1 TABLET EVERY DAY 90 tablet 3  . Calcium Carbonate-Vitamin D (CALCIUM-D) 600-400 MG-UNIT TABS Take by mouth 2 (two) times daily.      . diazepam (VALIUM) 5 MG tablet TAKE 1/2 TABLET BY MOUTH DAILY AS NEEDED FOR STRESS (Patient taking differently: TAKE 1/2  TABLET (2.5MG )  BY MOUTH DAILY ON TUESDAY, THURSDAY, SATURDAY AND SUNDAY) 30 tablet 2  . Ferrous Sulfate (IRON) 325 (65 FE) MG TABS Take 1 tablet by mouth 2 (two) times daily.     . furosemide (LASIX) 40 MG tablet TAKE 1 TABLET EVERY DAY 90 tablet 3  . glucose blood test strip Used to test blood sugar 3 times daily 100 each 12  . irbesartan (AVAPRO) 300 MG tablet TAKE 1 TABLET AT BEDTIME 90 tablet 3  . Multiple Vitamins-Minerals (CENTRUM SILVER PO) Take 1 tablet by mouth daily.     . pantoprazole (PROTONIX) 40 MG tablet Take 1 tablet (40 mg total) by mouth 2 (two) times daily. 60 tablet 0  . pioglitazone (ACTOS) 15 MG tablet TAKE 1 TABLET EVERY DAY 90 tablet 3   No current facility-administered medications on file prior to visit.    Review of Systems Constitutional: Negative for other unusual diaphoresis, sweats, appetite or weight changes HENT: Negative for other worsening hearing loss, ear pain, facial swelling, mouth sores or neck stiffness.   Eyes: Negative for other worsening pain, redness or other visual disturbance.  Respiratory: Negative for other stridor or swelling Cardiovascular: Negative for other palpitations or other chest pain  Gastrointestinal: Negative for worsening diarrhea or loose stools, blood in stool, distention or other pain Genitourinary: Negative for hematuria, flank pain or other change in urine volume.  Musculoskeletal: Negative for myalgias or other joint swelling.  Skin: Negative for other color change, or other wound or worsening drainage.  Neurological: Negative for other syncope or numbness. Hematological: Negative for other adenopathy or swelling Psychiatric/Behavioral: Negative for hallucinations, other worsening agitation, SI, self-injury, or new decreased concentration All other system neg per pt    Objective:   Physical Exam BP 132/78   Pulse 75   Temp 97.6 F (36.4 C) (Oral)   Ht 5\' 1"  (1.549 m)   Wt 146 lb (66.2 kg)   SpO2 98%   BMI 27.59  kg/m  VS noted,  Constitutional: Pt is oriented to person, place, and time. Appears well-developed and well-nourished, in no significant distress and comfortable Head: Normocephalic and atraumatic  Eyes: Conjunctivae and EOM are normal. Pupils are equal, round, and reactive to light Right Ear: External ear normal without discharge Left Ear: External ear normal without discharge Nose: Nose without discharge or deformity Mouth/Throat: Oropharynx is without other ulcerations and moist  Neck: Normal range of motion. Neck supple. No JVD present. No tracheal deviation present or significant neck LA or mass Cardiovascular: Normal rate, regular rhythm, normal heart sounds except 3/6 RUSB murmur, and intact distal pulses.   Pulmonary/Chest: WOB normal and breath sounds without rales or wheezing  Abdominal: Soft. Bowel sounds are normal. NT. No HSM  Musculoskeletal: Normal range of motion. Exhibits no edema Lymphadenopathy: Has no other cervical adenopathy.  Neurological: Pt is alert and oriented to person, place, and time. Pt has normal reflexes. No cranial nerve deficit. Motor grossly intact, Gait intact Skin: Skin is warm and dry. No rash noted or new ulcerations Psychiatric:  Has nervous mood and affect. Behavior is  normal without agitation No other exam findings    Assessment & Plan:

## 2017-02-08 NOTE — Patient Instructions (Signed)
Please continue all other medications as before, and refills have been done if requested, including restarting the metoprolol 25 mg - 1/2 pill per day  Please have the pharmacy call with any other refills you may need.  Please continue your efforts at being more active, low cholesterol diet, and weight control.  You are otherwise up to date with prevention measures today.  Please keep your appointments with your specialists as you may have planned  You will be contacted regarding the referral for: Cardiology  Please go to the LAB in the Basement (turn left off the elevator) for the tests to be done today  You will be contacted by phone if any changes need to be made immediately.  Otherwise, you will receive a letter about your results with an explanation, but please check with MyChart first.  Please remember to sign up for MyChart if you have not done so, as this will be important to you in the future with finding out test results, communicating by private email, and scheduling acute appointments online when needed.  Please return in 6 months, or sooner if needed, with Lab testing done 3-5 days before

## 2017-02-09 ENCOUNTER — Encounter: Payer: Self-pay | Admitting: Internal Medicine

## 2017-02-09 NOTE — Assessment & Plan Note (Signed)
Lab Results  Component Value Date   HGBA1C 5.4 02/08/2017  stable overall by history and exam, recent data reviewed with pt, and pt to continue medical treatment as before,  to f/u any worsening symptoms or concerns

## 2017-02-09 NOTE — Assessment & Plan Note (Signed)

## 2017-02-09 NOTE — Assessment & Plan Note (Signed)
Due for follow up with cardiology  - will refer, also restart very low dose BB

## 2017-02-09 NOTE — Assessment & Plan Note (Signed)
Symptomatically apparently doing  Ok, also for card f/u

## 2017-02-09 NOTE — Assessment & Plan Note (Signed)
stable overall by history and exam, recent data reviewed with pt, and pt to continue medical treatment as before,  to f/u any worsening symptoms or concerns BP Readings from Last 3 Encounters:  02/08/17 132/78  08/08/16 138/82  08/04/16 (!) 174/64

## 2017-02-09 NOTE — Assessment & Plan Note (Addendum)
Lab Results  Component Value Date   LDLCALC 94 02/08/2017  goal < 70, for lower chol diet, declines change in tx with increased statin

## 2017-02-09 NOTE — Assessment & Plan Note (Signed)
Lab Results  Component Value Date   HGB 10.3 (L) 02/08/2017  stable overall by history and exam, recent data reviewed with pt, and pt to continue medical treatment as before,  to f/u any worsening symptoms or concerns

## 2017-08-08 ENCOUNTER — Ambulatory Visit: Payer: Medicare HMO | Admitting: Internal Medicine

## 2017-08-20 ENCOUNTER — Encounter: Payer: Self-pay | Admitting: Internal Medicine

## 2017-08-20 ENCOUNTER — Ambulatory Visit (INDEPENDENT_AMBULATORY_CARE_PROVIDER_SITE_OTHER): Payer: Medicare HMO | Admitting: Internal Medicine

## 2017-08-20 VITALS — BP 132/86 | HR 61 | Temp 98.4°F | Ht 61.0 in | Wt 156.0 lb

## 2017-08-20 DIAGNOSIS — N183 Chronic kidney disease, stage 3 unspecified: Secondary | ICD-10-CM

## 2017-08-20 DIAGNOSIS — E038 Other specified hypothyroidism: Secondary | ICD-10-CM

## 2017-08-20 DIAGNOSIS — E785 Hyperlipidemia, unspecified: Secondary | ICD-10-CM

## 2017-08-20 DIAGNOSIS — E039 Hypothyroidism, unspecified: Secondary | ICD-10-CM

## 2017-08-20 DIAGNOSIS — F419 Anxiety disorder, unspecified: Secondary | ICD-10-CM

## 2017-08-20 DIAGNOSIS — E118 Type 2 diabetes mellitus with unspecified complications: Secondary | ICD-10-CM | POA: Diagnosis not present

## 2017-08-20 DIAGNOSIS — I10 Essential (primary) hypertension: Secondary | ICD-10-CM | POA: Diagnosis not present

## 2017-08-20 LAB — POCT GLYCOSYLATED HEMOGLOBIN (HGB A1C): HEMOGLOBIN A1C: 6 % — AB (ref 4.0–5.6)

## 2017-08-20 MED ORDER — DIAZEPAM 5 MG PO TABS: ORAL_TABLET | ORAL | 5 refills | Status: DC

## 2017-08-20 NOTE — Patient Instructions (Signed)
Your A1c was normal  Please continue all other medications as before, and refills have been done if requested.  Please have the pharmacy call with any other refills you may need.  Please continue your efforts at being more active, low cholesterol diet, and weight control.  You are otherwise up to date with prevention measures today.  Please keep your appointments with your specialists as you may have planned  Please return in 6 months, or sooner if needed, with Lab testing done 3-5 days before

## 2017-08-20 NOTE — Assessment & Plan Note (Signed)
stable overall by history and exam, recent data reviewed with pt, and pt to continue medical treatment as before,  to f/u any worsening symptoms or concerns Lab Results  Component Value Date   CREATININE 1.13 02/08/2017

## 2017-08-20 NOTE — Assessment & Plan Note (Signed)
stable overall by history and exam, recent data reviewed with pt, and pt to continue medical treatment as before,  to f/u any worsening symptoms or concerns  

## 2017-08-20 NOTE — Assessment & Plan Note (Signed)
Lab Results  Component Value Date   HGBA1C 6.0 (A) 08/20/2017   stable overall by history and exam, recent data reviewed with pt, and pt to continue medical treatment as before,  to f/u any worsening symptoms or concerns

## 2017-08-20 NOTE — Progress Notes (Addendum)
Subjective:    Patient ID: Norma Smith, female    DOB: [birth year], 84 y.o.   MRN: [id number]  HPI  Here to f/u; overall doing ok,  Pt denies chest pain, increasing sob or doe, wheezing, orthopnea, PND, increased LE swelling, palpitations, dizziness or syncope.  Pt denies new neurological symptoms such as new headache, or facial or extremity weakness or numbness.  Pt denies polydipsia, polyuria, or low sugar episode.  Pt states overall good compliance with meds, mostly trying to follow appropriate diet, with wt overall stable.  No new complaints Past Medical History:  Diagnosis Date  . Allergy   . Anemia   . Aortic stenosis   . Aortic stenosis, moderate 06/16/2011  . Blood transfusion without reported diagnosis 2010?  Marland Kitchen Cardiomyopathy ([place])   . CLL (chronic lymphoblastic leukemia) 12/01/2010  . Diabetes mellitus   . Dry skin dermatitis 12/01/2010  . Hyperlipidemia   . Hypertension   . Iron deficiency 12/01/2010  . Monoclonal B-cell lymphocytosis of unknown significance 12/02/2014  . Osteopenia   . Skin cancer   . Stroke [facility])    1999   Past Surgical History:  Procedure Laterality Date  . APPENDECTOMY    . BREAST BIOPSY  1976  . CATARACT EXTRACTION    . COLONOSCOPY WITH PROPOFOL Left 08/03/2016   Procedure: COLONOSCOPY WITH PROPOFOL;  Surgeon: Otis Brace, MD;  Location: [place];  Service: Gastroenterology;  Laterality: Left;  . ESOPHAGOGASTRODUODENOSCOPY (EGD) WITH PROPOFOL Left 08/03/2016   Procedure: ESOPHAGOGASTRODUODENOSCOPY (EGD) WITH PROPOFOL;  Surgeon: Otis Brace, MD;  [facility];  Service: Gastroenterology;  Laterality: Left;  . LAPAROSCOPIC APPENDECTOMY N/A 12/13/2012   Procedure: APPENDECTOMY LAPAROSCOPIC;  Surgeon: Odis Hollingshead, MD;  Location: [facility];  Service: General;  Laterality: N/A;    reports that she has quit smoking. She has never used smokeless tobacco. She reports that she does not drink alcohol or use drugs. family history  includes Cancer in her maternal aunt and other; Diabetes in her mother, other, and other; Heart disease in her maternal aunt and other. Allergies  Allergen Reactions  . Codeine Other (See Comments)    Passed out  . Oxycodone Other (See Comments)    Visual Hallucinations  . Roxanol [Morphine] Hives and Rash  . Penicillins Rash    Has patient had a PCN reaction causing immediate rash, facial/tongue/throat swelling, SOB or lightheadedness with hypotension: Yes Has patient had a PCN reaction causing severe rash involving mucus membranes or skin necrosis: Yes Has patient had a PCN reaction that required hospitalization: No Has patient had a PCN reaction occurring within the last 10 years: No If all of the above answers are "NO", then may proceed with Cephalosporin use.    Current Outpatient Medications on File Prior to Visit  Medication Sig Dispense Refill  . amLODipine (NORVASC) 5 MG tablet TAKE 1 TABLET EVERY DAY 90 tablet 3  . aspirin 81 MG tablet Take 1 tablet (81 mg total) by mouth daily. 30 tablet   . atorvastatin (LIPITOR) 20 MG tablet TAKE 1 TABLET EVERY DAY 90 tablet 3  . Calcium Carbonate-Vitamin D (CALCIUM-D) 600-400 MG-UNIT TABS Take by mouth 2 (two) times daily.      . Ferrous Sulfate (IRON) 325 (65 FE) MG TABS Take 1 tablet by mouth 2 (two) times daily.     . furosemide (LASIX) 40 MG tablet TAKE 1 TABLET EVERY DAY 90 tablet 3  . glucose blood test strip Used to test blood sugar 3 times daily  100 each 12  . irbesartan (AVAPRO) 300 MG tablet TAKE 1 TABLET AT BEDTIME 90 tablet 3  . metoprolol succinate (TOPROL-XL) 25 MG 24 hr tablet Take 0.5 tablets (12.5 mg total) by mouth daily. 45 tablet 3  . Multiple Vitamins-Minerals (CENTRUM SILVER PO) Take 1 tablet by mouth daily.     . pantoprazole (PROTONIX) 40 MG tablet Take 1 tablet (40 mg total) by mouth 2 (two) times daily. 60 tablet 0   No current facility-administered medications on file prior to visit.    Review of Systems   Constitutional: Negative for other unusual diaphoresis or sweats HENT: Negative for ear discharge or swelling Eyes: Negative for other worsening visual disturbances Respiratory: Negative for stridor or other swelling  Gastrointestinal: Negative for worsening distension or other blood Genitourinary: Negative for retention or other urinary change Musculoskeletal: Negative for other MSK pain or swelling Skin: Negative for color change or other new lesions Neurological: Negative for worsening tremors and other numbness  Psychiatric/Behavioral: Negative for worsening agitation or other fatigue All other system neg per pt    Objective:   Physical Exam BP 132/86   Pulse 61   Temp 98.4 F (36.9 C) (Oral)   Ht 5\' 1"  (1.549 m)   Wt 156 lb (70.8 kg)   SpO2 93%   BMI 29.48 kg/m  VS noted,  Constitutional: Pt appears in NAD HENT: Head: NCAT.  Right Ear: External ear normal.  Left Ear: External ear normal.  Eyes: . Pupils are equal, round, and reactive to light. Conjunctivae and EOM are normal Nose: without d/c or deformity Neck: Neck supple. Gross normal ROM Cardiovascular: Normal rate and regular rhythm.   Pulmonary/Chest: Effort normal and breath sounds without rales or wheezing.  Neurological: Pt is alert. At baseline orientation, motor grossly intact Skin: Skin is warm. No rashes, other new lesions, no LE edema Psychiatric: Pt behavior is normal without agitation  No other exam findings Lab Results  Component Value Date   WBC 8.7 02/08/2017   HGB 10.3 (L) 02/08/2017   HCT 30.9 (L) 02/08/2017   PLT 220.0 02/08/2017   GLUCOSE 114 (H) 02/08/2017   CHOL 168 02/08/2017   TRIG 31.0 02/08/2017   HDL 67.90 02/08/2017   LDLCALC 94 02/08/2017   ALT 16 02/08/2017   AST 24 02/08/2017   NA 143 02/08/2017   K 4.5 02/08/2017   CL 105 02/08/2017   CREATININE 1.13 02/08/2017   BUN 37 (H) 02/08/2017   CO2 31 02/08/2017   TSH 5.07 (H) 02/08/2017   INR 1.06 07/31/2016   HGBA1C 6.0 (A)  08/20/2017   MICROALBUR 8.0 (H) 02/08/2017   POCT glycosylated hemoglobin (Hb A1C)  Order: [id number]  Status:  Final result Visible to patient:  No (Not Released) Dx:  Type 2 diabetes mellitus with complic...   Ref Range & Units 1d ago 33mo ago 3mo ago 22yr ago  Hemoglobin A1C 4.0 - 5.6 % 6.0Abnormal   5.4 R, CM 5.8 R, CM 5.3 R, CM           Assessment & Plan:

## 2017-08-20 NOTE — Assessment & Plan Note (Signed)
Mild, asympt, declines thyroid replacement

## 2017-08-20 NOTE — Assessment & Plan Note (Signed)
Stable, cont valium prn

## 2017-11-06 ENCOUNTER — Other Ambulatory Visit: Payer: Self-pay | Admitting: Internal Medicine

## 2018-02-18 ENCOUNTER — Other Ambulatory Visit: Payer: Self-pay | Admitting: Internal Medicine

## 2018-02-20 ENCOUNTER — Other Ambulatory Visit (INDEPENDENT_AMBULATORY_CARE_PROVIDER_SITE_OTHER): Payer: Medicare HMO

## 2018-02-20 DIAGNOSIS — E118 Type 2 diabetes mellitus with unspecified complications: Secondary | ICD-10-CM | POA: Diagnosis not present

## 2018-02-20 LAB — LIPID PANEL
CHOLESTEROL: 237 mg/dL — AB (ref 0–200)
HDL: 53.5 mg/dL (ref >39.00)
LDL Cholesterol: 163 mg/dL — ABNORMAL HIGH (ref 0–99)
NonHDL: 183.49
Total CHOL/HDL Ratio: 4
Triglycerides: 102 mg/dL (ref 0.0–149.0)
VLDL: 20.4 mg/dL (ref 0.0–40.0)

## 2018-02-20 LAB — URINALYSIS, ROUTINE W REFLEX MICROSCOPIC
Bilirubin Urine: NEGATIVE
KETONES UR: NEGATIVE
Leukocytes, UA: NEGATIVE
Nitrite: NEGATIVE
Specific Gravity, Urine: 1.02 (ref 1.000–1.030)
Total Protein, Urine: 100 — AB
Urine Glucose: NEGATIVE
Urobilinogen, UA: 0.2 (ref 0.0–1.0)
pH: 6 (ref 5.0–8.0)

## 2018-02-20 LAB — CBC WITH DIFFERENTIAL/PLATELET
Basophils Absolute: 0.1 10*3/uL (ref 0.0–0.1)
Basophils Relative: 1 % (ref 0.0–3.0)
Eosinophils Absolute: 0.2 10*3/uL (ref 0.0–0.7)
Eosinophils Relative: 2.4 % (ref 0.0–5.0)
HCT: 36.6 % (ref 36.0–46.0)
Hemoglobin: 12.1 g/dL (ref 12.0–15.0)
Lymphocytes Relative: 35.5 % (ref 12.0–46.0)
Lymphs Abs: 3.1 10*3/uL (ref 0.7–4.0)
MCHC: 33 g/dL (ref 30.0–36.0)
MCV: 92.6 fl (ref 78.0–100.0)
MONO ABS: 0.7 10*3/uL (ref 0.1–1.0)
Monocytes Relative: 8.4 % (ref 3.0–12.0)
Neutro Abs: 4.6 10*3/uL (ref 1.4–7.7)
Neutrophils Relative %: 52.7 % (ref 43.0–77.0)
Platelets: 185 10*3/uL (ref 150.0–400.0)
RBC: 3.95 Mil/uL (ref 3.87–5.11)
RDW: 13.5 % (ref 11.5–15.5)
WBC: 8.7 10*3/uL (ref 4.0–10.5)

## 2018-02-20 LAB — HEPATIC FUNCTION PANEL
ALT: 15 U/L (ref 0–35)
AST: 22 U/L (ref 0–37)
Albumin: 3.4 g/dL — ABNORMAL LOW (ref 3.5–5.2)
Alkaline Phosphatase: 61 U/L (ref 39–117)
Bilirubin, Direct: 0.1 mg/dL (ref 0.0–0.3)
Total Bilirubin: 0.5 mg/dL (ref 0.2–1.2)
Total Protein: 5.7 g/dL — ABNORMAL LOW (ref 6.0–8.3)

## 2018-02-20 LAB — BASIC METABOLIC PANEL
BUN: 29 mg/dL — ABNORMAL HIGH (ref 6–23)
CO2: 30 mEq/L (ref 19–32)
Calcium: 8.8 mg/dL (ref 8.4–10.5)
Chloride: 105 mEq/L (ref 96–112)
Creatinine, Ser: 1.05 mg/dL (ref 0.40–1.20)
GFR: 50.12 mL/min — ABNORMAL LOW (ref >60.00)
Glucose, Bld: 116 mg/dL — ABNORMAL HIGH (ref 70–99)
Potassium: 4.4 mEq/L (ref 3.5–5.1)
Sodium: 143 mEq/L (ref 135–145)

## 2018-02-20 LAB — MICROALBUMIN / CREATININE URINE RATIO
Creatinine,U: 109.1 mg/dL
Microalb Creat Ratio: 77.1 mg/g — ABNORMAL HIGH (ref 0.0–30.0)
Microalb, Ur: 84.1 mg/dL — ABNORMAL HIGH (ref 0.0–1.9)

## 2018-02-20 LAB — HEMOGLOBIN A1C: Hgb A1c MFr Bld: 6.5 % (ref 4.6–6.5)

## 2018-02-20 LAB — TSH: TSH: 8.15 u[IU]/mL — ABNORMAL HIGH (ref 0.35–4.50)

## 2018-02-24 ENCOUNTER — Encounter: Payer: Self-pay | Admitting: Internal Medicine

## 2018-02-24 ENCOUNTER — Ambulatory Visit (INDEPENDENT_AMBULATORY_CARE_PROVIDER_SITE_OTHER): Payer: Medicare HMO | Admitting: Internal Medicine

## 2018-02-24 DIAGNOSIS — Z0000 Encounter for general adult medical examination without abnormal findings: Secondary | ICD-10-CM

## 2018-02-24 DIAGNOSIS — E039 Hypothyroidism, unspecified: Secondary | ICD-10-CM

## 2018-02-24 DIAGNOSIS — E119 Type 2 diabetes mellitus without complications: Secondary | ICD-10-CM | POA: Diagnosis not present

## 2018-02-24 DIAGNOSIS — E038 Other specified hypothyroidism: Secondary | ICD-10-CM

## 2018-02-24 DIAGNOSIS — R3121 Asymptomatic microscopic hematuria: Secondary | ICD-10-CM | POA: Diagnosis not present

## 2018-02-24 DIAGNOSIS — E785 Hyperlipidemia, unspecified: Secondary | ICD-10-CM | POA: Diagnosis not present

## 2018-02-24 MED ORDER — LEVOTHYROXINE SODIUM 25 MCG PO TABS: 25.0000 ug | ORAL_TABLET | Freq: Every day | ORAL | 3 refills | Status: DC

## 2018-02-24 NOTE — Assessment & Plan Note (Signed)
With increased TSH  - for start levothyroxin 25

## 2018-02-24 NOTE — Assessment & Plan Note (Signed)

## 2018-02-24 NOTE — Patient Instructions (Signed)
Please take all new medication as prescribed - the low dose thyroid medication  Please continue all other medications as before, and refills have been done if requested, including the statin for high cholesterol  Please have the pharmacy call with any other refills you may need.  Please continue your efforts at being more active, low cholesterol diet, and weight control.  You are otherwise up to date with prevention measures today.  Please keep your appointments with your specialists as you may have planned  Please return in 6 months, or sooner if needed, with Lab testing done 3-5 days before

## 2018-02-24 NOTE — Progress Notes (Signed)
Subjective:    Patient ID: Norma Smith, female    DOB: Feb 16, 1933, 85 y.o.   MRN: [id number]  HPI  Here for wellness and f/u;  Overall doing ok;  Pt denies Chest pain, worsening SOB, DOE, wheezing, orthopnea, PND, worsening LE edema, palpitations, dizziness or syncope.  Pt denies neurological change such as new headache, facial or extremity weakness.  Pt denies polydipsia, polyuria, or low sugar symptoms. Pt states overall good compliance with treatment and medications, good tolerability, and has been trying to follow appropriate diet.  Pt denies worsening depressive symptoms, suicidal ideation or panic. No fever, night sweats, wt loss, loss of appetite, or other constitutional symptoms.  Pt states good ability with ADL's, has low fall risk, home safety reviewed and adequate, no other significant changes in hearing or vision, and only occasionally active with exercise.  No new complaints Denies urinary symptoms such as dysuria, frequency, urgency, flank pain, hematuria or n/v, fever, chills. Denies hyper or hypo thyroid symptoms such as voice, skin or hair change.  Past Medical History:  Diagnosis Date  . Allergy   . Anemia   . Aortic stenosis   . Aortic stenosis, moderate 06/16/2011  . Blood transfusion without reported diagnosis 2010?  Marland Kitchen Cardiomyopathy ([place])   . CLL (chronic lymphoblastic leukemia) 12/01/2010  . Diabetes mellitus   . Dry skin dermatitis 12/01/2010  . Hyperlipidemia   . Hypertension   . Iron deficiency 12/01/2010  . Monoclonal B-cell lymphocytosis of unknown significance 12/02/2014  . Osteopenia   . Skin cancer   . Stroke [facility])    1999   Past Surgical History:  Procedure Laterality Date  . APPENDECTOMY    . BREAST BIOPSY  1976  . CATARACT EXTRACTION    . COLONOSCOPY WITH PROPOFOL Left 08/03/2016   Procedure: COLONOSCOPY WITH PROPOFOL;  Surgeon: Otis Brace, MD;  Location: [place];  Service: Gastroenterology;  Laterality: Left;  . ESOPHAGOGASTRODUODENOSCOPY  (EGD) WITH PROPOFOL Left 08/03/2016   Procedure: ESOPHAGOGASTRODUODENOSCOPY (EGD) WITH PROPOFOL;  Surgeon: Otis Brace, MD;  [facility];  Service: Gastroenterology;  Laterality: Left;  . LAPAROSCOPIC APPENDECTOMY N/A 12/13/2012   Procedure: APPENDECTOMY LAPAROSCOPIC;  Surgeon: Odis Hollingshead, MD;  Location: [facility];  Service: General;  Laterality: N/A;    reports that she has quit smoking. She has never used smokeless tobacco. She reports that she does not drink alcohol or use drugs. family history includes Cancer in her maternal aunt and another family member; Diabetes in her mother and other family members; Heart disease in her maternal aunt and another family member. Allergies  Allergen Reactions  . Codeine Other (See Comments)    Passed out  . Oxycodone Other (See Comments)    Visual Hallucinations  . Roxanol [Morphine] Hives and Rash  . Penicillins Rash    Has patient had a PCN reaction causing immediate rash, facial/tongue/throat swelling, SOB or lightheadedness with hypotension: Yes Has patient had a PCN reaction causing severe rash involving mucus membranes or skin necrosis: Yes Has patient had a PCN reaction that required hospitalization: No Has patient had a PCN reaction occurring within the last 10 years: No If all of the above answers are "NO", then may proceed with Cephalosporin use.    Current Outpatient Medications on File Prior to Visit  Medication Sig Dispense Refill  . amLODipine (NORVASC) 5 MG tablet TAKE 1 TABLET EVERY DAY 90 tablet 3  . aspirin 81 MG tablet Take 1 tablet (81 mg total) by mouth daily. 30 tablet   .  atorvastatin (LIPITOR) 20 MG tablet TAKE 1 TABLET EVERY DAY 90 tablet 3  . Calcium Carbonate-Vitamin D (CALCIUM-D) 600-400 MG-UNIT TABS Take by mouth 2 (two) times daily.      . diazepam (VALIUM) 5 MG tablet TAKE 1/2 TABLET BY MOUTH DAILY AS NEEDED FOR STRESS 30 tablet 5  . Ferrous Sulfate (IRON) 325 (65 FE) MG TABS Take 1 tablet by mouth  2 (two) times daily.     . furosemide (LASIX) 40 MG tablet TAKE 1 TABLET EVERY DAY 90 tablet 3  . glucose blood test strip Used to test blood sugar 3 times daily 100 each 12  . irbesartan (AVAPRO) 300 MG tablet TAKE 1 TABLET AT BEDTIME 90 tablet 3  . metoprolol succinate (TOPROL-XL) 25 MG 24 hr tablet TAKE 1/2 TABLET(12.5 MG) BY MOUTH DAILY 45 tablet 1  . Multiple Vitamins-Minerals (CENTRUM SILVER PO) Take 1 tablet by mouth daily.      No current facility-administered medications on file prior to visit.    Review of Systems Constitutional: Negative for other unusual diaphoresis, sweats, appetite or weight changes HENT: Negative for other worsening hearing loss, ear pain, facial swelling, mouth sores or neck stiffness.   Eyes: Negative for other worsening pain, redness or other visual disturbance.  Respiratory: Negative for other stridor or swelling Cardiovascular: Negative for other palpitations or other chest pain  Gastrointestinal: Negative for worsening diarrhea or loose stools, blood in stool, distention or other pain Genitourinary: Negative for hematuria, flank pain or other change in urine volume.  Musculoskeletal: Negative for myalgias or other joint swelling.  Skin: Negative for other color change, or other wound or worsening drainage.  Neurological: Negative for other syncope or numbness. Hematological: Negative for other adenopathy or swelling Psychiatric/Behavioral: Negative for hallucinations, other worsening agitation, SI, self-injury, or new decreased concentration All other system neg per pt    Objective:   Physical Exam BP (!) 142/82   Pulse 65   Temp 98.7 F (37.1 C) (Oral)   Ht 5\' 1"  (1.549 m)   Wt 153 lb (69.4 kg)   SpO2 96%   BMI 28.91 kg/m  VS noted,  Constitutional: Pt is oriented to person, place, and time. Appears well-developed and well-nourished, in no significant distress and comfortable Head: Normocephalic and atraumatic  Eyes: Conjunctivae and EOM  are normal. Pupils are equal, round, and reactive to light Right Ear: External ear normal without discharge Left Ear: External ear normal without discharge Nose: Nose without discharge or deformity Mouth/Throat: Oropharynx is without other ulcerations and moist  Neck: Normal range of motion. Neck supple. No JVD present. No tracheal deviation present or significant neck LA or mass Cardiovascular: Normal rate, regular rhythm, normal heart sounds and intact distal pulses.   Pulmonary/Chest: WOB normal and breath sounds without rales or wheezing  Abdominal: Soft. Bowel sounds are normal. NT. No HSM  Musculoskeletal: Normal range of motion. Exhibits no edema Lymphadenopathy: Has no other cervical adenopathy.  Neurological: Pt is alert and oriented to person, place, and time. Pt has normal reflexes. No cranial nerve deficit. Motor grossly intact, Gait intact Skin: Skin is warm and dry. No rash noted or new ulcerations Psychiatric:  Has normal mood and affect. Behavior is normal without agitation No other exam findings Lab Results  Component Value Date   WBC 8.7 02/20/2018   HGB 12.1 02/20/2018   HCT 36.6 02/20/2018   PLT 185.0 02/20/2018   GLUCOSE 116 (H) 02/20/2018   CHOL 237 (H) 02/20/2018   TRIG 102.0 02/20/2018  HDL 53.50 02/20/2018   LDLCALC 163 (H) 02/20/2018   ALT 15 02/20/2018   AST 22 02/20/2018   NA 143 02/20/2018   K 4.4 02/20/2018   CL 105 02/20/2018   CREATININE 1.05 02/20/2018   BUN 29 (H) 02/20/2018   CO2 30 02/20/2018   TSH 8.15 (H) 02/20/2018   INR 1.06 07/31/2016   HGBA1C 6.5 02/20/2018   MICROALBUR 84.1 (H) 02/20/2018          Assessment & Plan:

## 2018-02-24 NOTE — Assessment & Plan Note (Signed)
Chronic persistent, again declines urology referral

## 2018-02-24 NOTE — Assessment & Plan Note (Signed)
stable overall by history and exam, recent data reviewed with pt, and pt to continue medical treatment as before,  to f/u any worsening symptoms or concerns  

## 2018-02-24 NOTE — Assessment & Plan Note (Signed)
With incresaed LDL, advised diet and statin compliacne

## 2018-03-11 ENCOUNTER — Other Ambulatory Visit: Payer: Self-pay | Admitting: Internal Medicine

## 2018-03-11 NOTE — Telephone Encounter (Signed)
Done erx 

## 2018-08-26 ENCOUNTER — Ambulatory Visit: Payer: Medicare HMO | Admitting: Internal Medicine

## 2018-09-15 DIAGNOSIS — H53411 Scotoma involving central area, right eye: Secondary | ICD-10-CM | POA: Diagnosis not present

## 2018-09-15 DIAGNOSIS — H35453 Secondary pigmentary degeneration, bilateral: Secondary | ICD-10-CM | POA: Diagnosis not present

## 2018-09-15 DIAGNOSIS — H353114 Nonexudative age-related macular degeneration, right eye, advanced atrophic with subfoveal involvement: Secondary | ICD-10-CM | POA: Diagnosis not present

## 2018-09-15 DIAGNOSIS — H353122 Nonexudative age-related macular degeneration, left eye, intermediate dry stage: Secondary | ICD-10-CM | POA: Diagnosis not present

## 2018-09-15 DIAGNOSIS — Z961 Presence of intraocular lens: Secondary | ICD-10-CM | POA: Diagnosis not present

## 2018-09-15 DIAGNOSIS — H35363 Drusen (degenerative) of macula, bilateral: Secondary | ICD-10-CM | POA: Diagnosis not present

## 2018-10-20 ENCOUNTER — Telehealth: Payer: Self-pay | Admitting: Internal Medicine

## 2018-10-20 MED ORDER — DIAZEPAM 5 MG PO TABS: ORAL_TABLET | ORAL | 1 refills | Status: DC

## 2018-10-20 NOTE — Telephone Encounter (Signed)
Done erx 

## 2019-05-06 DIAGNOSIS — H353221 Exudative age-related macular degeneration, left eye, with active choroidal neovascularization: Secondary | ICD-10-CM | POA: Diagnosis not present

## 2019-05-18 DIAGNOSIS — H353114 Nonexudative age-related macular degeneration, right eye, advanced atrophic with subfoveal involvement: Secondary | ICD-10-CM | POA: Diagnosis not present

## 2019-05-18 DIAGNOSIS — H353221 Exudative age-related macular degeneration, left eye, with active choroidal neovascularization: Secondary | ICD-10-CM | POA: Diagnosis not present

## 2019-05-18 DIAGNOSIS — H35453 Secondary pigmentary degeneration, bilateral: Secondary | ICD-10-CM | POA: Diagnosis not present

## 2019-05-18 DIAGNOSIS — H53411 Scotoma involving central area, right eye: Secondary | ICD-10-CM | POA: Diagnosis not present

## 2019-05-18 DIAGNOSIS — Z961 Presence of intraocular lens: Secondary | ICD-10-CM | POA: Diagnosis not present

## 2019-05-18 DIAGNOSIS — H35363 Drusen (degenerative) of macula, bilateral: Secondary | ICD-10-CM | POA: Diagnosis not present

## 2019-05-20 ENCOUNTER — Other Ambulatory Visit: Payer: Self-pay | Admitting: Internal Medicine

## 2019-06-10 DIAGNOSIS — H353221 Exudative age-related macular degeneration, left eye, with active choroidal neovascularization: Secondary | ICD-10-CM | POA: Diagnosis not present

## 2019-07-15 DIAGNOSIS — H353221 Exudative age-related macular degeneration, left eye, with active choroidal neovascularization: Secondary | ICD-10-CM | POA: Diagnosis not present

## 2019-08-17 DIAGNOSIS — H353221 Exudative age-related macular degeneration, left eye, with active choroidal neovascularization: Secondary | ICD-10-CM | POA: Diagnosis not present

## 2019-10-05 DIAGNOSIS — H353221 Exudative age-related macular degeneration, left eye, with active choroidal neovascularization: Secondary | ICD-10-CM | POA: Diagnosis not present

## 2019-10-21 ENCOUNTER — Other Ambulatory Visit (INDEPENDENT_AMBULATORY_CARE_PROVIDER_SITE_OTHER): Payer: Medicare HMO

## 2019-10-21 ENCOUNTER — Other Ambulatory Visit: Payer: Self-pay | Admitting: Internal Medicine

## 2019-10-21 DIAGNOSIS — E559 Vitamin D deficiency, unspecified: Secondary | ICD-10-CM

## 2019-10-21 DIAGNOSIS — E119 Type 2 diabetes mellitus without complications: Secondary | ICD-10-CM

## 2019-10-21 DIAGNOSIS — E538 Deficiency of other specified B group vitamins: Secondary | ICD-10-CM | POA: Diagnosis not present

## 2019-10-21 LAB — URINALYSIS, ROUTINE W REFLEX MICROSCOPIC
Bilirubin Urine: NEGATIVE
Ketones, ur: NEGATIVE
Leukocytes,Ua: NEGATIVE
Nitrite: NEGATIVE
Specific Gravity, Urine: 1.025 (ref 1.000–1.030)
Total Protein, Urine: >=300 — AB
Urine Glucose: NEGATIVE
Urobilinogen, UA: 0.2 (ref 0.0–1.0)
pH: 6 (ref 5.0–8.0)

## 2019-10-21 LAB — CBC WITH DIFFERENTIAL/PLATELET
Basophils Absolute: 0.1 10*3/uL (ref 0.0–0.1)
Basophils Relative: 0.6 % (ref 0.0–3.0)
Eosinophils Absolute: 0.2 10*3/uL (ref 0.0–0.7)
Eosinophils Relative: 1.8 % (ref 0.0–5.0)
HCT: 37.7 % (ref 36.0–46.0)
Hemoglobin: 12.3 g/dL (ref 12.0–15.0)
Lymphocytes Relative: 36.5 % (ref 12.0–46.0)
Lymphs Abs: 4.9 10*3/uL — ABNORMAL HIGH (ref 0.7–4.0)
MCHC: 32.6 g/dL (ref 30.0–36.0)
MCV: 92.5 fl (ref 78.0–100.0)
Monocytes Absolute: 0.9 10*3/uL (ref 0.1–1.0)
Monocytes Relative: 7 % (ref 3.0–12.0)
Neutro Abs: 7.2 10*3/uL (ref 1.4–7.7)
Neutrophils Relative %: 54.1 % (ref 43.0–77.0)
Platelets: 249 10*3/uL (ref 150.0–400.0)
RBC: 4.08 Mil/uL (ref 3.87–5.11)
RDW: 14.4 % (ref 11.5–15.5)
WBC: 13.4 10*3/uL — ABNORMAL HIGH (ref 4.0–10.5)

## 2019-10-21 LAB — LIPID PANEL
Cholesterol: 294 mg/dL — ABNORMAL HIGH (ref 0–200)
HDL: 62.1 mg/dL (ref >39.00)
LDL Cholesterol: 208 mg/dL — ABNORMAL HIGH (ref 0–99)
NonHDL: 232.38
Total CHOL/HDL Ratio: 5
Triglycerides: 122 mg/dL (ref 0.0–149.0)
VLDL: 24.4 mg/dL (ref 0.0–40.0)

## 2019-10-21 LAB — HEPATIC FUNCTION PANEL
ALT: 12 U/L (ref 0–35)
AST: 19 U/L (ref 0–37)
Albumin: 3.2 g/dL — ABNORMAL LOW (ref 3.5–5.2)
Alkaline Phosphatase: 64 U/L (ref 39–117)
Bilirubin, Direct: 0.1 mg/dL (ref 0.0–0.3)
Total Bilirubin: 0.5 mg/dL (ref 0.2–1.2)
Total Protein: 6.1 g/dL (ref 6.0–8.3)

## 2019-10-21 LAB — HEMOGLOBIN A1C: Hgb A1c MFr Bld: 6.2 % (ref 4.6–6.5)

## 2019-10-21 LAB — MICROALBUMIN / CREATININE URINE RATIO
Creatinine,U: 113.6 mg/dL
Microalb Creat Ratio: 362.1 mg/g — ABNORMAL HIGH (ref 0.0–30.0)
Microalb, Ur: 411.3 mg/dL — ABNORMAL HIGH (ref 0.0–1.9)

## 2019-10-21 LAB — VITAMIN B12: Vitamin B-12: 329 pg/mL (ref 211–911)

## 2019-10-21 LAB — TSH: TSH: 8.9 u[IU]/mL — ABNORMAL HIGH (ref 0.35–4.50)

## 2019-10-21 LAB — VITAMIN D 25 HYDROXY (VIT D DEFICIENCY, FRACTURES): VITD: 18.77 ng/mL — ABNORMAL LOW (ref 30.00–100.00)

## 2019-10-21 NOTE — Addendum Note (Signed)
Addended by: Trenda Moots on: 9/90/9400 11:23 AM   Modules accepted: Orders

## 2019-10-26 ENCOUNTER — Ambulatory Visit (INDEPENDENT_AMBULATORY_CARE_PROVIDER_SITE_OTHER): Payer: Medicare HMO | Admitting: Internal Medicine

## 2019-10-26 ENCOUNTER — Other Ambulatory Visit: Payer: Self-pay

## 2019-10-26 VITALS — BP 160/90 | HR 70 | Temp 98.3°F | Ht 61.0 in | Wt 134.0 lb

## 2019-10-26 DIAGNOSIS — E785 Hyperlipidemia, unspecified: Secondary | ICD-10-CM | POA: Diagnosis not present

## 2019-10-26 DIAGNOSIS — E038 Other specified hypothyroidism: Secondary | ICD-10-CM

## 2019-10-26 DIAGNOSIS — E559 Vitamin D deficiency, unspecified: Secondary | ICD-10-CM | POA: Diagnosis not present

## 2019-10-26 DIAGNOSIS — E119 Type 2 diabetes mellitus without complications: Secondary | ICD-10-CM

## 2019-10-26 DIAGNOSIS — Z23 Encounter for immunization: Secondary | ICD-10-CM | POA: Diagnosis not present

## 2019-10-26 DIAGNOSIS — Z0000 Encounter for general adult medical examination without abnormal findings: Secondary | ICD-10-CM

## 2019-10-26 DIAGNOSIS — R3121 Asymptomatic microscopic hematuria: Secondary | ICD-10-CM | POA: Diagnosis not present

## 2019-10-26 MED ORDER — IRBESARTAN 300 MG PO TABS
300.0000 mg | ORAL_TABLET | Freq: Every day | ORAL | 3 refills | Status: DC
Start: 2019-10-26 — End: 2019-12-16

## 2019-10-26 MED ORDER — LEVOTHYROXINE SODIUM 25 MCG PO TABS
25.0000 ug | ORAL_TABLET | Freq: Every day | ORAL | 3 refills | Status: DC
Start: 2019-10-26 — End: 2020-04-25

## 2019-10-26 MED ORDER — ATORVASTATIN CALCIUM 20 MG PO TABS
20.0000 mg | ORAL_TABLET | Freq: Every day | ORAL | 3 refills | Status: DC
Start: 2019-10-26 — End: 2019-12-16

## 2019-10-26 MED ORDER — VITAMIN D (ERGOCALCIFEROL) 1.25 MG (50000 UNIT) PO CAPS: 50000.0000 [IU] | ORAL_CAPSULE | ORAL | 0 refills | Status: DC

## 2019-10-26 MED ORDER — AMLODIPINE BESYLATE 5 MG PO TABS
5.0000 mg | ORAL_TABLET | Freq: Every day | ORAL | 3 refills | Status: DC
Start: 2019-10-26 — End: 2019-12-16

## 2019-10-26 MED ORDER — FUROSEMIDE 40 MG PO TABS
40.0000 mg | ORAL_TABLET | Freq: Every day | ORAL | 3 refills | Status: DC
Start: 2019-10-26 — End: 2019-12-16

## 2019-10-26 MED ORDER — DIAZEPAM 5 MG PO TABS
ORAL_TABLET | ORAL | 1 refills | Status: DC
Start: 2019-10-26 — End: 2020-05-27

## 2019-10-26 MED ORDER — METOPROLOL SUCCINATE ER 25 MG PO TB24
ORAL_TABLET | ORAL | 3 refills | Status: DC
Start: 2019-10-26 — End: 2020-04-25

## 2019-10-26 NOTE — Patient Instructions (Addendum)
You had the flu shot today  Please take Vitamin D 50000 units weekly for 12 weeks, then plan to change to OTC Vitamin D3 at 2000 units per day, indefinitely.  Please continue all other medications as before, and refills have been done if requested.  Please have the pharmacy call with any other refills you may need.  Please continue your efforts at being more active, low cholesterol diet, and weight control.  You are otherwise up to date with prevention measures today.  Please keep your appointments with your specialists as you may have planned  Please make an Appointment to return in 6 months, or sooner if needed, also with Lab done at the [facility] lab a few days prior

## 2019-10-26 NOTE — Progress Notes (Signed)
Subjective:    Patient ID: Norma Smith, female    DOB: 11-19-1933, 86 y.o.   MRN: [id number]  HPI  Here for wellness and f/u;  Overall doing ok;  Pt denies Chest pain, worsening SOB, DOE, wheezing, orthopnea, PND, worsening LE edema, palpitations, dizziness or syncope.  Pt denies neurological change such as new headache, facial or extremity weakness.  Pt denies polydipsia, polyuria, or low sugar symptoms. Pt states overall good compliance with treatment and medications, good tolerability, and has been trying to follow appropriate diet.  Pt denies worsening depressive symptoms, suicidal ideation or panic. No fever, night sweats, wt loss, loss of appetite, or other constitutional symptoms.  Pt states good ability with ADL's, has low fall risk, home safety reviewed and adequate, no other significant changes in hearing or vision, and only occasionally active with exercise.  Daughter started new job and cant drive her as much now and vision is impaired but can drive on streets she knows, just cant read street name signs. Denies hyper or hypo thyroid symptoms such as voice, skin or hair change.  Denies urinary symptoms such as dysuria, frequency, urgency, flank pain, hematuria or n/v, fever, chills. Past Medical History:  Diagnosis Date  . Allergy   . Anemia   . Aortic stenosis   . Aortic stenosis, moderate 06/16/2011  . Blood transfusion without reported diagnosis 2010?  Marland Kitchen Cardiomyopathy ([place])   . CLL (chronic lymphoblastic leukemia) 12/01/2010  . Diabetes mellitus   . Dry skin dermatitis 12/01/2010  . Hyperlipidemia   . Hypertension   . Iron deficiency 12/01/2010  . Monoclonal B-cell lymphocytosis of unknown significance 12/02/2014  . Osteopenia   . Skin cancer   . Stroke [facility])    1999   Past Surgical History:  Procedure Laterality Date  . APPENDECTOMY    . BREAST BIOPSY  1976  . CATARACT EXTRACTION    . COLONOSCOPY WITH PROPOFOL Left 08/03/2016   Procedure: COLONOSCOPY WITH PROPOFOL;   Surgeon: Otis Brace, MD;  Location: [facility];  Service: Gastroenterology;  Laterality: Left;  . ESOPHAGOGASTRODUODENOSCOPY (EGD) WITH PROPOFOL Left 08/03/2016   Procedure: ESOPHAGOGASTRODUODENOSCOPY (EGD) WITH PROPOFOL;  Surgeon: Otis Brace, MD;  [facility];  Service: Gastroenterology;  Laterality: Left;  . LAPAROSCOPIC APPENDECTOMY N/A 12/13/2012   Procedure: APPENDECTOMY LAPAROSCOPIC;  Surgeon: Odis Hollingshead, MD;  Location: [facility];  Service: General;  Laterality: N/A;    reports that she has quit smoking. She has never used smokeless tobacco. She reports that she does not drink alcohol and does not use drugs. family history includes Cancer in her maternal aunt and another family member; Diabetes in her mother and other family members; Heart disease in her maternal aunt and another family member. Allergies  Allergen Reactions  . Codeine Other (See Comments)    Passed out  . Oxycodone Other (See Comments)    Visual Hallucinations  . Roxanol [Morphine] Hives and Rash  . Penicillins Rash    Has patient had a PCN reaction causing immediate rash, facial/tongue/throat swelling, SOB or lightheadedness with hypotension: Yes Has patient had a PCN reaction causing severe rash involving mucus membranes or skin necrosis: Yes Has patient had a PCN reaction that required hospitalization: No Has patient had a PCN reaction occurring within the last 10 years: No If all of the above answers are "NO", then may proceed with Cephalosporin use.    Current Outpatient Medications on File Prior to Visit  Medication Sig Dispense Refill  . aspirin 81 MG tablet  Take 1 tablet (81 mg total) by mouth daily. 30 tablet   . Calcium Carbonate-Vitamin D (CALCIUM-D) 600-400 MG-UNIT TABS Take by mouth 2 (two) times daily.      . Ferrous Sulfate (IRON) 325 (65 FE) MG TABS Take 1 tablet by mouth 2 (two) times daily.     Marland Kitchen glucose blood test strip Used to test blood sugar 3 times daily 100 each  12  . Multiple Vitamins-Minerals (CENTRUM SILVER PO) Take 1 tablet by mouth daily.      No current facility-administered medications on file prior to visit.   Review of Systems All otherwise neg per pt    Objective:   Physical Exam BP (!) 160/90 (BP Location: Left Arm, Patient Position: Sitting, Cuff Size: Large)   Pulse 70   Temp 98.3 F (36.8 C) (Oral)   Ht 5\' 1"  (1.549 m)   Wt 134 lb (60.8 kg)   SpO2 95%   BMI 25.32 kg/m  VS noted,  Constitutional: Pt appears in NAD HENT: Head: NCAT.  Right Ear: External ear normal.  Left Ear: External ear normal.  Eyes: . Pupils are equal, round, and reactive to light. Conjunctivae and EOM are normal Nose: without d/c or deformity Neck: Neck supple. Gross normal ROM Cardiovascular: Normal rate and regular rhythm.   Pulmonary/Chest: Effort normal and breath sounds without rales or wheezing.  Abd:  Soft, NT, ND, + BS, no organomegaly Neurological: Pt is alert. At baseline orientation, motor grossly intact Skin: Skin is warm. No rashes, other new lesions, no LE edema Psychiatric: Pt behavior is normal without agitation  All otherwise neg per pt Lab Results  Component Value Date   WBC 13.4 (H) 10/21/2019   HGB 12.3 10/21/2019   HCT 37.7 10/21/2019   PLT 249.0 10/21/2019   GLUCOSE 116 (H) 02/20/2018   CHOL 294 (H) 10/21/2019   TRIG 122.0 10/21/2019   HDL 62.10 10/21/2019   LDLCALC 208 (H) 10/21/2019   ALT 12 10/21/2019   AST 19 10/21/2019   NA 143 02/20/2018   K 4.4 02/20/2018   CL 105 02/20/2018   CREATININE 1.05 02/20/2018   BUN 29 (H) 02/20/2018   CO2 30 02/20/2018   TSH 8.90 (H) 10/21/2019   INR 1.06 07/31/2016   HGBA1C 6.2 10/21/2019   MICROALBUR 411.3 (H) 10/21/2019      Assessment & Plan:

## 2019-11-01 ENCOUNTER — Encounter: Payer: Self-pay | Admitting: Internal Medicine

## 2019-11-01 NOTE — Assessment & Plan Note (Signed)

## 2019-11-01 NOTE — Assessment & Plan Note (Signed)
stable overall by history and exam, recent data reviewed with pt, and pt to continue medical treatment as before,  to f/u any worsening symptoms or concerns  

## 2019-11-01 NOTE — Assessment & Plan Note (Signed)
Encouraged med compliance 

## 2019-11-01 NOTE — Assessment & Plan Note (Signed)
[place], again declines urology referral

## 2019-11-01 NOTE — Assessment & Plan Note (Signed)
For oral replacement 

## 2019-11-06 DIAGNOSIS — H353221 Exudative age-related macular degeneration, left eye, with active choroidal neovascularization: Secondary | ICD-10-CM | POA: Diagnosis not present

## 2019-12-11 DIAGNOSIS — H353221 Exudative age-related macular degeneration, left eye, with active choroidal neovascularization: Secondary | ICD-10-CM | POA: Diagnosis not present

## 2019-12-14 ENCOUNTER — Telehealth: Payer: Self-pay | Admitting: Internal Medicine

## 2019-12-14 NOTE — Telephone Encounter (Signed)
   1.Medication Requested: amLODipine (NORVASC) 5 MG tablet atorvastatin (LIPITOR) 20 MG tablet furosemide (LASIX) 40 MG tablet irbesartan (AVAPRO) 300 MG tablet 2. Pharmacy (Name, Street, [place]): [facility]  3. On Med List:yes  4. Last Visit with PCP: 10/26/19  5. Next visit date with PCP: 04/25/20   Agent: Please be advised that RX refills may take up to 3 business days. We ask that you follow-up with your pharmacy.

## 2019-12-16 ENCOUNTER — Other Ambulatory Visit: Payer: Self-pay

## 2019-12-16 MED ORDER — ATORVASTATIN CALCIUM 20 MG PO TABS
20.0000 mg | ORAL_TABLET | Freq: Every day | ORAL | 3 refills | Status: DC
Start: 2019-12-16 — End: 2020-05-27

## 2019-12-16 MED ORDER — IRBESARTAN 300 MG PO TABS
300.0000 mg | ORAL_TABLET | Freq: Every day | ORAL | 3 refills | Status: DC
Start: 2019-12-16 — End: 2020-05-20

## 2019-12-16 MED ORDER — FUROSEMIDE 40 MG PO TABS
40.0000 mg | ORAL_TABLET | Freq: Every day | ORAL | 3 refills | Status: DC
Start: 2019-12-16 — End: 2020-04-25

## 2019-12-16 MED ORDER — AMLODIPINE BESYLATE 5 MG PO TABS
5.0000 mg | ORAL_TABLET | Freq: Every day | ORAL | 3 refills | Status: DC
Start: 2019-12-16 — End: 2020-04-26

## 2020-01-19 ENCOUNTER — Other Ambulatory Visit: Payer: Self-pay | Admitting: Internal Medicine

## 2020-01-19 NOTE — Telephone Encounter (Signed)
Please change to OTC Vitamin D3 at 2000 units per day, indefinitely.  

## 2020-01-27 DIAGNOSIS — H353221 Exudative age-related macular degeneration, left eye, with active choroidal neovascularization: Secondary | ICD-10-CM | POA: Diagnosis not present

## 2020-03-02 DIAGNOSIS — H353221 Exudative age-related macular degeneration, left eye, with active choroidal neovascularization: Secondary | ICD-10-CM | POA: Diagnosis not present

## 2020-04-06 DIAGNOSIS — H353221 Exudative age-related macular degeneration, left eye, with active choroidal neovascularization: Secondary | ICD-10-CM | POA: Diagnosis not present

## 2020-04-22 ENCOUNTER — Other Ambulatory Visit (INDEPENDENT_AMBULATORY_CARE_PROVIDER_SITE_OTHER): Payer: Medicare HMO

## 2020-04-22 DIAGNOSIS — E559 Vitamin D deficiency, unspecified: Secondary | ICD-10-CM | POA: Diagnosis not present

## 2020-04-22 DIAGNOSIS — E119 Type 2 diabetes mellitus without complications: Secondary | ICD-10-CM | POA: Diagnosis not present

## 2020-04-22 DIAGNOSIS — E038 Other specified hypothyroidism: Secondary | ICD-10-CM

## 2020-04-22 LAB — TSH: TSH: 11.5 u[IU]/mL — ABNORMAL HIGH (ref 0.35–4.50)

## 2020-04-22 LAB — LIPID PANEL
Cholesterol: 338 mg/dL — ABNORMAL HIGH (ref 0–200)
HDL: 70.4 mg/dL (ref >39.00)
LDL Cholesterol: 240 mg/dL — ABNORMAL HIGH (ref 0–99)
NonHDL: 267.18
Total CHOL/HDL Ratio: 5
Triglycerides: 135 mg/dL (ref 0.0–149.0)
VLDL: 27 mg/dL (ref 0.0–40.0)

## 2020-04-22 LAB — COMPREHENSIVE METABOLIC PANEL
ALT: 14 U/L (ref 0–35)
AST: 22 U/L (ref 0–37)
Albumin: 2.8 g/dL — ABNORMAL LOW (ref 3.5–5.2)
Alkaline Phosphatase: 64 U/L (ref 39–117)
BUN: 29 mg/dL — ABNORMAL HIGH (ref 6–23)
CO2: 26 mEq/L (ref 19–32)
Calcium: 8.2 mg/dL — ABNORMAL LOW (ref 8.4–10.5)
Chloride: 106 mEq/L (ref 96–112)
Creatinine, Ser: 2.11 mg/dL — ABNORMAL HIGH (ref 0.40–1.20)
GFR: 21.11 mL/min — ABNORMAL LOW (ref >60.00)
Glucose, Bld: 136 mg/dL — ABNORMAL HIGH (ref 70–99)
Potassium: 3.3 mEq/L — ABNORMAL LOW (ref 3.5–5.1)
Sodium: 143 mEq/L (ref 135–145)
Total Bilirubin: 0.4 mg/dL (ref 0.2–1.2)
Total Protein: 5.9 g/dL — ABNORMAL LOW (ref 6.0–8.3)

## 2020-04-22 LAB — CBC WITH DIFFERENTIAL/PLATELET
Basophils Absolute: 0.2 10*3/uL — ABNORMAL HIGH (ref 0.0–0.1)
Basophils Relative: 1.1 % (ref 0.0–3.0)
Eosinophils Absolute: 0.2 10*3/uL (ref 0.0–0.7)
Eosinophils Relative: 1.7 % (ref 0.0–5.0)
HCT: 38.2 % (ref 36.0–46.0)
Hemoglobin: 12.4 g/dL (ref 12.0–15.0)
Lymphocytes Relative: 38.2 % (ref 12.0–46.0)
Lymphs Abs: 5.3 10*3/uL — ABNORMAL HIGH (ref 0.7–4.0)
MCHC: 32.5 g/dL (ref 30.0–36.0)
MCV: 92.2 fl (ref 78.0–100.0)
Monocytes Absolute: 0.8 10*3/uL (ref 0.1–1.0)
Monocytes Relative: 5.9 % (ref 3.0–12.0)
Neutro Abs: 7.3 10*3/uL (ref 1.4–7.7)
Neutrophils Relative %: 53.1 % (ref 43.0–77.0)
Platelets: 299 10*3/uL (ref 150.0–400.0)
RBC: 4.15 Mil/uL (ref 3.87–5.11)
RDW: 14.3 % (ref 11.5–15.5)
WBC: 13.8 10*3/uL — ABNORMAL HIGH (ref 4.0–10.5)

## 2020-04-22 LAB — HEMOGLOBIN A1C: Hgb A1c MFr Bld: 6.3 % (ref 4.6–6.5)

## 2020-04-22 LAB — VITAMIN D 25 HYDROXY (VIT D DEFICIENCY, FRACTURES): VITD: 20.69 ng/mL — ABNORMAL LOW (ref 30.00–100.00)

## 2020-04-22 LAB — T4, FREE: Free T4: 1.03 ng/dL (ref 0.60–1.60)

## 2020-04-25 ENCOUNTER — Encounter: Payer: Self-pay | Admitting: Internal Medicine

## 2020-04-25 ENCOUNTER — Ambulatory Visit (INDEPENDENT_AMBULATORY_CARE_PROVIDER_SITE_OTHER): Payer: Medicare HMO | Admitting: Internal Medicine

## 2020-04-25 ENCOUNTER — Other Ambulatory Visit: Payer: Self-pay

## 2020-04-25 VITALS — BP 140/90 | HR 100 | Temp 98.8°F | Ht 61.0 in | Wt 151.4 lb

## 2020-04-25 DIAGNOSIS — F32A Depression, unspecified: Secondary | ICD-10-CM

## 2020-04-25 DIAGNOSIS — I10 Essential (primary) hypertension: Secondary | ICD-10-CM

## 2020-04-25 DIAGNOSIS — E559 Vitamin D deficiency, unspecified: Secondary | ICD-10-CM | POA: Diagnosis not present

## 2020-04-25 DIAGNOSIS — E039 Hypothyroidism, unspecified: Secondary | ICD-10-CM

## 2020-04-25 DIAGNOSIS — Z0001 Encounter for general adult medical examination with abnormal findings: Secondary | ICD-10-CM | POA: Diagnosis not present

## 2020-04-25 DIAGNOSIS — R609 Edema, unspecified: Secondary | ICD-10-CM | POA: Diagnosis not present

## 2020-04-25 DIAGNOSIS — N184 Chronic kidney disease, stage 4 (severe): Secondary | ICD-10-CM | POA: Diagnosis not present

## 2020-04-25 DIAGNOSIS — Z7189 Other specified counseling: Secondary | ICD-10-CM | POA: Diagnosis not present

## 2020-04-25 DIAGNOSIS — H3530 Unspecified macular degeneration: Secondary | ICD-10-CM | POA: Diagnosis not present

## 2020-04-25 DIAGNOSIS — N63 Unspecified lump in unspecified breast: Secondary | ICD-10-CM | POA: Insufficient documentation

## 2020-04-25 DIAGNOSIS — E7800 Pure hypercholesterolemia, unspecified: Secondary | ICD-10-CM | POA: Diagnosis not present

## 2020-04-25 DIAGNOSIS — L989 Disorder of the skin and subcutaneous tissue, unspecified: Secondary | ICD-10-CM

## 2020-04-25 DIAGNOSIS — E1165 Type 2 diabetes mellitus with hyperglycemia: Secondary | ICD-10-CM

## 2020-04-25 MED ORDER — POTASSIUM CHLORIDE ER 10 MEQ PO TBCR: 10.0000 meq | EXTENDED_RELEASE_TABLET | Freq: Every day | ORAL | 3 refills | Status: DC

## 2020-04-25 MED ORDER — METOPROLOL SUCCINATE ER 25 MG PO TB24: ORAL_TABLET | ORAL | 3 refills | Status: AC

## 2020-04-25 MED ORDER — LEVOTHYROXINE SODIUM 50 MCG PO TABS: 50.0000 ug | ORAL_TABLET | Freq: Every day | ORAL | 3 refills | Status: AC

## 2020-04-25 MED ORDER — FUROSEMIDE 40 MG PO TABS: 40.0000 mg | ORAL_TABLET | Freq: Two times a day (BID) | ORAL | 3 refills | Status: DC

## 2020-04-25 NOTE — Progress Notes (Signed)
Patient ID: Norma Smith, female   DOB: 1933-01-31, 87 y.o.   MRN: [id number]         Chief Complaint:: wellness exam and Follow-up (Bilateral feet and leg swelling that started over the last couple of months. She also mentioned that she is extreme fatiugued. She finds herself napping throughout the day.)  and left forehead lesion, left breast enlarged, depression, macular degeneration, low vit d, low thyroid, htn, hld, low K,m and ckd       HPI:  Norma Smith is a 87 y.o. female here for wellness exam; declines Tdap, o/w up to date with preventive referrals, and immunizations                        Also admits to mild to mod worsening depressive symptoms, but no suicidal ideation, or panic; has ongoing anxiety.  Mentions to me she is not interested in aggressive efforts in her care as she just does not feel she has much time to live.  Has macular degeneration worsening, and no longer can drive.  Has a new left forehead lesion raised non healing x 2 mo but declines derm referral.  Has several months worsening left breast enlargement, fairly certain she has breast cancer but purposefully declines oncology referral or mammogram.  Has been taking about 4000 u vit d per day.  Denies hyper or hypo thyroid symptoms such as voice, skin or hair change.  Pt denies chest pain, increased sob or doe, wheezing, orthopnea, PND, palpitations, dizziness or syncopee, but does have bilateral leg swelling that bothers her, wondering if she can have increased lasix. Recent labs indicate mild low K, and CKD.   Trying to follow lower chol diet, declines statin for now.   Denies new worsening neuro focal s/s.   Pt denies fever, wt loss, night sweats, loss of appetite, or other constitutional symptoms   Pt denies polydipsia, polyuria,   Wt Readings from Last 3 Encounters:  04/25/20 151 lb 6.4 oz (68.7 kg)  10/26/19 134 lb (60.8 kg)  02/24/18 153 lb (69.4 kg)   BP Readings from Last 3 Encounters:  04/25/20 140/90  10/26/19  (!) 160/90  02/24/18 (!) 142/82   Immunization History  Administered Date(s) Administered  . Fluad Quad(high Dose 65+) 10/09/2018, 10/26/2019  . Influenza Whole 10/22/2011, 10/02/2012  . Influenza, High Dose Seasonal PF 11/09/2016  . Influenza-Unspecified 10/15/2014, 11/09/2016, 10/22/2017  . Moderna Sars-Covid-2 Vaccination 02/23/2019, 03/23/2019, 12/01/2019  . Pneumococcal Conjugate-13 11/22/2004, 11/30/2013  . Pneumococcal Polysaccharide-23 08/25/2001  There are no preventive care reminders to display for this patient.    Past Medical History:  Diagnosis Date  . Allergy   . Anemia   . Aortic stenosis   . Aortic stenosis, moderate 06/16/2011  . Blood transfusion without reported diagnosis 2010?  Marland Kitchen Cardiomyopathy ([place])   . CLL (chronic lymphoblastic leukemia) 12/01/2010  . Diabetes mellitus   . Dry skin dermatitis 12/01/2010  . Hyperlipidemia   . Hypertension   . Iron deficiency 12/01/2010  . Monoclonal B-cell lymphocytosis of unknown significance 12/02/2014  . Osteopenia   . Skin cancer   . Stroke [facility])    1999   Past Surgical History:  Procedure Laterality Date  . APPENDECTOMY    . BREAST BIOPSY  1976  . CATARACT EXTRACTION    . COLONOSCOPY WITH PROPOFOL Left 08/03/2016   Procedure: COLONOSCOPY WITH PROPOFOL;  Surgeon: Otis Brace, MD;  Location: [place];  Service: Gastroenterology;  Laterality: Left;  .  ESOPHAGOGASTRODUODENOSCOPY (EGD) WITH PROPOFOL Left 08/03/2016   Procedure: ESOPHAGOGASTRODUODENOSCOPY (EGD) WITH PROPOFOL;  Surgeon: Otis Brace, MD;  Location: [place];  Service: Gastroenterology;  Laterality: Left;  . LAPAROSCOPIC APPENDECTOMY N/A 12/13/2012   Procedure: APPENDECTOMY LAPAROSCOPIC;  Surgeon: Odis Hollingshead, MD;  Location: [facility];  Service: General;  Laterality: N/A;    reports that she has quit smoking. She has never used smokeless tobacco. She reports that she does not drink alcohol and does not use drugs. family history  includes Cancer in her maternal aunt and another family member; Diabetes in her mother and other family members; Heart disease in her maternal aunt and another family member. Allergies  Allergen Reactions  . Codeine Other (See Comments)    Passed out  . Oxycodone Other (See Comments)    Visual Hallucinations  . Roxanol [Morphine] Hives and Rash  . Penicillins Rash    Has patient had a PCN reaction causing immediate rash, facial/tongue/throat swelling, SOB or lightheadedness with hypotension: Yes Has patient had a PCN reaction causing severe rash involving mucus membranes or skin necrosis: Yes Has patient had a PCN reaction that required hospitalization: No Has patient had a PCN reaction occurring within the last 10 years: No If all of the above answers are "NO", then may proceed with Cephalosporin use.    Current Outpatient Medications on File Prior to Visit  Medication Sig Dispense Refill  . aspirin 81 MG tablet Take 1 tablet (81 mg total) by mouth daily. 30 tablet   . atorvastatin (LIPITOR) 20 MG tablet Take 1 tablet (20 mg total) by mouth daily. 90 tablet 3  . Calcium Carbonate-Vitamin D (CALCIUM-D) 600-400 MG-UNIT TABS Take by mouth 2 (two) times daily.    . diazepam (VALIUM) 5 MG tablet 1/2 tab by mouth daily as needed 45 tablet 1  . Ferrous Sulfate (IRON) 325 (65 FE) MG TABS Take 1 tablet by mouth 2 (two) times daily.    Marland Kitchen glucose blood test strip Used to test blood sugar 3 times daily 100 each 12  . irbesartan (AVAPRO) 300 MG tablet Take 1 tablet (300 mg total) by mouth at bedtime. 90 tablet 3  . Multiple Vitamins-Minerals (CENTRUM SILVER PO) Take 1 tablet by mouth daily.    Marland Kitchen VITAMIN D PO Take 1 tablet by mouth daily.     No current facility-administered medications on file prior to visit.        ROS:  All others reviewed and negative.  Objective        PE:  BP 140/90   Pulse 100   Temp 98.8 F (37.1 C) (Oral)   Ht 5\' 1"  (1.549 m)   Wt 151 lb 6.4 oz (68.7 kg)   SpO2  94%   BMI 28.61 kg/m                 Constitutional: Pt appears in NAD               HENT: Head: NCAT.                Right Ear: External ear normal.                 Left Ear: External ear normal.                Eyes: . Pupils are equal, round, and reactive to light. Conjunctivae and EOM are normal               Nose: without d/c  or deformity               Neck: Neck supple. Gross normal ROM               Cardiovascular: Normal rate and regular rhythm.                 Pulmonary/Chest: Effort normal and breath sounds without rales or wheezing.                Breast exam - not formally done but left breast overtly larger than right new for her and apparently nontender               Abd:  Soft, NT, ND, + BS, no organomegaly               Neurological: Pt is alert. At baseline orientation, motor grossly intact               Skin: Skin is warm. No rashes, LE edema - 1-2+ bilat, Raised fleshy < 1/2 non healing, non tender lesion likely skin ca               Psychiatric: Pt behavior is normal without agitation   Micro: none  Cardiac tracings I have personally interpreted today:  none  Pertinent Radiological findings (summarize): none   Lab Results  Component Value Date   WBC 13.8 (H) 04/22/2020   HGB 12.4 04/22/2020   HCT 38.2 04/22/2020   PLT 299.0 04/22/2020   GLUCOSE 136 (H) 04/22/2020   CHOL 338 (H) 04/22/2020   TRIG 135.0 04/22/2020   HDL 70.40 04/22/2020   LDLCALC 240 (H) 04/22/2020   ALT 14 04/22/2020   AST 22 04/22/2020   NA 143 04/22/2020   K 3.3 (L) 04/22/2020   CL 106 04/22/2020   CREATININE 2.11 (H) 04/22/2020   BUN 29 (H) 04/22/2020   CO2 26 04/22/2020   TSH 11.50 (H) 04/22/2020   INR 1.06 07/31/2016   HGBA1C 6.3 04/22/2020   MICROALBUR 411.3 (H) 10/21/2019   Assessment/Plan:  Jeanne Diefendorf is a 87 y.o. White or Caucasian [1] female with  has a past medical history of Allergy, Anemia, Aortic stenosis, Aortic stenosis, moderate (06/16/2011), Blood transfusion  without reported diagnosis (2010?), Cardiomyopathy ([place]), CLL (chronic lymphoblastic leukemia) (12/01/2010), Diabetes mellitus, Dry skin dermatitis (12/01/2010), Hyperlipidemia, Hypertension, Iron deficiency (12/01/2010), Monoclonal B-cell lymphocytosis of unknown significance (12/02/2014), Osteopenia, Skin cancer, and Stroke ([place]).  Macular degeneration of both eyes With wet on the left with monthly injections., no longer able to drive  Encounter for well adult exam with abnormal findings Age and sex appropriate education and counseling updated with regular exercise and diet Referrals for preventative services - none needed Immunizations addressed - declines tdap Smoking counseling  - none needed Evidence for depression or other mood disorder - + worsening depressive symptoms - declines tx Most recent labs reviewed. I have personally reviewed and have noted: 1) the patient's medical and social history 2) The patient's current medications and supplements 3) The patient's height, weight, and BMI have been recorded in the chart   Vitamin D deficiency Last vitamin D Lab Results  Component Value Date   VD25OH 20.69 (L) 04/22/2020   Low on 4000 u vit d - to increase the 6000 u qd of VIt D3 oral replacement   Skin lesion Raised fleshy < 1/2 non healing, non tender lesion likely skin ca- declines dermatology referral  Peripheral edema For BNP but unclear she will return to  lab for this, for increased lasix 40 bid, kdur 10 qd, rov 2 wks  Hypertension BP Readings from Last 3 Encounters:  04/25/20 140/90  10/26/19 (!) 160/90  02/24/18 (!) 142/82   Still mild elevated, pt to continue medical treatment avapro, norvasc but increased toprol xl 25   Hyperlipidemia Lab Results  Component Value Date   LDLCALC 240 (H) 04/22/2020   Stable, pt delcines to restart statin lipitor  DNR (do not resuscitate) discussion D/w pt, asks for out of hosp DNR form signed to keep in her kitchen to avoid  any type of heroic measures per ems if called such as chest compressions, defibrillation, epi and intubation  Diabetes ([place]) Lab Results  Component Value Date   HGBA1C 6.3 04/22/2020   Stable, pt to continue current medical treatment  - diet   Depression A large issue today, but clearly does not want medication or referral today, continue to follow  CKD (chronic kidney disease) stage 4, GFR 15-29 ml/min (HCC) Worsening overall Lab Results  Component Value Date   CREATININE 2.11 (H) 04/22/2020   dw pt - she is ok with referral for renal , but not clear to me she will go  Acquired hypothyroidism Lab Results  Component Value Date   TSH 11.50 (H) 04/22/2020   Uncontrolled, , pt to start levothyroxine 50  Followup: Return in about 2 weeks (around 05/09/2020).  Cathlean Cower, MD 04/30/2020 8:18 PM [facility]

## 2020-04-25 NOTE — Assessment & Plan Note (Addendum)
With wet on the left with monthly injections., no longer able to drive

## 2020-04-25 NOTE — Patient Instructions (Signed)
You are given the signed DNR form for out of hospital  Ok to increase the toprol XL to 25 mg per day  Ok to increase the lasix 40 mg to twice per day  Please take all new medication as prescribed - the small dose potassium pill at once per day  Please take at least 6000 units Vit D3 per day  Ok to increase the levothyroxine to 50 mcg per day  Please continue all other medications as before, and refills have been done if requested.  Please have the pharmacy call with any other refills you may need.  Please continue your efforts at being more active, low cholesterol diet, and weight control.  You are otherwise up to date with prevention measures today.  Please keep your appointments with your specialists as you may have planned  You will be contacted regarding the referral for: Nephrology - kidney doctor  Please make an Appointment to return in 2 weeks

## 2020-04-26 ENCOUNTER — Telehealth: Payer: Self-pay | Admitting: Internal Medicine

## 2020-04-26 MED ORDER — AMLODIPINE BESYLATE 5 MG PO TABS: 5.0000 mg | ORAL_TABLET | Freq: Every day | ORAL | 3 refills | Status: DC

## 2020-04-26 NOTE — Telephone Encounter (Signed)
[place], [place] Phone:  [phone]  Fax:  [phone]     amLODipine (NORVASC) 5 MG tablet atorvastatin (LIPITOR) 20 MG tablet furosemide (LASIX) 40 MG tablet metoprolol succinate (TOPROL-XL) 25 MG 24 hr tablet irbesartan (AVAPRO) 300 MG tablet   These were sent to the wrong pharmacy yesterday, patient only gets one of her medications at [facility]

## 2020-04-30 ENCOUNTER — Encounter: Payer: Self-pay | Admitting: Internal Medicine

## 2020-04-30 NOTE — Assessment & Plan Note (Signed)
Age and sex appropriate education and counseling updated with regular exercise and diet Referrals for preventative services - none needed Immunizations addressed - declines tdap Smoking counseling  - none needed Evidence for depression or other mood disorder - + worsening depressive symptoms - declines tx Most recent labs reviewed. I have personally reviewed and have noted: 1) the patient's medical and social history 2) The patient's current medications and supplements 3) The patient's height, weight, and BMI have been recorded in the chart

## 2020-04-30 NOTE — Assessment & Plan Note (Signed)
Lab Results  Component Value Date   HGBA1C 6.3 04/22/2020   Stable, pt to continue current medical treatment  - diet

## 2020-04-30 NOTE — Assessment & Plan Note (Signed)
Lab Results  Component Value Date   TSH 11.50 (H) 04/22/2020   Uncontrolled, , pt to start levothyroxine 50

## 2020-04-30 NOTE — Assessment & Plan Note (Addendum)
BP Readings from Last 3 Encounters:  04/25/20 140/90  10/26/19 (!) 160/90  02/24/18 (!) 142/82   Still mild elevated, pt to continue medical treatment avapro, norvasc but increased toprol xl 25

## 2020-04-30 NOTE — Assessment & Plan Note (Signed)
A large issue today, but clearly does not want medication or referral today, continue to follow

## 2020-04-30 NOTE — Assessment & Plan Note (Signed)
Raised fleshy < 1/2 non healing, non tender lesion likely skin ca- declines dermatology referral

## 2020-04-30 NOTE — Assessment & Plan Note (Signed)
D/w pt, asks for out of hosp DNR form signed to keep in her kitchen to avoid any type of heroic measures per ems if called such as chest compressions, defibrillation, epi and intubation

## 2020-04-30 NOTE — Assessment & Plan Note (Addendum)
Worsening overall Lab Results  Component Value Date   CREATININE 2.11 (H) 04/22/2020   dw pt - she is ok with referral for renal , but not clear to me she will go

## 2020-04-30 NOTE — Assessment & Plan Note (Signed)
Lab Results  Component Value Date   LDLCALC 240 (H) 04/22/2020   Stable, pt delcines to restart statin lipitor

## 2020-04-30 NOTE — Assessment & Plan Note (Signed)
Last vitamin D Lab Results  Component Value Date   VD25OH 20.69 (L) 04/22/2020   Low on 4000 u vit d - to increase the 6000 u qd of VIt D3 oral replacement

## 2020-04-30 NOTE — Assessment & Plan Note (Addendum)
For BNP but unclear she will return to lab for this, for increased lasix 40 bid, kdur 10 qd, rov 2 wks

## 2020-05-04 ENCOUNTER — Telehealth: Payer: Self-pay | Admitting: Internal Medicine

## 2020-05-04 NOTE — Telephone Encounter (Signed)
Patient has been notified to stop the irbestaran Avapro and to be seen in the office after 2 weeks

## 2020-05-04 NOTE — Telephone Encounter (Signed)
Please to contact pt  Pt has been referred to nephrology, and they often review the patients before they are seen in the office  They recommend for now to stop the avapro for this patient, as this may be affecting the kidney function     More than we realize  Ok to have pt stop the avapro and make ROV in 1 -2 wks to check BP and kidney blood testing, thanks

## 2020-05-09 ENCOUNTER — Ambulatory Visit: Payer: Medicare HMO | Admitting: Internal Medicine

## 2020-05-19 ENCOUNTER — Other Ambulatory Visit: Payer: Self-pay

## 2020-05-19 ENCOUNTER — Ambulatory Visit: Payer: Medicare HMO | Admitting: Internal Medicine

## 2020-05-19 DIAGNOSIS — Z0289 Encounter for other administrative examinations: Secondary | ICD-10-CM

## 2020-05-20 ENCOUNTER — Emergency Department (HOSPITAL_COMMUNITY): Payer: Medicare HMO

## 2020-05-20 ENCOUNTER — Telehealth: Payer: Self-pay | Admitting: Internal Medicine

## 2020-05-20 ENCOUNTER — Encounter: Payer: Self-pay | Admitting: Internal Medicine

## 2020-05-20 ENCOUNTER — Inpatient Hospital Stay (HOSPITAL_COMMUNITY)
Admission: EM | Admit: 2020-05-20 | Discharge: 2020-05-27 | DRG: 291 | Disposition: A | Discharge status: Hospice | Payer: Medicare HMO | Attending: Internal Medicine | Admitting: Internal Medicine

## 2020-05-20 ENCOUNTER — Encounter (HOSPITAL_COMMUNITY): Payer: Self-pay

## 2020-05-20 ENCOUNTER — Ambulatory Visit (INDEPENDENT_AMBULATORY_CARE_PROVIDER_SITE_OTHER): Payer: Medicare HMO | Admitting: Internal Medicine

## 2020-05-20 DIAGNOSIS — Z8249 Family history of ischemic heart disease and other diseases of the circulatory system: Secondary | ICD-10-CM | POA: Diagnosis not present

## 2020-05-20 DIAGNOSIS — R29898 Other symptoms and signs involving the musculoskeletal system: Secondary | ICD-10-CM

## 2020-05-20 DIAGNOSIS — Z85828 Personal history of other malignant neoplasm of skin: Secondary | ICD-10-CM | POA: Diagnosis not present

## 2020-05-20 DIAGNOSIS — I132 Hypertensive heart and chronic kidney disease with heart failure and with stage 5 chronic kidney disease, or end stage renal disease: Principal | ICD-10-CM | POA: Diagnosis present

## 2020-05-20 DIAGNOSIS — I4891 Unspecified atrial fibrillation: Secondary | ICD-10-CM | POA: Diagnosis not present

## 2020-05-20 DIAGNOSIS — H3530 Unspecified macular degeneration: Secondary | ICD-10-CM | POA: Diagnosis present

## 2020-05-20 DIAGNOSIS — E785 Hyperlipidemia, unspecified: Secondary | ICD-10-CM | POA: Diagnosis present

## 2020-05-20 DIAGNOSIS — Z7982 Long term (current) use of aspirin: Secondary | ICD-10-CM

## 2020-05-20 DIAGNOSIS — J9811 Atelectasis: Secondary | ICD-10-CM | POA: Diagnosis not present

## 2020-05-20 DIAGNOSIS — E1122 Type 2 diabetes mellitus with diabetic chronic kidney disease: Secondary | ICD-10-CM | POA: Diagnosis present

## 2020-05-20 DIAGNOSIS — J90 Pleural effusion, not elsewhere classified: Secondary | ICD-10-CM | POA: Diagnosis not present

## 2020-05-20 DIAGNOSIS — R601 Generalized edema: Secondary | ICD-10-CM | POA: Diagnosis not present

## 2020-05-20 DIAGNOSIS — I421 Obstructive hypertrophic cardiomyopathy: Secondary | ICD-10-CM | POA: Diagnosis present

## 2020-05-20 DIAGNOSIS — Z833 Family history of diabetes mellitus: Secondary | ICD-10-CM

## 2020-05-20 DIAGNOSIS — Y92002 Bathroom of unspecified non-institutional (private) residence single-family (private) house as the place of occurrence of the external cause: Secondary | ICD-10-CM

## 2020-05-20 DIAGNOSIS — Z79899 Other long term (current) drug therapy: Secondary | ICD-10-CM

## 2020-05-20 DIAGNOSIS — I5023 Acute on chronic systolic (congestive) heart failure: Secondary | ICD-10-CM | POA: Diagnosis not present

## 2020-05-20 DIAGNOSIS — D72820 Lymphocytosis (symptomatic): Secondary | ICD-10-CM | POA: Diagnosis present

## 2020-05-20 DIAGNOSIS — Z856 Personal history of leukemia: Secondary | ICD-10-CM | POA: Diagnosis not present

## 2020-05-20 DIAGNOSIS — I509 Heart failure, unspecified: Secondary | ICD-10-CM | POA: Diagnosis not present

## 2020-05-20 DIAGNOSIS — E1165 Type 2 diabetes mellitus with hyperglycemia: Secondary | ICD-10-CM | POA: Diagnosis not present

## 2020-05-20 DIAGNOSIS — N179 Acute kidney failure, unspecified: Secondary | ICD-10-CM

## 2020-05-20 DIAGNOSIS — I160 Hypertensive urgency: Secondary | ICD-10-CM | POA: Diagnosis not present

## 2020-05-20 DIAGNOSIS — I10 Essential (primary) hypertension: Secondary | ICD-10-CM | POA: Diagnosis not present

## 2020-05-20 DIAGNOSIS — Z8673 Personal history of transient ischemic attack (TIA), and cerebral infarction without residual deficits: Secondary | ICD-10-CM | POA: Diagnosis not present

## 2020-05-20 DIAGNOSIS — Z66 Do not resuscitate: Secondary | ICD-10-CM | POA: Diagnosis present

## 2020-05-20 DIAGNOSIS — F419 Anxiety disorder, unspecified: Secondary | ICD-10-CM | POA: Diagnosis not present

## 2020-05-20 DIAGNOSIS — I350 Nonrheumatic aortic (valve) stenosis: Secondary | ICD-10-CM | POA: Diagnosis not present

## 2020-05-20 DIAGNOSIS — Z885 Allergy status to narcotic agent status: Secondary | ICD-10-CM | POA: Diagnosis not present

## 2020-05-20 DIAGNOSIS — C9110 Chronic lymphocytic leukemia of B-cell type not having achieved remission: Secondary | ICD-10-CM | POA: Diagnosis not present

## 2020-05-20 DIAGNOSIS — Z9889 Other specified postprocedural states: Secondary | ICD-10-CM

## 2020-05-20 DIAGNOSIS — N184 Chronic kidney disease, stage 4 (severe): Secondary | ICD-10-CM | POA: Diagnosis not present

## 2020-05-20 DIAGNOSIS — R0600 Dyspnea, unspecified: Secondary | ICD-10-CM | POA: Diagnosis not present

## 2020-05-20 DIAGNOSIS — R079 Chest pain, unspecified: Secondary | ICD-10-CM

## 2020-05-20 DIAGNOSIS — R778 Other specified abnormalities of plasma proteins: Secondary | ICD-10-CM | POA: Diagnosis not present

## 2020-05-20 DIAGNOSIS — I5033 Acute on chronic diastolic (congestive) heart failure: Secondary | ICD-10-CM | POA: Diagnosis present

## 2020-05-20 DIAGNOSIS — R531 Weakness: Secondary | ICD-10-CM | POA: Diagnosis not present

## 2020-05-20 DIAGNOSIS — R5383 Other fatigue: Secondary | ICD-10-CM | POA: Diagnosis not present

## 2020-05-20 DIAGNOSIS — Z20822 Contact with and (suspected) exposure to covid-19: Secondary | ICD-10-CM | POA: Diagnosis present

## 2020-05-20 DIAGNOSIS — E7800 Pure hypercholesterolemia, unspecified: Secondary | ICD-10-CM | POA: Diagnosis not present

## 2020-05-20 DIAGNOSIS — J948 Other specified pleural conditions: Secondary | ICD-10-CM | POA: Diagnosis not present

## 2020-05-20 DIAGNOSIS — W1830XA Fall on same level, unspecified, initial encounter: Secondary | ICD-10-CM | POA: Diagnosis present

## 2020-05-20 DIAGNOSIS — R3121 Asymptomatic microscopic hematuria: Secondary | ICD-10-CM

## 2020-05-20 DIAGNOSIS — F32A Depression, unspecified: Secondary | ICD-10-CM | POA: Diagnosis present

## 2020-05-20 DIAGNOSIS — R530 Neoplastic (malignant) related fatigue: Secondary | ICD-10-CM | POA: Diagnosis not present

## 2020-05-20 DIAGNOSIS — N048 Nephrotic syndrome with other morphologic changes: Secondary | ICD-10-CM | POA: Diagnosis present

## 2020-05-20 DIAGNOSIS — Z87891 Personal history of nicotine dependence: Secondary | ICD-10-CM | POA: Diagnosis not present

## 2020-05-20 DIAGNOSIS — I5031 Acute diastolic (congestive) heart failure: Secondary | ICD-10-CM | POA: Diagnosis not present

## 2020-05-20 DIAGNOSIS — I517 Cardiomegaly: Secondary | ICD-10-CM | POA: Diagnosis not present

## 2020-05-20 DIAGNOSIS — Z515 Encounter for palliative care: Secondary | ICD-10-CM

## 2020-05-20 DIAGNOSIS — R0602 Shortness of breath: Secondary | ICD-10-CM

## 2020-05-20 DIAGNOSIS — D509 Iron deficiency anemia, unspecified: Secondary | ICD-10-CM | POA: Diagnosis present

## 2020-05-20 DIAGNOSIS — Z79890 Hormone replacement therapy (postmenopausal): Secondary | ICD-10-CM

## 2020-05-20 DIAGNOSIS — J811 Chronic pulmonary edema: Secondary | ICD-10-CM | POA: Diagnosis not present

## 2020-05-20 DIAGNOSIS — Z880 Allergy status to penicillin: Secondary | ICD-10-CM | POA: Diagnosis not present

## 2020-05-20 DIAGNOSIS — I499 Cardiac arrhythmia, unspecified: Secondary | ICD-10-CM | POA: Diagnosis not present

## 2020-05-20 DIAGNOSIS — E039 Hypothyroidism, unspecified: Secondary | ICD-10-CM | POA: Diagnosis present

## 2020-05-20 DIAGNOSIS — G9341 Metabolic encephalopathy: Secondary | ICD-10-CM | POA: Diagnosis not present

## 2020-05-20 DIAGNOSIS — R768 Other specified abnormal immunological findings in serum: Secondary | ICD-10-CM | POA: Diagnosis present

## 2020-05-20 DIAGNOSIS — R4182 Altered mental status, unspecified: Secondary | ICD-10-CM | POA: Diagnosis not present

## 2020-05-20 DIAGNOSIS — Z803 Family history of malignant neoplasm of breast: Secondary | ICD-10-CM

## 2020-05-20 DIAGNOSIS — E119 Type 2 diabetes mellitus without complications: Secondary | ICD-10-CM

## 2020-05-20 DIAGNOSIS — T502X6A Underdosing of carbonic-anhydrase inhibitors, benzothiadiazides and other diuretics, initial encounter: Secondary | ICD-10-CM | POA: Diagnosis present

## 2020-05-20 DIAGNOSIS — R29818 Other symptoms and signs involving the nervous system: Secondary | ICD-10-CM | POA: Diagnosis not present

## 2020-05-20 DIAGNOSIS — I5030 Unspecified diastolic (congestive) heart failure: Secondary | ICD-10-CM | POA: Diagnosis not present

## 2020-05-20 DIAGNOSIS — M7989 Other specified soft tissue disorders: Secondary | ICD-10-CM | POA: Diagnosis not present

## 2020-05-20 DIAGNOSIS — R0902 Hypoxemia: Secondary | ICD-10-CM | POA: Diagnosis present

## 2020-05-20 DIAGNOSIS — N281 Cyst of kidney, acquired: Secondary | ICD-10-CM | POA: Diagnosis not present

## 2020-05-20 LAB — CBC WITH DIFFERENTIAL/PLATELET
Abs Immature Granulocytes: 0.06 10*3/uL (ref 0.00–0.07)
Basophils Absolute: 0.1 10*3/uL (ref 0.0–0.1)
Basophils Relative: 0 %
Eosinophils Absolute: 0.1 10*3/uL (ref 0.0–0.5)
Eosinophils Relative: 1 %
HCT: 35.2 % — ABNORMAL LOW (ref 36.0–46.0)
Hemoglobin: 11 g/dL — ABNORMAL LOW (ref 12.0–15.0)
Immature Granulocytes: 0 %
Lymphocytes Relative: 21 %
Lymphs Abs: 2.9 10*3/uL (ref 0.7–4.0)
MCH: 30.6 pg (ref 26.0–34.0)
MCHC: 31.3 g/dL (ref 30.0–36.0)
MCV: 97.8 fL (ref 80.0–100.0)
Monocytes Absolute: 1 10*3/uL (ref 0.1–1.0)
Monocytes Relative: 7 %
Neutro Abs: 10 10*3/uL — ABNORMAL HIGH (ref 1.7–7.7)
Neutrophils Relative %: 71 %
Platelets: 305 10*3/uL (ref 150–400)
RBC: 3.6 MIL/uL — ABNORMAL LOW (ref 3.87–5.11)
RDW: 13.6 % (ref 11.5–15.5)
WBC: 14.1 10*3/uL — ABNORMAL HIGH (ref 4.0–10.5)
nRBC: 0 % (ref 0.0–0.2)

## 2020-05-20 LAB — TROPONIN I (HIGH SENSITIVITY)
Troponin I (High Sensitivity): 56 ng/L — ABNORMAL HIGH (ref <18)
Troponin I (High Sensitivity): 52 ng/L — ABNORMAL HIGH (ref <18)

## 2020-05-20 LAB — URINALYSIS, ROUTINE W REFLEX MICROSCOPIC
Bilirubin Urine: NEGATIVE
Glucose, UA: 50 mg/dL — AB
Ketones, ur: NEGATIVE mg/dL
Leukocytes,Ua: NEGATIVE
Nitrite: NEGATIVE
Protein, ur: >=300 mg/dL — AB
RBC / HPF: >50 RBC/hpf — ABNORMAL HIGH (ref 0–5)
Specific Gravity, Urine: 1.008 (ref 1.005–1.030)
pH: 7 (ref 5.0–8.0)

## 2020-05-20 LAB — BRAIN NATRIURETIC PEPTIDE: B Natriuretic Peptide: 1151.9 pg/mL — ABNORMAL HIGH (ref 0.0–100.0)

## 2020-05-20 LAB — BASIC METABOLIC PANEL
Anion gap: 8 (ref 5–15)
BUN: 41 mg/dL — ABNORMAL HIGH (ref 8–23)
CO2: 26 mmol/L (ref 22–32)
Calcium: 7.9 mg/dL — ABNORMAL LOW (ref 8.9–10.3)
Chloride: 108 mmol/L (ref 98–111)
Creatinine, Ser: 2.66 mg/dL — ABNORMAL HIGH (ref 0.44–1.00)
GFR, Estimated: 17 mL/min — ABNORMAL LOW (ref >60)
Glucose, Bld: 121 mg/dL — ABNORMAL HIGH (ref 70–99)
Potassium: 4 mmol/L (ref 3.5–5.1)
Sodium: 142 mmol/L (ref 135–145)

## 2020-05-20 LAB — PROTIME-INR
INR: 0.9 (ref 0.8–1.2)
Prothrombin Time: 12.1 seconds (ref 11.4–15.2)

## 2020-05-20 LAB — APTT: aPTT: 37 seconds — ABNORMAL HIGH (ref 24–36)

## 2020-05-20 LAB — MAGNESIUM: Magnesium: 1.7 mg/dL (ref 1.7–2.4)

## 2020-05-20 LAB — TSH: TSH: 5.745 u[IU]/mL — ABNORMAL HIGH (ref 0.350–4.500)

## 2020-05-20 LAB — GLUCOSE, CAPILLARY: Glucose-Capillary: 115 mg/dL — ABNORMAL HIGH (ref 70–99)

## 2020-05-20 MED ORDER — FUROSEMIDE 10 MG/ML IJ SOLN
40.0000 mg | Freq: Two times a day (BID) | INTRAMUSCULAR | Status: DC
  Administered 2020-05-21 (×2): 40 mg via INTRAVENOUS
  Filled 2020-05-20 (×2): qty 4

## 2020-05-20 MED ORDER — ACETAMINOPHEN 325 MG PO TABS
650.0000 mg | ORAL_TABLET | ORAL | Status: DC | PRN
  Administered 2020-05-21 – 2020-05-24 (×2): 650 mg via ORAL
  Filled 2020-05-20 (×2): qty 2

## 2020-05-20 MED ORDER — SODIUM CHLORIDE 0.9% FLUSH: 3.0000 mL | INTRAVENOUS | Status: DC | PRN

## 2020-05-20 MED ORDER — ONDANSETRON HCL 4 MG/2ML IJ SOLN
4.0000 mg | Freq: Four times a day (QID) | INTRAMUSCULAR | Status: DC | PRN
  Administered 2020-05-25: 4 mg via INTRAVENOUS
  Filled 2020-05-20: qty 2

## 2020-05-20 MED ORDER — FUROSEMIDE 10 MG/ML IJ SOLN
40.0000 mg | Freq: Once | INTRAMUSCULAR | Status: AC
  Administered 2020-05-20: 40 mg via INTRAVENOUS
  Filled 2020-05-20: qty 4

## 2020-05-20 MED ORDER — LEVOTHYROXINE SODIUM 25 MCG PO TABS
37.5000 ug | ORAL_TABLET | Freq: Every day | ORAL | Status: DC
  Administered 2020-05-21 – 2020-05-27 (×6): 37.5 ug via ORAL
  Filled 2020-05-20 (×6): qty 2

## 2020-05-20 MED ORDER — METOPROLOL TARTRATE 25 MG PO TABS
25.0000 mg | ORAL_TABLET | Freq: Once | ORAL | Status: AC
  Administered 2020-05-20: 25 mg via ORAL
  Filled 2020-05-20: qty 1

## 2020-05-20 MED ORDER — POTASSIUM CHLORIDE CRYS ER 20 MEQ PO TBCR
20.0000 meq | EXTENDED_RELEASE_TABLET | Freq: Two times a day (BID) | ORAL | Status: DC
  Administered 2020-05-20 – 2020-05-22 (×4): 20 meq via ORAL
  Filled 2020-05-20 (×4): qty 1

## 2020-05-20 MED ORDER — SODIUM CHLORIDE 0.9 % IV SOLN: 250.0000 mL | INTRAVENOUS | Status: DC | PRN

## 2020-05-20 MED ORDER — INSULIN ASPART 100 UNIT/ML IJ SOLN
0.0000 [IU] | Freq: Three times a day (TID) | INTRAMUSCULAR | Status: DC
  Administered 2020-05-21: 2 [IU] via SUBCUTANEOUS
  Administered 2020-05-21: 1 [IU] via SUBCUTANEOUS
  Administered 2020-05-22: 2 [IU] via SUBCUTANEOUS
  Administered 2020-05-22 – 2020-05-23 (×2): 1 [IU] via SUBCUTANEOUS
  Filled 2020-05-20: qty 0.09

## 2020-05-20 MED ORDER — FERROUS SULFATE 325 (65 FE) MG PO TABS
325.0000 mg | ORAL_TABLET | Freq: Two times a day (BID) | ORAL | Status: DC
  Administered 2020-05-20 – 2020-05-25 (×10): 325 mg via ORAL
  Filled 2020-05-20 (×10): qty 1

## 2020-05-20 MED ORDER — ASPIRIN EC 81 MG PO TBEC
81.0000 mg | DELAYED_RELEASE_TABLET | Freq: Every day | ORAL | Status: DC
  Administered 2020-05-21 – 2020-05-26 (×6): 81 mg via ORAL
  Filled 2020-05-20 (×6): qty 1

## 2020-05-20 MED ORDER — AMLODIPINE BESYLATE 5 MG PO TABS
5.0000 mg | ORAL_TABLET | Freq: Every day | ORAL | Status: DC
  Administered 2020-05-20 – 2020-05-22 (×3): 5 mg via ORAL
  Filled 2020-05-20 (×3): qty 1

## 2020-05-20 MED ORDER — SODIUM CHLORIDE 0.9% FLUSH
3.0000 mL | Freq: Two times a day (BID) | INTRAVENOUS | Status: DC
  Administered 2020-05-21 – 2020-05-26 (×11): 3 mL via INTRAVENOUS

## 2020-05-20 MED ORDER — METOPROLOL SUCCINATE ER 25 MG PO TB24
25.0000 mg | ORAL_TABLET | Freq: Every day | ORAL | Status: DC
  Administered 2020-05-21 – 2020-05-26 (×6): 25 mg via ORAL
  Filled 2020-05-20 (×6): qty 1

## 2020-05-20 MED ORDER — DIAZEPAM 5 MG PO TABS
2.5000 mg | ORAL_TABLET | Freq: Every day | ORAL | Status: DC
  Administered 2020-05-21 – 2020-05-24 (×4): 2.5 mg via ORAL
  Filled 2020-05-20 (×4): qty 1

## 2020-05-20 MED ORDER — HEPARIN (PORCINE) 25000 UT/250ML-% IV SOLN
950.0000 [IU]/h | INTRAVENOUS | Status: DC
  Administered 2020-05-20: 750 [IU]/h via INTRAVENOUS
  Filled 2020-05-20: qty 250

## 2020-05-20 MED ORDER — HEPARIN BOLUS VIA INFUSION
2000.0000 [IU] | Freq: Once | INTRAVENOUS | Status: AC
  Administered 2020-05-20: 2000 [IU] via INTRAVENOUS
  Filled 2020-05-20: qty 2000

## 2020-05-20 NOTE — Assessment & Plan Note (Signed)
She did not stop irbesartan but concerning in the setting of new edema and SOB. Advised to go to ER for labs, CXR and likely admission for diuresis.

## 2020-05-20 NOTE — ED Notes (Signed)
Coming from PCP-multiple complaints, weakness

## 2020-05-20 NOTE — Progress Notes (Signed)
   Subjective:   Patient ID: Norma Smith, female    DOB: Nov 27, 1933, 87 y.o.   MRN: [id number]  HPI The patient is an [name] coming in for several concerning problems. She is having new/worsening SOB (worse in the last several months she is having new swelling in the legs and left side worse in the same time, hard to lie flat) and new kidney failure (labs recently with Cr 2 from 1 about 1-2 years ago, she denies change in medications, not taking nsaids, she was asked to stop irbesartan and referral to nephrology from PCP and they did not due to miscommunication, taking lasix but only once a day likely), and heart failure (stage 2 diastolic with hypertrophic CM on last ECHO, did not like cardiologist and stopped going long ago, denies new chest pain but severe SOB and cannot even walk 10 feet without stopping) and recent fall (yesterday fell in bathroom, made it to kitchen in 3 hours, called daughter then, EMS came and helped her up, BP very high then, advised to go to ER and declined, she is sore today but otherwise okay, some headache, denies confusion, daughter agrees).   Is still taking irbesartan/avapro due to miscommunication she stopped lipitor instead (atorvastatin).   Review of Systems  Constitutional: Positive for activity change, appetite change, fatigue and unexpected weight change.  HENT: Negative.   Eyes: Negative.   Respiratory: Positive for shortness of breath. Negative for cough and chest tightness.   Cardiovascular: Positive for leg swelling. Negative for chest pain and palpitations.  Gastrointestinal: Negative for abdominal distention, abdominal pain, constipation, diarrhea, nausea and vomiting.  Musculoskeletal: Negative.   Skin: Negative.   Neurological: Positive for weakness and light-headedness.  Psychiatric/Behavioral: Negative.     Objective:  Physical Exam Constitutional:      Appearance: She is well-developed. She is ill-appearing.  HENT:     Head:  Normocephalic and atraumatic.  Cardiovascular:     Rate and Rhythm: Normal rate and regular rhythm.     Heart sounds: Murmur heard.    Pulmonary:     Effort: Respiratory distress present.     Breath sounds: Rales present. No wheezing.     Comments: SOB with exertion of any sort, speaking in full sentences Abdominal:     General: Bowel sounds are normal. There is no distension.     Palpations: Abdomen is soft.     Tenderness: There is no abdominal tenderness. There is no rebound.  Musculoskeletal:     Cervical back: Normal range of motion.  Skin:    General: Skin is warm and dry.  Neurological:     Mental Status: She is alert and oriented to person, place, and time.     Coordination: Coordination abnormal.     Vitals:   05/20/20 0939  BP: (!) 158/76  Pulse: 84  Resp: 18  Temp: 98.3 F (36.8 C)  TempSrc: Oral  SpO2: 94%  Height: 5\' 1"  (1.549 m)    This visit occurred during the SARS-CoV-2 public health emergency.  Safety protocols were in place, including screening questions prior to the visit, additional usage of staff PPE, and extensive cleaning of exam room while observing appropriate contact time as indicated for disinfecting solutions.   Assessment & Plan:

## 2020-05-20 NOTE — ED Provider Notes (Signed)
[facility] DEPT Provider Note   CSN: [id number] Arrival date & time: 05/20/20  1041     History Chief Complaint  Patient presents with  . Weakness  . Leg Swelling    Norma Smith is a 87 y.o. female.  HPI    87 year old female comes in with chief complaint of weakness and leg swelling and shortness of breath. She has history of aortic stenosis, cardiomyopathy, diabetes and CKD.  Patient had a stroke in 1999.  According to the patient, over the last 2 months she has had progressive shortness of breath.  Her shortness of breath is exertional.  Now with just getting out of the bed she gets short of breath.  Few months back she was able to shop at a supermarket without any problems.  With this she is not having any new orthopnea, PND, chest pain.  Patient is however having intermittent palpitations and has had feelings of dizziness.  In addition, patient reports that she feels that the left side feels more swollen to her than right side.  She has also felt little weaker over the left side.   Patient lives by herself.  She was seen by PCP today, they advised that she come to the hospital for what appeared to them as volume overload.  Past Medical History:  Diagnosis Date  . Allergy   . Anemia   . Aortic stenosis   . Aortic stenosis, moderate 06/16/2011  . Blood transfusion without reported diagnosis 2010?  Marland Kitchen Cardiomyopathy ([place])   . CLL (chronic lymphoblastic leukemia) 12/01/2010  . Diabetes mellitus   . Dry skin dermatitis 12/01/2010  . Hyperlipidemia   . Hypertension   . Iron deficiency 12/01/2010  . Monoclonal B-cell lymphocytosis of unknown significance 12/02/2014  . Osteopenia   . Skin cancer   . Stroke [facility])    1999    Patient Active Problem List   Diagnosis Date Noted  . Skin lesion 04/25/2020  . Breast mass in female 04/25/2020  . Macular degeneration of both eyes 04/25/2020  . Vitamin D deficiency 10/26/2019  . Shingles  08/11/2016  . Gastritis 08/08/2016  . GI bleeding 08/01/2016  . Symptomatic anemia   . Acquired hypothyroidism 03/13/2016  . DNR (do not resuscitate) discussion 03/13/2016  . CKD (chronic kidney disease) stage 4, GFR 15-29 ml/min (HCC) 08/25/2015  . Monoclonal B-cell lymphocytosis of unknown significance 12/02/2014  . Asymptomatic microscopic hematuria 12/02/2014  . Anemia of chronic kidney failure 12/02/2014  . Peripheral edema 02/03/2013  . Post-menopausal bleeding 02/03/2013  . Esophageal reflux 02/03/2013  . Constipation 02/03/2013  . Anxiety 01/04/2013  . AKI (acute kidney injury) ([place]) 12/15/2012  . Acute fulminating appendicitis with perforation and peritonitis 12/14/2012  . Depression 06/11/2012  . Daytime hypersomnolence 06/11/2012  . Hypertrophic obstructive cardiomyopathy ([place]) 06/20/2011  . Bruit 06/20/2011  . Claudication ([place]) 06/20/2011  . Aortic stenosis, moderate 06/16/2011  . Vertigo 12/02/2010  . Iron deficiency 12/01/2010  . Dry skin dermatitis 12/01/2010  . CLL (chronic lymphocytic leukemia) ([place]) 12/01/2010  . Fatigue 12/01/2010  . Allergy   . Colon polyps   . Diabetes ([place])   . Hyperlipidemia   . Hypertension   . Anemia   . Osteopenia   . Encounter for well adult exam with abnormal findings 11/24/2010    Past Surgical History:  Procedure Laterality Date  . APPENDECTOMY    . BREAST BIOPSY  1976  . CATARACT EXTRACTION    . COLONOSCOPY WITH PROPOFOL Left 08/03/2016  Procedure: COLONOSCOPY WITH PROPOFOL;  Surgeon: Otis Brace, MD;  Location: [facility];  Service: Gastroenterology;  Laterality: Left;  . ESOPHAGOGASTRODUODENOSCOPY (EGD) WITH PROPOFOL Left 08/03/2016   Procedure: ESOPHAGOGASTRODUODENOSCOPY (EGD) WITH PROPOFOL;  Surgeon: Otis Brace, MD;  Location: [facility];  Service: Gastroenterology;  Laterality: Left;  . LAPAROSCOPIC APPENDECTOMY N/A 12/13/2012   Procedure: APPENDECTOMY LAPAROSCOPIC;  Surgeon: Odis Hollingshead, MD;   Location: [facility];  Service: General;  Laterality: N/A;     OB History   No obstetric history on file.     Family History  Problem Relation Age of Onset  . Diabetes Mother   . Diabetes Other   . Cancer Other        breast cancer  . Heart disease Other   . Diabetes Other   . Cancer Maternal Aunt        breast  . Heart disease Maternal Aunt     Social History   Tobacco Use  . Smoking status: Former Research scientist (life sciences)  . Smokeless tobacco: Never Used  . Tobacco comment: Stopped smoking 5 yrs. ago.  Vaping Use  . Vaping Use: Never used  Substance Use Topics  . Alcohol use: No    Comment: Occasional  . Drug use: No    Home Medications Prior to Admission medications   Medication Sig Start Date End Date Taking? Authorizing Provider  amLODipine (NORVASC) 5 MG tablet Take 1 tablet (5 mg total) by mouth daily. 04/26/20  Yes Biagio Borg, MD  aspirin 81 MG tablet Take 1 tablet (81 mg total) by mouth daily. 08/21/16  Yes Buriev, Arie Sabina, MD  Cholecalciferol (D3 PO) Take 1 capsule by mouth daily.   Yes [provider]  diazepam (VALIUM) 5 MG tablet 1/2 tab by mouth daily as needed Patient taking differently: Take 2.5 mg by mouth daily. 10/26/19  Yes Biagio Borg, MD  Ferrous Sulfate (IRON) 325 (65 FE) MG TABS Take 325 mg by mouth 2 (two) times daily.   Yes [provider]  furosemide (LASIX) 40 MG tablet Take 1 tablet (40 mg total) by mouth 2 (two) times daily. 04/25/20  Yes Biagio Borg, MD  levothyroxine (SYNTHROID) 50 MCG tablet Take 1 tablet (50 mcg total) by mouth daily. 04/25/20  Yes Biagio Borg, MD  metoprolol succinate (TOPROL-XL) 25 MG 24 hr tablet TAKE 1 tab by mouth once daily Patient taking differently: Take 25 mg by mouth daily. 04/25/20  Yes Biagio Borg, MD  Multiple Vitamins-Minerals (CENTRUM SILVER PO) Take 1 tablet by mouth daily.   Yes [provider]  Multiple Vitamins-Minerals (PRESERVISION AREDS 2 PO) Take 1 tablet by mouth 2 (two) times daily.    Yes [provider]  Omega 3-6-9 CAPS Take 1 capsule by mouth daily.   Yes [provider]  potassium chloride (KLOR-CON) 10 MEQ tablet Take 1 tablet (10 mEq total) by mouth daily. 04/25/20  Yes Biagio Borg, MD  VITAMIN D PO Take 1 tablet by mouth daily.   Yes [provider]  atorvastatin (LIPITOR) 20 MG tablet Take 1 tablet (20 mg total) by mouth daily. Patient not taking: No sig reported 12/16/19   Biagio Borg, MD  glucose blood test strip Used to test blood sugar 3 times daily 03/01/15   Biagio Borg, MD    Allergies    Codeine, Oxycodone, Roxanol [morphine], and Penicillins  Review of Systems   Review of Systems  Constitutional: Positive for activity change.  Respiratory: Positive  for shortness of breath.   Cardiovascular: Negative for chest pain.  Gastrointestinal: Negative for nausea and vomiting.  Neurological: Positive for dizziness and weakness.  All other systems reviewed and are negative.   Physical Exam Updated Vital Signs BP (!) 183/78 (BP Location: Right Arm)   Pulse 78   Temp 98.2 F (36.8 C) (Oral)   Resp 18   SpO2 94%   Physical Exam Vitals and nursing note reviewed.  Constitutional:      Appearance: She is well-developed.  HENT:     Head: Normocephalic and atraumatic.  Cardiovascular:     Rate and Rhythm: Normal rate. Rhythm irregular.  Pulmonary:     Effort: Pulmonary effort is normal.  Abdominal:     General: Bowel sounds are normal.  Musculoskeletal:     Cervical back: Normal range of motion and neck supple.     Right lower leg: Edema present.     Left lower leg: Edema present.  Skin:    General: Skin is warm and dry.  Neurological:     Mental Status: She is alert and oriented to person, place, and time.     Cranial Nerves: No cranial nerve deficit.     Sensory: No sensory deficit.     Motor: Weakness present.     Coordination: Coordination normal.     Comments: Left lower extremity strength is 4/5, R side is  4+/5     ED Results / Procedures / Treatments   Labs (all labs ordered are listed, but only abnormal results are displayed) Labs Reviewed  CBC WITH DIFFERENTIAL/PLATELET - Abnormal; Notable for the following components:      Result Value   WBC 14.1 (*)    RBC 3.60 (*)    Hemoglobin 11.0 (*)    HCT 35.2 (*)    Neutro Abs 10.0 (*)    All other components within normal limits  BASIC METABOLIC PANEL - Abnormal; Notable for the following components:   Glucose, Bld 121 (*)    BUN 41 (*)    Creatinine, Ser 2.66 (*)    Calcium 7.9 (*)    GFR, Estimated 17 (*)    All other components within normal limits  BRAIN NATRIURETIC PEPTIDE - Abnormal; Notable for the following components:   B Natriuretic Peptide 1,151.9 (*)    All other components within normal limits  TSH - Abnormal; Notable for the following components:   TSH 5.745 (*)    All other components within normal limits  APTT - Abnormal; Notable for the following components:   aPTT 37 (*)    All other components within normal limits  TROPONIN I (HIGH SENSITIVITY) - Abnormal; Notable for the following components:   Troponin I (High Sensitivity) 56 (*)    All other components within normal limits  SARS CORONAVIRUS 2 (TAT 6-24 HRS)  MAGNESIUM  PROTIME-INR  URINALYSIS, ROUTINE W REFLEX MICROSCOPIC  HEPARIN LEVEL (UNFRACTIONATED)  TROPONIN I (HIGH SENSITIVITY)    EKG EKG Interpretation  Date/Time:  Friday May 20 2020 13:29:33 EDT Ventricular Rate:  83 PR Interval:    QRS Duration: 94 QT Interval:  394 QTC Calculation: 462 R Axis:   64 Text Interpretation: Atrial fibrillation Nonspecific T wave abnormality Abnormal ECG afib is new Confirmed by Varney Biles [phone]) on 05/20/2020 2:37:43 PM   Radiology CT Head Wo Contrast  Result Date: 05/20/2020 CLINICAL DATA:  Neuro deficit, fatigue, lower extremity swelling. EXAM: CT HEAD WITHOUT CONTRAST TECHNIQUE: Contiguous axial images were obtained from the base  of the skull  through the vertex without intravenous contrast. COMPARISON:  None. FINDINGS: Brain: Mild chronic small vessel ischemic changes within the deep periventricular white matter regions bilaterally. No mass, hemorrhage, edema or other evidence of acute parenchymal abnormality. No extra-axial hemorrhage. Vascular: Chronic calcified atherosclerotic changes of the large vessels at the skull base. No unexpected hyperdense vessel. Skull: Normal. Negative for fracture or focal lesion. Sinuses/Orbits: No acute finding. Other: None. IMPRESSION: 1. No acute findings. No intracranial mass, hemorrhage or edema. 2. Mild chronic small vessel ischemic changes in the white matter. Electronically Signed   By: Franki Cabot M.D.   On: 05/20/2020 16:24   DG Chest Port 1 View  Result Date: 05/20/2020 CLINICAL DATA:  Lower extremity edema, increasing fatigue EXAM: PORTABLE CHEST 1 VIEW COMPARISON:  07/30/2017 FINDINGS: Single frontal view of the chest demonstrates an enlarged cardiac silhouette, with marked calcification of the mitral annulus. There is a moderate left pleural effusion, with underlying consolidation at the left lung base. Trace right pleural fluid is noted. Increased central vascular congestion. No pneumothorax. IMPRESSION: 1. Dense consolidation at the left lung base, with moderate left pleural effusion. 2. Trace right pleural fluid. 3. Increased central vascular congestion. 4. Enlarged cardiac silhouette, with dense calcification of the mitral annulus. 5. While the constellation of findings is most consistent with fluid overload, close follow-up is warranted to evaluate for resolution of the dense consolidation at the left lung base. Electronically Signed   By: Randa Ngo M.D.   On: 05/20/2020 15:46    Procedures Procedures   Medications Ordered in ED Medications  heparin ADULT infusion 100 units/mL (25000 units/274mL) (750 Units/hr Intravenous New Bag/Given 05/20/20 1537)  furosemide (LASIX) injection 40  mg (40 mg Intravenous Given 05/20/20 1529)  heparin bolus via infusion 2,000 Units (2,000 Units Intravenous Bolus from Bag 05/20/20 1537)  metoprolol tartrate (LOPRESSOR) tablet 25 mg (25 mg Oral Given 05/20/20 1627)    ED Course  I have reviewed the triage vital signs and the nursing notes.  Pertinent labs & imaging results that were available during my care of the patient were reviewed by me and considered in my medical decision making (see chart for details).    MDM Rules/Calculators/A&P                          87 year old female comes in a chief complaint of shortness of breath. It appears that she has had slow progression over the last 2 or 3 months.  Patient lives by herself, but unable to complete ADLs now because of her shortness of breath.  On exam, she is also noted to have left-sided lower extremity weakness along with pitting edema bilaterally. The lung exam revealed slightly reduced lung sounds on the left side, chest x-ray confirms left-sided pleural effusion and what appears to be interstitial edema.  Initial thought is that patient is having symptomatic A. Fib. She also likely has new onset CHF.  It is unclear if the A. fib is as result of CHF or driving the CHF.  Lab also reveals rising creatinine which is likely prerenal.  Fortunately, heart rate is not well controlled right now.  Blood pressure is elevated however, we will give her oral metoprolol.  IV heparin was initiated after the CT head did not reveal any brain bleed.  The other possibility we thought about was stroke with left-sided lower extremity weakness.  CT head is negative, but if patient does have marked weakness  of the left lower extremity with PT-OT then she might need an MRI of the brain due to risk of cryptogenic stroke.  Final Clinical Impression(s) / ED Diagnoses Final diagnoses:  Atrial fibrillation, unspecified type ([place])  Shortness of breath  Pleural effusion  Left leg weakness    Rx / DC  Orders ED Discharge Orders         Ordered    Amb referral to AFIB Clinic        05/20/20 [place], [place], MD 05/20/20 1720

## 2020-05-20 NOTE — H&P (Signed)
History and Physical    Doneisha Ivey DGU:[id number] DOB: April 21, 1933 DOA: 05/20/2020  PCP: Hoyt Koch, MD  Patient coming from: ILF ([facility])  Chief Complaint: Shortness of breath  HPI: Sheana Bir is a 87 y.o. female with medical history significant of iron deficiency anemia, aortic stenosis, hypertrophic cardiomyopathy, anxiety, CKD stage IV, diabetes mellitus, hyperlipidemia, hypertension, macular degeneration, hypothyroidism. Patient presents with worsening dyspnea over the past few weeks. She notices her dyspnea mostly when lying down flat and trying to turn. She reports taking her lasix but does not take it every day as directed. She has associated dyspnea on exertion. She reports a fall while getting out of her shower yesterday without loss of consciousness.  ED Course: Vitals: Temperature 98.2 F, pulse 72 bpm, respirations 20/min, blood pressure 201/79, SPO2 97% on room air Labs: BUN of 41, creatinine 2.66, WBC 14.1K, hemoglobin 11.0, BNP 1151 urinalysis significant for protein greater than 300, 50 glucose 50, RBC greater than 50, hyaline casts Imaging: CT head significant for no acute findings and chronic small vessel ischemic changes Medications/Course: Lasix 40 mg IV, heparin IV infusion, Toprol tartrate 25 mg p.o. x1  Review of Systems: Review of Systems  Constitutional: Negative for chills and fever.  Respiratory: Positive for cough (non-productive) and shortness of breath.   Cardiovascular: Positive for chest pain, orthopnea and leg swelling. Negative for PND.  Gastrointestinal: Negative for abdominal pain, constipation, diarrhea, nausea and vomiting.  Neurological: Positive for focal weakness. Negative for sensory change and loss of consciousness.  All other systems reviewed and are negative.   Past Medical History:  Diagnosis Date  . Allergy   . Anemia   . Aortic stenosis   . Aortic stenosis, moderate 06/16/2011  . Blood transfusion without reported  diagnosis 2010?  Marland Kitchen Cardiomyopathy ([place])   . CLL (chronic lymphoblastic leukemia) 12/01/2010  . Diabetes mellitus   . Dry skin dermatitis 12/01/2010  . Hyperlipidemia   . Hypertension   . Iron deficiency 12/01/2010  . Monoclonal B-cell lymphocytosis of unknown significance 12/02/2014  . Osteopenia   . Skin cancer   . Stroke [facility])    1999    Past Surgical History:  Procedure Laterality Date  . APPENDECTOMY    . BREAST BIOPSY  1976  . CATARACT EXTRACTION    . COLONOSCOPY WITH PROPOFOL Left 08/03/2016   Procedure: COLONOSCOPY WITH PROPOFOL;  Surgeon: Otis Brace, MD;  Location: [place];  Service: Gastroenterology;  Laterality: Left;  . ESOPHAGOGASTRODUODENOSCOPY (EGD) WITH PROPOFOL Left 08/03/2016   Procedure: ESOPHAGOGASTRODUODENOSCOPY (EGD) WITH PROPOFOL;  Surgeon: Otis Brace, MD;  Location: [facility];  Service: Gastroenterology;  Laterality: Left;  . LAPAROSCOPIC APPENDECTOMY N/A 12/13/2012   Procedure: APPENDECTOMY LAPAROSCOPIC;  Surgeon: Odis Hollingshead, MD;  Location: [facility];  Service: General;  Laterality: N/A;     reports that she has quit smoking. She has never used smokeless tobacco. She reports that she does not drink alcohol and does not use drugs.  Allergies  Allergen Reactions  . Codeine Other (See Comments)    Passed out  . Oxycodone Other (See Comments)    Visual Hallucinations  . Roxanol [Morphine] Hives and Rash  . Penicillins Rash    Has patient had a PCN reaction causing immediate rash, facial/tongue/throat swelling, SOB or lightheadedness with hypotension: Yes Has patient had a PCN reaction causing severe rash involving mucus membranes or skin necrosis: Yes Has patient had a PCN reaction that required hospitalization: No Has patient had a PCN reaction occurring within  the last 10 years: No If all of the above answers are "NO", then may proceed with Cephalosporin use.     Family History  Problem Relation Age of Onset  . Diabetes Mother    . Diabetes Other   . Cancer Other        breast cancer  . Heart disease Other   . Diabetes Other   . Cancer Maternal Aunt        breast  . Heart disease Maternal Aunt    Prior to Admission medications   Medication Sig Start Date End Date Taking? Authorizing Provider  amLODipine (NORVASC) 5 MG tablet Take 1 tablet (5 mg total) by mouth daily. 04/26/20  Yes Biagio Borg, MD  aspirin 81 MG tablet Take 1 tablet (81 mg total) by mouth daily. 08/21/16  Yes Buriev, Arie Sabina, MD  Cholecalciferol (D3 PO) Take 1 capsule by mouth daily.   Yes [provider]  diazepam (VALIUM) 5 MG tablet 1/2 tab by mouth daily as needed Patient taking differently: Take 2.5 mg by mouth daily. 10/26/19  Yes Biagio Borg, MD  Ferrous Sulfate (IRON) 325 (65 FE) MG TABS Take 325 mg by mouth 2 (two) times daily.   Yes [provider]  furosemide (LASIX) 40 MG tablet Take 1 tablet (40 mg total) by mouth 2 (two) times daily. 04/25/20  Yes Biagio Borg, MD  levothyroxine (SYNTHROID) 50 MCG tablet Take 1 tablet (50 mcg total) by mouth daily. 04/25/20  Yes Biagio Borg, MD  metoprolol succinate (TOPROL-XL) 25 MG 24 hr tablet TAKE 1 tab by mouth once daily Patient taking differently: Take 25 mg by mouth daily. 04/25/20  Yes Biagio Borg, MD  Multiple Vitamins-Minerals (CENTRUM SILVER PO) Take 1 tablet by mouth daily.   Yes [provider]  Multiple Vitamins-Minerals (PRESERVISION AREDS 2 PO) Take 1 tablet by mouth 2 (two) times daily.   Yes [provider]  Omega 3-6-9 CAPS Take 1 capsule by mouth daily.   Yes [provider]  potassium chloride (KLOR-CON) 10 MEQ tablet Take 1 tablet (10 mEq total) by mouth daily. 04/25/20  Yes Biagio Borg, MD  VITAMIN D PO Take 1 tablet by mouth daily.   Yes [provider]  atorvastatin (LIPITOR) 20 MG tablet Take 1 tablet (20 mg total) by mouth daily. Patient not taking: No sig reported 12/16/19   Biagio Borg, MD  glucose blood test  strip Used to test blood sugar 3 times daily 03/01/15   Biagio Borg, MD    Physical Exam:  Physical Exam Constitutional:      General: She is not in acute distress.    Appearance: She is not ill-appearing or diaphoretic.  Eyes:     Conjunctiva/sclera: Conjunctivae normal.     Pupils: Pupils are equal, round, and reactive to light.  Cardiovascular:     Rate and Rhythm: Normal rate. Rhythm irregular.     Heart sounds: Normal heart sounds. No murmur heard.   Pulmonary:     Effort: Pulmonary effort is normal. No respiratory distress.     Breath sounds: Decreased breath sounds present. No wheezing or rales.  Abdominal:     General: Bowel sounds are normal. There is no distension.     Palpations: Abdomen is soft.     Tenderness: There is no abdominal tenderness. There is no guarding or rebound.  Musculoskeletal:        General: No tenderness. Normal range of motion.  Cervical back: Normal range of motion.  Lymphadenopathy:     Cervical: No cervical adenopathy.  Skin:    General: Skin is warm and dry.     Comments: Generalized edema of arms, trunk, abdomen, bilateral lower extremities.  Bilateral feet significantly dry with erythematous lesions on the anterior aspect of lower legs.  Neurological:     General: No focal deficit present.     Mental Status: She is alert and oriented to person, place, and time.     Cranial Nerves: Cranial nerves are intact.     Sensory: Sensation is intact.     Motor: Weakness (Upper extremities with 5 out of 5 strength, lower extremities with 3-5 strength) present. No tremor or abnormal muscle tone.      Labs on Admission: I have personally reviewed following labs and imaging studies  CBC: Recent Labs  Lab 05/20/20 1255  WBC 14.1*  NEUTROABS 10.0*  HGB 11.0*  HCT 35.2*  MCV 97.8  PLT 426    Basic Metabolic Panel: Recent Labs  Lab 05/20/20 1255 05/20/20 1400  NA 142  --   K 4.0  --   CL 108  --   CO2 26  --   GLUCOSE 121*  --    BUN 41*  --   CREATININE 2.66*  --   CALCIUM 7.9*  --   MG  --  1.7    GFR: CrCl cannot be calculated (Unknown ideal weight.).  Liver Function Tests: No results for input(s): AST, ALT, ALKPHOS, BILITOT, PROT, ALBUMIN in the last 168 hours. No results for input(s): LIPASE, AMYLASE in the last 168 hours. No results for input(s): AMMONIA in the last 168 hours.  Coagulation Profile: Recent Labs  Lab 05/20/20 1400  INR 0.9    Cardiac Enzymes: No results for input(s): CKTOTAL, CKMB, CKMBINDEX, TROPONINI in the last 168 hours.  BNP (last 3 results) No results for input(s): PROBNP in the last 8760 hours.  HbA1C: No results for input(s): HGBA1C in the last 72 hours.  CBG: No results for input(s): GLUCAP in the last 168 hours.  Lipid Profile: No results for input(s): CHOL, HDL, LDLCALC, TRIG, CHOLHDL, LDLDIRECT in the last 72 hours.  Thyroid Function Tests: Recent Labs    05/20/20 1356  TSH 5.745*    Anemia Panel: No results for input(s): VITAMINB12, FOLATE, FERRITIN, TIBC, IRON, RETICCTPCT in the last 72 hours.  Urine analysis:    Component Value Date/Time   COLORURINE YELLOW 05/20/2020 [place] 05/20/2020 1645   LABSPEC 1.008 05/20/2020 1645   PHURINE 7.0 05/20/2020 1645   GLUCOSEU 50 (A) 05/20/2020 1645   GLUCOSEU NEGATIVE 10/21/2019 1123   HGBUR MODERATE (A) 05/20/2020 [place] 05/20/2020 1645   KETONESUR NEGATIVE 05/20/2020 1645   PROTEINUR >=300 (A) 05/20/2020 1645   UROBILINOGEN 0.2 10/21/2019 1123   NITRITE NEGATIVE 05/20/2020 1645   LEUKOCYTESUR NEGATIVE 05/20/2020 1645     Radiological Exams on Admission: CT Head Wo Contrast  Result Date: 05/20/2020 CLINICAL DATA:  Neuro deficit, fatigue, lower extremity swelling. EXAM: CT HEAD WITHOUT CONTRAST TECHNIQUE: Contiguous axial images were obtained from the base of the skull through the vertex without intravenous contrast. COMPARISON:  None. FINDINGS: Brain: Mild  chronic small vessel ischemic changes within the deep periventricular white matter regions bilaterally. No mass, hemorrhage, edema or other evidence of acute parenchymal abnormality. No extra-axial hemorrhage. Vascular: Chronic calcified atherosclerotic changes of the large vessels at the skull base. No unexpected hyperdense vessel.  Skull: Normal. Negative for fracture or focal lesion. Sinuses/Orbits: No acute finding. Other: None. IMPRESSION: 1. No acute findings. No intracranial mass, hemorrhage or edema. 2. Mild chronic small vessel ischemic changes in the white matter. Electronically Signed   By: Franki Cabot M.D.   On: 05/20/2020 16:24   DG Chest Port 1 View  Result Date: 05/20/2020 CLINICAL DATA:  Lower extremity edema, increasing fatigue EXAM: PORTABLE CHEST 1 VIEW COMPARISON:  07/30/2017 FINDINGS: Single frontal view of the chest demonstrates an enlarged cardiac silhouette, with marked calcification of the mitral annulus. There is a moderate left pleural effusion, with underlying consolidation at the left lung base. Trace right pleural fluid is noted. Increased central vascular congestion. No pneumothorax. IMPRESSION: 1. Dense consolidation at the left lung base, with moderate left pleural effusion. 2. Trace right pleural fluid. 3. Increased central vascular congestion. 4. Enlarged cardiac silhouette, with dense calcification of the mitral annulus. 5. While the constellation of findings is most consistent with fluid overload, close follow-up is warranted to evaluate for resolution of the dense consolidation at the left lung base. Electronically Signed   By: Randa Ngo M.D.   On: 05/20/2020 15:46    EKG: Independently reviewed.  Irregular rhythm with normal rate.  T wave flattening/inversion lead V2.Marland Kitchen  Assessment/Plan Active Problems:   Diabetes ([place])   Hyperlipidemia   Hypertension   Hypertrophic obstructive cardiomyopathy (HCC)   Depression   Anxiety   Monoclonal B-cell lymphocytosis  of unknown significance   Asymptomatic microscopic hematuria   CKD (chronic kidney disease) stage 4, GFR 15-29 ml/min (HCC)   Acquired hypothyroidism   Macular degeneration of both eyes   Acute CHF ([place])   Acute heart failure New diagnosis. Patient with a history of hypertrophic cardiomyopathy/aortic stenosis. BNP of 1151.9. Anasarca. Possibly precipitated by new onset atrial fibrillation. Currently not in RVR -Lasix 40 mg IV BID; potassium 20 mg BID -Daily BMP -Transthoracic Echocardiogram -No ACEi/ARB secondary to advanced CKD. -Continue Toprol XL -Daily weights/strict in and out  Paroxysmal atrial fibrillation New diagnosis.  Currently in atrial fibrillation with rate controlled. CHA2DS2-VASc Score is 6.  Plan drip started in the ED.  Patient is on Toprol-XL and aspirin as an outpatient. Symptoms of palpitation seem to correlate with recent increase of Synthroid from 25 mcg to 50 mcg -Check TSH/free T4 -Continue Toprol XL -Continue Heparin drip for now; complicated by history of GI bleed of unknown etiology 4 years prior  Pleural effusion Moderate left side consistent with fluid overload picture from acute CHF. Management above  Hypertrophic cardiomyopathy Aortic stenosis Patient was previously following with cardiology as an outpatient. Does not appear   Hypertensive urgency -Continue home amlodipine and metoprolol  AKI on CKD stage IV Baseline creatine is about 2.11 from 04/22/20. Previously, baseline of 1.0-1.1 from 2020. Likely acute injury from heart failure. -Lasix as above -BMP in AM  Anasarca In setting of heart failure. Management above.  Diabetes mellitus, type II Last hemoglobin A1C of 6.3%. -SSI while inpatient  Hypothyroidism Patient previously on Synthroid 25 mcg which was increased to Synthroid 50 mcg on 04/25/2020 for TSH of 11.5. -Decrease to Synthroid 37.5 mcg daily pending TSH result  Anxiety -Continue Valium  Monoclonal B-cell lymphocytosis of  unknown significance Patient previously was followed by oncology.  Currently not on any management.  History of GI bleeding At that time in 2018 but no definitive etiology found.  Patient with a history of diverticulosis, hemorrhoids, gastritis.  Iron deficiency anemia -Continue iron supplementation  DVT prophylaxis: Heparin IV Code Status: DNR Family Communication: None at bedside Disposition Plan: Discharge back to ILF vs SNF pending PT/OT recommendations Consults called: None Admission status: Inpatient   Cordelia Poche, MD [facility] 05/20/2020, 6:03 PM

## 2020-05-20 NOTE — Patient Instructions (Signed)
We will get you to [facility]

## 2020-05-20 NOTE — Telephone Encounter (Signed)
Patient requesting to switch to Dr. Sharlet Salina for her PCP, is this okay with you?  It is okay with Dr. Sharlet Salina

## 2020-05-20 NOTE — ED Notes (Signed)
X-ray at bedside

## 2020-05-20 NOTE — ED Notes (Signed)
Assisted pt to and from bedside commode.

## 2020-05-20 NOTE — Telephone Encounter (Signed)
Ok with me 

## 2020-05-20 NOTE — Progress Notes (Signed)
ANTICOAGULATION CONSULT NOTE - Initial Consult  Pharmacy Consult for IV heparin Indication: atrial fibrillation  Allergies  Allergen Reactions  . Codeine Other (See Comments)    Passed out  . Oxycodone Other (See Comments)    Visual Hallucinations  . Roxanol [Morphine] Hives and Rash  . Penicillins Rash    Has patient had a PCN reaction causing immediate rash, facial/tongue/throat swelling, SOB or lightheadedness with hypotension: Yes Has patient had a PCN reaction causing severe rash involving mucus membranes or skin necrosis: Yes Has patient had a PCN reaction that required hospitalization: No Has patient had a PCN reaction occurring within the last 10 years: No If all of the above answers are "NO", then may proceed with Cephalosporin use.     Patient Measurements:   Heparin Dosing Weight: 68.7 kg  Vital Signs: Temp: 98.2 F (36.8 C) (04/29 1058) Temp Source: Oral (04/29 1058) BP: 201/92 (04/29 1400) Pulse Rate: 82 (04/29 1400)  Labs: Recent Labs    05/20/20 1255  HGB 11.0*  HCT 35.2*  PLT 305  CREATININE 2.66*    CrCl cannot be calculated (Unknown ideal weight.).   Medical History: Past Medical History:  Diagnosis Date  . Allergy   . Anemia   . Aortic stenosis   . Aortic stenosis, moderate 06/16/2011  . Blood transfusion without reported diagnosis 2010?  Marland Kitchen Cardiomyopathy ([place])   . CLL (chronic lymphoblastic leukemia) 12/01/2010  . Diabetes mellitus   . Dry skin dermatitis 12/01/2010  . Hyperlipidemia   . Hypertension   . Iron deficiency 12/01/2010  . Monoclonal B-cell lymphocytosis of unknown significance 12/02/2014  . Osteopenia   . Skin cancer   . Stroke [facility])    1999    Medications:  Scheduled:  . furosemide  40 mg Intravenous Once   Infusions:    Assessment: 87 yo female with Afib to start IV heparin per pharmacy dosing. Per office visit note, patient with recent fall so will have to follow up on long term goals of anticoagulation if  appropriate. Baseline labs have been obtained. Question of whether cardioversion will be done during this admission, awaiting further Md notes for clarification  Goal of Therapy:  Heparin level 0.3-0.7 units/ml Monitor platelets by anticoagulation protocol: Yes   Plan:   IV heparin 2000 unit bolus then  IV heparin rate of 750 units/hr  Check heparin level 8 hours after start of IV heparin  Daily CBC and heparin level   Adrian Saran, PharmD, BCPS Secure Chat if ?s 05/20/2020 3:08 PM

## 2020-05-20 NOTE — Assessment & Plan Note (Signed)
This has not been addressed in a long time but WBC increasing over the last year may be active again. Last onc follow up 2016.

## 2020-05-20 NOTE — ED Triage Notes (Addendum)
Pt arrived via POV, states she was at PCP this morning, swelling in bilateral feet. Concerned she was retaining fluid. Also c/o increased fatigue. All sx since feb. Hx of htn, unsure if she took her BP meds recently. Multiple recent falls. States she fell yesterday getting out of shower, no head strike. Down for about 3 hrs until she was able to get up. Lives alone.

## 2020-05-20 NOTE — ED Notes (Signed)
Pure wick in place will give urine sample when able. 

## 2020-05-20 NOTE — Assessment & Plan Note (Addendum)
Appears to be in severe flare with some rales in the lungs, severe SOB, not taking lasix appropriately (rx BID and taking daily) with new renal failure may be due to volume overload and likely needs diuresis. Have recommended they go to ER and likely will need admission.

## 2020-05-21 ENCOUNTER — Inpatient Hospital Stay (HOSPITAL_COMMUNITY): Payer: Medicare HMO

## 2020-05-21 DIAGNOSIS — I5031 Acute diastolic (congestive) heart failure: Secondary | ICD-10-CM | POA: Diagnosis not present

## 2020-05-21 DIAGNOSIS — F419 Anxiety disorder, unspecified: Secondary | ICD-10-CM | POA: Diagnosis not present

## 2020-05-21 DIAGNOSIS — E1165 Type 2 diabetes mellitus with hyperglycemia: Secondary | ICD-10-CM

## 2020-05-21 DIAGNOSIS — R3121 Asymptomatic microscopic hematuria: Secondary | ICD-10-CM | POA: Diagnosis not present

## 2020-05-21 DIAGNOSIS — I5023 Acute on chronic systolic (congestive) heart failure: Secondary | ICD-10-CM | POA: Diagnosis not present

## 2020-05-21 DIAGNOSIS — N184 Chronic kidney disease, stage 4 (severe): Secondary | ICD-10-CM | POA: Diagnosis not present

## 2020-05-21 DIAGNOSIS — H3530 Unspecified macular degeneration: Secondary | ICD-10-CM

## 2020-05-21 LAB — CBC
HCT: 33.7 % — ABNORMAL LOW (ref 36.0–46.0)
Hemoglobin: 10.4 g/dL — ABNORMAL LOW (ref 12.0–15.0)
MCH: 30.6 pg (ref 26.0–34.0)
MCHC: 30.9 g/dL (ref 30.0–36.0)
MCV: 99.1 fL (ref 80.0–100.0)
Platelets: 280 10*3/uL (ref 150–400)
RBC: 3.4 MIL/uL — ABNORMAL LOW (ref 3.87–5.11)
RDW: 13.5 % (ref 11.5–15.5)
WBC: 12.2 10*3/uL — ABNORMAL HIGH (ref 4.0–10.5)
nRBC: 0 % (ref 0.0–0.2)

## 2020-05-21 LAB — BASIC METABOLIC PANEL
Anion gap: 9 (ref 5–15)
BUN: 40 mg/dL — ABNORMAL HIGH (ref 8–23)
CO2: 26 mmol/L (ref 22–32)
Calcium: 7.8 mg/dL — ABNORMAL LOW (ref 8.9–10.3)
Chloride: 107 mmol/L (ref 98–111)
Creatinine, Ser: 2.78 mg/dL — ABNORMAL HIGH (ref 0.44–1.00)
GFR, Estimated: 16 mL/min — ABNORMAL LOW (ref >60)
Glucose, Bld: 131 mg/dL — ABNORMAL HIGH (ref 70–99)
Potassium: 4.3 mmol/L (ref 3.5–5.1)
Sodium: 142 mmol/L (ref 135–145)

## 2020-05-21 LAB — ECHOCARDIOGRAM COMPLETE
AR max vel: 1.12 cm2
AV Area VTI: 0.76 cm2
AV Area mean vel: 1.11 cm2
AV Mean grad: 28 mmHg
AV Peak grad: 58.1 mmHg
Ao pk vel: 3.81 m/s
Area-P 1/2: 2.24 cm2
Height: 61 in
S' Lateral: 2.5 cm

## 2020-05-21 LAB — HEPARIN LEVEL (UNFRACTIONATED)
Heparin Unfractionated: 0.16 IU/mL — ABNORMAL LOW (ref 0.30–0.70)
Heparin Unfractionated: 0.34 IU/mL (ref 0.30–0.70)
Heparin Unfractionated: 0.7 IU/mL (ref 0.30–0.70)

## 2020-05-21 LAB — T4, FREE: Free T4: 1.04 ng/dL (ref 0.61–1.12)

## 2020-05-21 LAB — GLUCOSE, CAPILLARY
Glucose-Capillary: 103 mg/dL — ABNORMAL HIGH (ref 70–99)
Glucose-Capillary: 122 mg/dL — ABNORMAL HIGH (ref 70–99)
Glucose-Capillary: 188 mg/dL — ABNORMAL HIGH (ref 70–99)
Glucose-Capillary: 131 mg/dL — ABNORMAL HIGH (ref 70–99)

## 2020-05-21 LAB — SARS CORONAVIRUS 2 (TAT 6-24 HRS): SARS Coronavirus 2: NEGATIVE

## 2020-05-21 MED ORDER — HEPARIN BOLUS VIA INFUSION
2000.0000 [IU] | Freq: Once | INTRAVENOUS | Status: AC
  Administered 2020-05-21: 2000 [IU] via INTRAVENOUS
  Filled 2020-05-21: qty 2000

## 2020-05-21 MED ORDER — HYDROCERIN EX CREA
TOPICAL_CREAM | Freq: Two times a day (BID) | CUTANEOUS | Status: DC
  Filled 2020-05-21: qty 113

## 2020-05-21 MED ORDER — HEPARIN (PORCINE) 25000 UT/250ML-% IV SOLN
900.0000 [IU]/h | INTRAVENOUS | Status: DC
  Administered 2020-05-21 (×2): 900 [IU]/h via INTRAVENOUS
  Filled 2020-05-21 (×2): qty 250

## 2020-05-21 NOTE — NC FL2 (Addendum)
[place] LEVEL OF CARE SCREENING TOOL     IDENTIFICATION  Patient Name: Norma Smith Birthdate: October 12, 1933 Sex: female Admission Date (Current Location): 05/20/2020  [facility] and [place] Number:  Herbalist and Address:  [facility],  [address], [place]      Provider Number: [id number]  Attending Physician Name and Address:  Mariel Aloe, MD  Relative Name and Phone Number:  Glean Salvo (daughter) Ph: [phone]    Current Level of Care: Hospital Recommended Level of Care: [place] Prior Approval Number:    Date Approved/Denied:   PASRR Number:   [id number]  Discharge Plan: SNF    Current Diagnoses: Patient Active Problem List   Diagnosis Date Noted  . Acute CHF ([place]) 05/20/2020  . Skin lesion 04/25/2020  . Breast mass in female 04/25/2020  . Macular degeneration of both eyes 04/25/2020  . Vitamin D deficiency 10/26/2019  . Shingles 08/11/2016  . Gastritis 08/08/2016  . GI bleeding 08/01/2016  . Symptomatic anemia   . Acquired hypothyroidism 03/13/2016  . DNR (do not resuscitate) discussion 03/13/2016  . CKD (chronic kidney disease) stage 4, GFR 15-29 ml/min (HCC) 08/25/2015  . Monoclonal B-cell lymphocytosis of unknown significance 12/02/2014  . Asymptomatic microscopic hematuria 12/02/2014  . Anemia of chronic kidney failure 12/02/2014  . Peripheral edema 02/03/2013  . Post-menopausal bleeding 02/03/2013  . Esophageal reflux 02/03/2013  . Constipation 02/03/2013  . Anxiety 01/04/2013  . AKI (acute kidney injury) ([place]) 12/15/2012  . Acute fulminating appendicitis with perforation and peritonitis 12/14/2012  . Depression 06/11/2012  . Daytime hypersomnolence 06/11/2012  . Hypertrophic obstructive cardiomyopathy ([place]) 06/20/2011  . Bruit 06/20/2011  . Claudication ([place]) 06/20/2011  . Aortic stenosis, moderate 06/16/2011  . Vertigo 12/02/2010  . Iron deficiency 12/01/2010  . Dry skin  dermatitis 12/01/2010  . CLL (chronic lymphocytic leukemia) ([place]) 12/01/2010  . Fatigue 12/01/2010  . Allergy   . Colon polyps   . Diabetes ([place])   . Hyperlipidemia   . Hypertension   . Anemia   . Osteopenia   . Encounter for well adult exam with abnormal findings 11/24/2010    Orientation RESPIRATION BLADDER Height & Weight     Self,Time,Situation,Place  O2 (2L/min) Continent Weight: 151 lb 3.8 oz (68.6 kg) Height:  5\' 1"  (154.9 cm)  BEHAVIORAL SYMPTOMS/MOOD NEUROLOGICAL BOWEL NUTRITION STATUS      Continent Diet (Heart healthy/carb modified)  AMBULATORY STATUS COMMUNICATION OF NEEDS Skin   Limited Assist Verbally Other (Comment) (Ecchymosis: right arm; Cracking: bilateral legs)                       Personal Care Assistance Level of Assistance  Bathing,Feeding,Dressing Bathing Assistance: Limited assistance Feeding assistance: Independent Dressing Assistance: Limited assistance     Functional Limitations Info  Sight,Hearing,Speech Sight Info: Impaired Hearing Info: Impaired Speech Info: Adequate    SPECIAL CARE FACTORS FREQUENCY  PT (By licensed PT),OT (By licensed OT)     PT Frequency: 5x's/week OT Frequency: 5x's/week            Contractures Contractures Info: Not present    Additional Factors Info  Code Status,Allergies,Psychotropic,Insulin Sliding Scale Code Status Info: DNR Allergies Info: Codeine, Oxycodone, Roxanol (Morphine), Penicillins Psychotropic Info: Valium Insulin Sliding Scale Info: See discharge summary       Current Medications (05/21/2020):  This is the current hospital active medication list Current Facility-Administered Medications  Medication Dose Route Frequency Provider Last Rate Last  Admin  . 0.9 %  sodium chloride infusion  250 mL Intravenous PRN Mariel Aloe, MD      . acetaminophen (TYLENOL) tablet 650 mg  650 mg Oral Q4H PRN Mariel Aloe, MD   650 mg at 05/21/20 6333  . amLODipine (NORVASC) tablet 5 mg  5 mg Oral  Daily Mariel Aloe, MD   5 mg at 05/21/20 0813  . aspirin EC tablet 81 mg  81 mg Oral Daily Mariel Aloe, MD   81 mg at 05/21/20 5456  . diazepam (VALIUM) tablet 2.5 mg  2.5 mg Oral Daily Mariel Aloe, MD   2.5 mg at 05/21/20 0813  . ferrous sulfate tablet 325 mg  325 mg Oral BID Mariel Aloe, MD   325 mg at 05/21/20 0813  . furosemide (LASIX) injection 40 mg  40 mg Intravenous BID Mariel Aloe, MD   40 mg at 05/21/20 2563  . heparin ADULT infusion 100 units/mL (25000 units/226mL)  900 Units/hr Intravenous Continuous Lenis Noon, RPH 9 mL/hr at 05/21/20 1029 900 Units/hr at 05/21/20 1029  . hydrocerin (EUCERIN) cream   Topical BID Mariel Aloe, MD      . insulin aspart (novoLOG) injection 0-9 Units  0-9 Units Subcutaneous TID WC Mariel Aloe, MD   1 Units at 05/21/20 1341  . levothyroxine (SYNTHROID) tablet 37.5 mcg  37.5 mcg Oral Q0600 Mariel Aloe, MD   37.5 mcg at 05/21/20 0543  . metoprolol succinate (TOPROL-XL) 24 hr tablet 25 mg  25 mg Oral Daily Mariel Aloe, MD   25 mg at 05/21/20 0813  . ondansetron (ZOFRAN) injection 4 mg  4 mg Intravenous Q6H PRN Mariel Aloe, MD      . potassium chloride SA (KLOR-CON) CR tablet 20 mEq  20 mEq Oral BID Mariel Aloe, MD   20 mEq at 05/21/20 0813  . sodium chloride flush (NS) 0.9 % injection 3 mL  3 mL Intravenous Q12H Mariel Aloe, MD   3 mL at 05/21/20 0814  . sodium chloride flush (NS) 0.9 % injection 3 mL  3 mL Intravenous PRN Mariel Aloe, MD         Discharge Medications: Please see discharge summary for a list of discharge medications.  Relevant Imaging Results:  Relevant Lab Results:   Additional Information SSN: [id number]  Sherie Don, LCSW

## 2020-05-21 NOTE — Progress Notes (Signed)
   05/21/20 1352  Assess: MEWS Score  Temp 97.8 F (36.6 C)  BP (!) 152/48  Pulse Rate 71  SpO2 96 %  O2 Device Nasal Cannula  O2 Flow Rate (L/min) 2 L/min  Assess: MEWS Score  MEWS Temp 0  MEWS Systolic 0  MEWS Pulse 0  MEWS RR 2  MEWS LOC 0  MEWS Score 2  MEWS Score Color Yellow  Assess: if the MEWS score is Yellow or Red  Were vital signs taken at a resting state? Yes  Focused Assessment No change from prior assessment  Early Detection of Sepsis Score *See Row Information* Low  MEWS guidelines implemented *See Row Information* Yes  Treat  Pain Scale 0-10  Pain Score 0  Take Vital Signs  Increase Vital Sign Frequency  Yellow: Q 2hr X 2 then Q 4hr X 2, if remains yellow, continue Q 4hrs  Escalate  MEWS: Escalate Yellow: discuss with charge nurse/RN and consider discussing with provider and RRT  Notify: Charge Nurse/RN  Name of Charge Nurse/RN Notified Tara, RN  Date Charge Nurse/RN Notified 05/21/20  Time Charge Nurse/RN Notified 1353  Charge nurse notified. Will continue to monitor

## 2020-05-21 NOTE — Evaluation (Signed)
Physical Therapy Evaluation Patient Details Name: Norma Smith MRN: [id number] DOB: [birth year] Today's Date: 05/21/2020   History of Present Illness  Norma Smith is a 87 y.o. female with medical history significant of iron deficiency anemia, aortic stenosis, hypertrophic cardiomyopathy, anxiety, CKD stage IV, diabetes mellitus, hyperlipidemia, hypertension, macular degeneration, hypothyroidism. Patient presents with worsening dyspnea over the past few weeks and found to be in A-fib. Pt also suffered a fall when exiting her shower on 05/19/2020.  Clinical Impression  Patient  Lives alone in apartment, ?senior living facility in apartment. Patient is independnet PTA.  Patient has low vision due to AMD.  Patient ambulated x 30' x 2, patient reports feeling weak. Patient sat  Onto rollator  Then ambulated back to room. Patient required min guard assistance. At baseline, patient does not use AD. SPO2 on 2 L 100%, 91 HR Pt admitted with above diagnosis. Pt currently with functional limitations due to the deficits listed below (see PT Problem List). Pt will benefit from skilled PT to increase their independence and safety with mobility to allow discharge to the venue listed below.      Follow Up Recommendations SNF unless gets some assistance     Equipment Recommendations  None recommended by PT    Recommendations for Other Services       Precautions / Restrictions Precautions Precautions: Fall Precaution Comments: monitor sats/HR Restrictions Weight Bearing Restrictions: No      Mobility  Bed Mobility               General bed mobility comments: Pt up in recliner at start of visit.    Transfers Overall transfer level: Needs assistance Equipment used: 4-wheeled walker Transfers: Sit to/from Stand Sit to Stand: Supervision Stand pivot transfers: Min guard       General transfer comment: stands fro recliner and rollator with min guard. Able to turn and sit on rollator   then stand  up and turn back to hold rollator to amb  Ambulation/Gait Ambulation/Gait assistance: Min guard;Min assist Gait Distance (Feet): 30 Feet (x 2) Assistive device: 4-wheeled walker Gait Pattern/deviations: Step-to pattern;Step-through pattern Gait velocity: decr   General Gait Details: patient with noted dyspnea3/4. asked to sit down. patient turned to down on rollator.  Stairs            Wheelchair Mobility    Modified Rankin (Stroke Patients Only)       Balance Overall balance assessment: Needs assistance Sitting-balance support: Feet supported Sitting balance-Leahy Scale: Good     Standing balance support: Bilateral upper extremity supported Standing balance-Leahy Scale: Poor Standing balance comment: Fair static and poor dynamic standing with need of BUE support on Rollator.                             Pertinent Vitals/Pain Pain Assessment: 0-10 Pain Score: 8  Pain Location: HA= 5 and back= 8-9.  Pt reports back pain is chronic. Pain Descriptors / Indicators: Aching;Discomfort Pain Intervention(s): Monitored during session;Premedicated before session;Limited activity within patient's tolerance    Home Living Family/patient expects to be discharged to::  (Indep living/senior high rise) Living Arrangements: Alone   Type of Home: Apartment Home Access: Elevator     Home Layout: One level Home Equipment: Shower seat - built in;Walker - 4 wheels;Cane - single point Additional Comments: limited  driving with plan to stop completely and use van transportation provided by apartments.    Prior Function Level of Independence:  Independent         Comments: per pt does not use rollator, uses cane sometimes, daughter available limited, dtr  can assist with meds,     Hand Dominance   Dominant Hand: Left    Extremity/Trunk Assessment   Upper Extremity Assessment Upper Extremity Assessment: Generalized weakness    Lower Extremity  Assessment Lower Extremity Assessment: Generalized weakness    Cervical / Trunk Assessment Cervical / Trunk Assessment: Normal  Communication   Communication: No difficulties  Cognition Arousal/Alertness: Awake/alert Behavior During Therapy: WFL for tasks assessed/performed Overall Cognitive Status: Within Functional Limits for tasks assessed                                        General Comments      Exercises     Assessment/Plan    PT Assessment Patient needs continued PT services  PT Problem List Decreased strength;Decreased mobility;Decreased safety awareness;Decreased knowledge of precautions;Decreased activity tolerance;Decreased balance;Cardiopulmonary status limiting activity       PT Treatment Interventions DME instruction;Therapeutic activities;Gait training;Therapeutic exercise;Patient/family education;Functional mobility training    PT Goals (Current goals can be found in the Care Plan section)  Acute Rehab PT Goals Patient Stated Goal: Get strength back. PT Goal Formulation: With patient Time For Goal Achievement: 06/04/20 Potential to Achieve Goals: Good    Frequency Min 3X/week   Barriers to discharge Decreased caregiver support      Co-evaluation PT/OT/SLP Co-Evaluation/Treatment: Yes Reason for Co-Treatment: For patient/therapist safety PT goals addressed during session: Mobility/safety with mobility OT goals addressed during session: ADL's and self-care       AM-PAC PT "6 Clicks" Mobility  Outcome Measure Help needed turning from your back to your side while in a flat bed without using bedrails?: A Little Help needed moving from lying on your back to sitting on the side of a flat bed without using bedrails?: A Little Help needed moving to and from a bed to a chair (including a wheelchair)?: A Little Help needed standing up from a chair using your arms (e.g., wheelchair or bedside chair)?: A Little Help needed to walk in  hospital room?: A Little Help needed climbing 3-5 steps with a railing? : A Lot 6 Click Score: 17    End of Session Equipment Utilized During Treatment: Gait belt;Oxygen Activity Tolerance: Patient limited by fatigue Patient left: in chair;with call bell/phone within reach;with chair alarm set Nurse Communication: Mobility status PT Visit Diagnosis: Unsteadiness on feet (R26.81);Difficulty in walking, not elsewhere classified (R26.2)    Time: 7262-0355 PT Time Calculation (min) (ACUTE ONLY): 22 min   Charges:   PT Evaluation $PT Eval Low Complexity: [place] [facility] Acute Rehabilitation Services Pager [phone] Office [phone]   Claretha Cooper 05/21/2020, 1:05 PM

## 2020-05-21 NOTE — Progress Notes (Signed)
PROGRESS NOTE    Norma Smith  QZE:[id number] DOB: [birth year] DOA: 05/20/2020 PCP: Hoyt Koch, MD   Brief Narrative: Norma Smith is a 87 y.o. female with medical history significant of iron deficiency anemia, aortic stenosis, hypertrophic cardiomyopathy, anxiety, CKD stage IV, diabetes mellitus, hyperlipidemia, hypertension, macular degeneration, hypothyroidism. Patient presented secondary to shortness of breath and found to have evidence of new heart failure. IV lasix started.   Assessment & Plan:   Active Problems:   Diabetes ([place])   Hyperlipidemia   Hypertension   Hypertrophic obstructive cardiomyopathy (H[facility])   Depression   Anxiety   Monoclonal B-cell lymphocytosis of unknown significance   Asymptomatic microscopic hematuria   CKD (chronic kidney disease) stage 4, GFR 15-29 ml/min (HCC)   Acquired hypothyroidism   Macular degeneration of both eyes   Acute CHF ([place])   Acute diastolic heart failure New diagnosis. Patient with a history of hypertrophic cardiomyopathy/aortic stenosis. BNP of 1151.9. Anasarca. Possibly precipitated by new onset atrial fibrillation. Currently not in RVR -Lasix 40 mg IV BID; potassium 20 mg BID -Daily BMP -No ACEi/ARB secondary to advanced CKD. -Continue Toprol XL -Daily weights/strict in and out -Cardiology consult  Paroxysmal atrial fibrillation New diagnosis.  Currently in atrial fibrillation with rate controlled. CHA2DS2-VASc Scoreis 6.  Plan drip started in the ED.  Patient is on Toprol-XL and aspirin as an outpatient. Symptoms of palpitation seem to correlate with recent increase of Synthroid from 25 mcg to 50 mcg. Transthoracic Echocardiogram significant for left atrial enlargement. -Continue Toprol XL -Continue Heparin drip for now; complicated by history of GI bleed of unknown etiology 4 years prior  Pleural effusion Moderate left side consistent with fluid overload picture from acute CHF. Management  above  Hypertrophic cardiomyopathy Aortic stenosis Patient was previously following with cardiology as an outpatient. Moderate stenosis on most recent Transthoracic Echocardiogram.  Hypertensive urgency Improved with home medications. -Continue home amlodipine and metoprolol  AKI on CKD stage IV Baseline creatine is about 2.11 from 04/22/20. Previously, baseline of 1.0-1.1 from 2020. Likely acute injury from heart failure. Slightly up today. -Lasix as above -Daily BMP  Anasarca In setting of heart failure. Management above.  Diabetes mellitus, type II Last hemoglobin A1C of 6.3%. -SSI while inpatient  Hypothyroidism Patient previously on Synthroid 25 mcg which was increased to Synthroid 50 mcg on 04/25/2020 for TSH of 11.5. TSH on admission of 5.745 -Continue Synthroid 37.5 mcg daily  Anxiety -Continue Valium  Monoclonal B-cell lymphocytosis of unknown significance Patient previously was followed by oncology.  Currently not on any management.  History of GI bleeding At that time in 2018 but no definitive etiology found.  Patient with a history of diverticulosis, hemorrhoids, gastritis.  Iron deficiency anemia Continue iron supplementation   DVT prophylaxis: Heparin IV Code Status:   Code Status: DNR Family Communication: None at bedside Disposition Plan: Discharge to ILF vs SNF pending functional status improvement, cardiology recommendations.   Consultants:   Cardiology  Procedures:   TRANSTHORACIC ECHOCARDIOGRAM (05/21/2020) IMPRESSIONS    1. Left ventricular ejection fraction, by estimation, is 60 to 65%. The  left ventricle has normal function. The left ventricle has no regional  wall motion abnormalities. There is moderate left ventricular hypertrophy.  Left ventricular diastolic  parameters are consistent with Grade II diastolic dysfunction  (pseudonormalization).  2. Right ventricular systolic function is normal. The right ventricular  size  is normal.  3. Left atrial size was severely dilated.  4. The trivial pericardial effusion is posterior to  the left ventricle.  Moderate pleural effusion in the left lateral region.  5. The mitral valve is abnormal. Trivial mitral valve regurgitation.  Moderate to severe mitral annular calcification.  6. The aortic valve is trileaflet, however, the non-coronary cusp is  calcified and restricted. Aortic valve regurgitation is trivial. Moderate  aortic valve stenosis. Aortic valve area, by VTI measures 0.76 cm. Aortic  valve mean gradient measures 28.0  mmHg. Aortic valve Vmax measures 3.81 m/s.   Comparison(s): Prior images unable to be directly viewed, comparison made  by report only. Changes from prior study are noted. 08/02/2016: LVEF  65-70%, grade 2 DD, mild AS -13 mmHg mean gradient.   Conclusion(s)/Recommendation(s): Consider further aortic valve evaluation  with TEE.   Antimicrobials:  None    Subjective: Dyspnea with exertion. Had an episode of dyspnea overnight requiring initiation of oxygen. Associated hypoxia with spO2 of 89%  Objective: Vitals:   05/21/20 0101 05/21/20 0513 05/21/20 0820 05/21/20 1247  BP: (!) 178/90 (!) 160/78 (!) 147/87 (!) 164/63  Pulse: 83 61 88 75  Resp: 20 18 20  (!) 26  Temp:  98 F (36.7 C) (!) 97.5 F (36.4 C) 98.3 F (36.8 C)  TempSrc:  Oral Oral Oral  SpO2: 93% 94% 98% 95%    Intake/Output Summary (Last 24 hours) at 05/21/2020 1300 Last data filed at 05/21/2020 0830 Gross per 24 hour  Intake 122.38 ml  Output 100 ml  Net 22.38 ml   There were no vitals filed for this visit.  Examination:  General exam: Appears calm and comfortable and in no acute distress. Conversant Respiratory: Diminished to auscultation. Respiratory effort normal with no intercostal retractions or use of accessory muscles Cardiovascular: S1 & S2 heard, RRR. Anasarca Gastrointestinal: Abdomen is distended, soft and nontender. No masses felt. normal  bowel sounds heard Neurologic: No focal neurological deficits Musculoskeletal: No calf tenderness Skin: No cyanosis. Dry skin dermatitis. Psychiatry: Alert and oriented. Memory intact. Mood & affect appropriate    Data Reviewed: I have personally reviewed following labs and imaging studies  CBC Lab Results  Component Value Date   WBC 12.2 (H) 05/21/2020   RBC 3.40 (L) 05/21/2020   HGB 10.4 (L) 05/21/2020   HCT 33.7 (L) 05/21/2020   MCV 99.1 05/21/2020   MCH 30.6 05/21/2020   PLT 280 05/21/2020   MCHC 30.9 05/21/2020   RDW 13.5 05/21/2020   LYMPHSABS 2.9 05/20/2020   MONOABS 1.0 05/20/2020   EOSABS 0.1 05/20/2020   BASOSABS 0.1 17/49/4496     Last metabolic panel Lab Results  Component Value Date   NA 142 05/21/2020   K 4.3 05/21/2020   CL 107 05/21/2020   CO2 26 05/21/2020   BUN 40 (H) 05/21/2020   CREATININE 2.78 (H) 05/21/2020   GLUCOSE 131 (H) 05/21/2020   GFRNONAA 16 (L) 05/21/2020   GFRAA 52 (L) 08/04/2016   CALCIUM 7.8 (L) 05/21/2020   PROT 5.9 (L) 04/22/2020   ALBUMIN 2.8 (L) 04/22/2020   BILITOT 0.4 04/22/2020   ALKPHOS 64 04/22/2020   AST 22 04/22/2020   ALT 14 04/22/2020   ANIONGAP 9 05/21/2020    CBG (last 3)  Recent Labs    05/20/20 2105 05/21/20 0811 05/21/20 1224  GLUCAP 115* 103* 122*     GFR: CrCl cannot be calculated (Unknown ideal weight.).  Coagulation Profile: Recent Labs  Lab 05/20/20 1400  INR 0.9    Recent Results (from the past 240 hour(s))  SARS CORONAVIRUS 2 (TAT 6-24 HRS) Nasopharyngeal  Nasopharyngeal Swab     Status: None   Collection Time: 05/20/20  3:40 PM   Specimen: Nasopharyngeal Swab  Result Value Ref Range Status   SARS Coronavirus 2 NEGATIVE NEGATIVE Final    Comment: (NOTE) SARS-CoV-2 target nucleic acids are NOT DETECTED.  The SARS-CoV-2 RNA is generally detectable in upper and lower respiratory specimens during the acute phase of infection. Negative results do not preclude SARS-CoV-2 infection, do  not rule out co-infections with other pathogens, and should not be used as the sole basis for treatment or other patient management decisions. Negative results must be combined with clinical observations, patient history, and epidemiological information. The expected result is Negative.  Fact Sheet for Patients: SugarRoll.be  Fact Sheet for Healthcare Providers: https://www.woods-mathews.com/  This test is not yet approved or cleared by the [place] FDA and  has been authorized for detection and/or diagnosis of SARS-CoV-2 by FDA under an Emergency Use Authorization (EUA). This EUA will remain  in effect (meaning this test can be used) for the duration of the COVID-19 declaration under Se ction 564(b)(1) of the Act, 21 U.S.C. section 360bbb-3(b)(1), unless the authorization is terminated or revoked sooner.  Performed at [facility], [place] [address] [place], [place] [address]         Radiology Studies: CT Head Wo Contrast  Result Date: 05/20/2020 CLINICAL DATA:  Neuro deficit, fatigue, lower extremity swelling. EXAM: CT HEAD WITHOUT CONTRAST TECHNIQUE: Contiguous axial images were obtained from the base of the skull through the vertex without intravenous contrast. COMPARISON:  None. FINDINGS: Brain: Mild chronic small vessel ischemic changes within the deep periventricular white matter regions bilaterally. No mass, hemorrhage, edema or other evidence of acute parenchymal abnormality. No extra-axial hemorrhage. Vascular: Chronic calcified atherosclerotic changes of the large vessels at the skull base. No unexpected hyperdense vessel. Skull: Normal. Negative for fracture or focal lesion. Sinuses/Orbits: No acute finding. Other: None. IMPRESSION: 1. No acute findings. No intracranial mass, hemorrhage or edema. 2. Mild chronic small vessel ischemic changes in the white matter. Electronically Signed   By: Franki Cabot M.D.   On: 05/20/2020  16:24   DG Chest Port 1 View  Result Date: 05/20/2020 CLINICAL DATA:  Lower extremity edema, increasing fatigue EXAM: PORTABLE CHEST 1 VIEW COMPARISON:  07/30/2017 FINDINGS: Single frontal view of the chest demonstrates an enlarged cardiac silhouette, with marked calcification of the mitral annulus. There is a moderate left pleural effusion, with underlying consolidation at the left lung base. Trace right pleural fluid is noted. Increased central vascular congestion. No pneumothorax. IMPRESSION: 1. Dense consolidation at the left lung base, with moderate left pleural effusion. 2. Trace right pleural fluid. 3. Increased central vascular congestion. 4. Enlarged cardiac silhouette, with dense calcification of the mitral annulus. 5. While the constellation of findings is most consistent with fluid overload, close follow-up is warranted to evaluate for resolution of the dense consolidation at the left lung base. Electronically Signed   By: Randa Ngo M.D.   On: 05/20/2020 15:46   ECHOCARDIOGRAM COMPLETE  Result Date: 05/21/2020    ECHOCARDIOGRAM REPORT   Patient Name:   Norma Smith Date of Exam: 05/21/2020 Medical Rec #:  [id number]     Height:       61.0 in Accession #:    [id number]    Weight:       151.4 lb Date of Birth:  1933/07/02      BSA:          1.678 m  Patient Age:    34 years      BP:           147/87 mmHg Patient Gender: F             HR:           74 bpm. Exam Location:  Inpatient Procedure: 2D Echo, Cardiac Doppler and Color Doppler Indications:    I50.23 Acute on chronic systolic (congestive) heart failure  History:        Patient has prior history of Echocardiogram examinations, most                 recent 08/02/2016. Cardiomyopathy, Stroke and COPD, Aortic Valve                 Disease; Risk Factors:Hypertension, Diabetes and Dyslipidemia.                 [place].  Sonographer:    Jonelle Sidle Dance Referring Phys: [place]  1. Left ventricular ejection fraction, by  estimation, is 60 to 65%. The left ventricle has normal function. The left ventricle has no regional wall motion abnormalities. There is moderate left ventricular hypertrophy. Left ventricular diastolic parameters are consistent with Grade II diastolic dysfunction (pseudonormalization).  2. Right ventricular systolic function is normal. The right ventricular size is normal.  3. Left atrial size was severely dilated.  4. The trivial pericardial effusion is posterior to the left ventricle. Moderate pleural effusion in the left lateral region.  5. The mitral valve is abnormal. Trivial mitral valve regurgitation. Moderate to severe mitral annular calcification.  6. The aortic valve is trileaflet, however, the non-coronary cusp is calcified and restricted. Aortic valve regurgitation is trivial. Moderate aortic valve stenosis. Aortic valve area, by VTI measures 0.76 cm. Aortic valve mean gradient measures 28.0 mmHg. Aortic valve Vmax measures 3.81 m/s. Comparison(s): Prior images unable to be directly viewed, comparison made by report only. Changes from prior study are noted. 08/02/2016: LVEF 65-70%, grade 2 DD, mild AS -13 mmHg mean gradient. Conclusion(s)/Recommendation(s): Consider further aortic valve evaluation with TEE. FINDINGS  Left Ventricle: Left ventricular ejection fraction, by estimation, is 60 to 65%. The left ventricle has normal function. The left ventricle has no regional wall motion abnormalities. The left ventricular internal cavity size was normal in size. There is  moderate left ventricular hypertrophy. Left ventricular diastolic parameters are consistent with Grade II diastolic dysfunction (pseudonormalization). Indeterminate filling pressures. Right Ventricle: The right ventricular size is normal. No increase in right ventricular wall thickness. Right ventricular systolic function is normal. Left Atrium: Left atrial size was severely dilated. Right Atrium: Right atrial size was normal in size.  Pericardium: Trivial pericardial effusion is present. The pericardial effusion is posterior to the left ventricle. Mitral Valve: The mitral valve is abnormal. There is moderate thickening of the mitral valve leaflet(s). Moderate to severe mitral annular calcification. Trivial mitral valve regurgitation. Tricuspid Valve: The tricuspid valve is grossly normal. Tricuspid valve regurgitation is trivial. Aortic Valve: The non-coronary cusp appears calcified and resticted. The aortic valve is tricuspid. Aortic valve regurgitation is trivial. Moderate aortic stenosis is present. Aortic valve mean gradient measures 28.0 mmHg. Aortic valve peak gradient measures 58.1 mmHg. Aortic valve area, by VTI measures 0.76 cm. Pulmonic Valve: The pulmonic valve was grossly normal. Pulmonic valve regurgitation is mild. Aorta: The aortic root and ascending aorta are structurally normal, with no evidence of dilitation. IAS/Shunts: No atrial level shunt detected by color flow Doppler. Additional Comments:  There is a moderate pleural effusion in the left lateral region.  LEFT VENTRICLE PLAX 2D LVIDd:         3.70 cm LVIDs:         2.50 cm LV PW:         1.60 cm LV IVS:        1.60 cm LVOT diam:     1.90 cm LV SV:         71 LV SV Index:   42 LVOT Area:     2.84 cm  RIGHT VENTRICLE             IVC RV Basal diam:  2.30 cm     IVC diam: 2.10 cm RV S prime:     12.10 cm/s TAPSE (M-mode): 2.0 cm LEFT ATRIUM              Index       RIGHT ATRIUM          Index LA diam:        4.40 cm  2.62 cm/m  RA Area:     9.60 cm LA Vol (A2C):   121.0 ml 72.11 ml/m RA Volume:   14.00 ml 8.34 ml/m LA Vol (A4C):   102.0 ml 60.79 ml/m LA Biplane Vol: 114.0 ml 67.94 ml/m  AORTIC VALVE AV Area (Vmax):    1.12 cm AV Area (Vmean):   1.11 cm AV Area (VTI):     0.76 cm AV Vmax:           381.00 cm/s AV Vmean:          239.000 cm/s AV VTI:            0.940 m AV Peak Grad:      58.1 mmHg AV Mean Grad:      28.0 mmHg LVOT Vmax:         151.00 cm/s LVOT Vmean:         93.300 cm/s LVOT VTI:          0.251 m LVOT/AV VTI ratio: 0.27  AORTA Ao Root diam: 3.10 cm Ao Asc diam:  3.50 cm MITRAL VALVE MV Area (PHT): 2.24 cm     SHUNTS MV Decel Time: 338 msec     Systemic VTI:  0.25 m MV E velocity: 163.00 cm/s  Systemic Diam: 1.90 cm MV A velocity: 140.00 cm/s MV E/A ratio:  1.16 [place] Bishop MD Electronically signed by Lyman Bishop MD Signature Date/Time: 05/21/2020/12:37:14 PM    Final         Scheduled Meds: . amLODipine  5 mg Oral Daily  . aspirin EC  81 mg Oral Daily  . diazepam  2.5 mg Oral Daily  . ferrous sulfate  325 mg Oral BID  . furosemide  40 mg Intravenous BID  . insulin aspart  0-9 Units Subcutaneous TID WC  . levothyroxine  37.5 mcg Oral Q0600  . metoprolol succinate  25 mg Oral Daily  . potassium chloride  20 mEq Oral BID  . sodium chloride flush  3 mL Intravenous Q12H   Continuous Infusions: . sodium chloride    . heparin 900 Units/hr (05/21/20 1029)     LOS: 1 day     Cordelia Poche, MD [facility] 05/21/2020, 1:00 PM  If 7PM-7AM, please contact night-coverage [email or web address]

## 2020-05-21 NOTE — Progress Notes (Addendum)
[place] for IV heparin Indication: atrial fibrillation  Allergies  Allergen Reactions  . Codeine Other (See Comments)    Passed out  . Oxycodone Other (See Comments)    Visual Hallucinations  . Roxanol [Morphine] Hives and Rash  . Penicillins Rash    Has patient had a PCN reaction causing immediate rash, facial/tongue/throat swelling, SOB or lightheadedness with hypotension: Yes Has patient had a PCN reaction causing severe rash involving mucus membranes or skin necrosis: Yes Has patient had a PCN reaction that required hospitalization: No Has patient had a PCN reaction occurring within the last 10 years: No If all of the above answers are "NO", then may proceed with Cephalosporin use.     Patient Measurements:   Heparin Dosing Weight: 62 kg (most recent wt 68.7 kg on 4/4)  Vital Signs: Temp: 97.5 F (36.4 C) (04/30 0820) Temp Source: Oral (04/30 0820) BP: 147/87 (04/30 0820) Pulse Rate: 88 (04/30 0820)  Labs: Recent Labs    05/20/20 1255 05/20/20 1400 05/20/20 1453 05/20/20 1905 05/21/20 0008 05/21/20 0918  HGB 11.0*  --   --   --  10.4*  --   HCT 35.2*  --   --   --  33.7*  --   PLT 305  --   --   --  280  --   APTT  --  37*  --   --   --   --   LABPROT  --  12.1  --   --   --   --   INR  --  0.9  --   --   --   --   HEPARINUNFRC  --   --   --   --  0.16* 0.70  CREATININE 2.66*  --   --   --  2.78*  --   TROPONINIHS  --   --  56* 52*  --   --     CrCl cannot be calculated (Unknown ideal weight.).   Medical History: Past Medical History:  Diagnosis Date  . Allergy   . Anemia   . Aortic stenosis   . Aortic stenosis, moderate 06/16/2011  . Blood transfusion without reported diagnosis 2010?  Marland Kitchen Cardiomyopathy ([place])   . CLL (chronic lymphoblastic leukemia) 12/01/2010  . Diabetes mellitus   . Dry skin dermatitis 12/01/2010  . Hyperlipidemia   . Hypertension   . Iron deficiency 12/01/2010  . Monoclonal B-cell  lymphocytosis of unknown significance 12/02/2014  . Osteopenia   . Skin cancer   . Stroke [facility])    1999    Medications:  Scheduled:  . amLODipine  5 mg Oral Daily  . aspirin EC  81 mg Oral Daily  . diazepam  2.5 mg Oral Daily  . ferrous sulfate  325 mg Oral BID  . furosemide  40 mg Intravenous BID  . insulin aspart  0-9 Units Subcutaneous TID WC  . levothyroxine  37.5 mcg Oral Q0600  . metoprolol succinate  25 mg Oral Daily  . potassium chloride  20 mEq Oral BID  . sodium chloride flush  3 mL Intravenous Q12H   Infusions:  . sodium chloride    . heparin 950 Units/hr (05/21/20 0057)    Assessment: 87 yo female with new onset Afib to start IV heparin per pharmacy dosing.  CHA2DS2-VASc Scoreis 6, but history of GI Bleed and recent fall. Baseline CBC- Hg slightly low at 11, pltc WNL. Aptt, PT/INR WNL.  05/21/2020:  HL = 0.70 is therapeutic but on the upper end of therapeutic range on heparin infusion of 950 units/hr  Confirmed with RN that heparin infusing at correct rate. No signs of bleeding.   CBC: Hgb 10.4 - slight drop; Plt WNL/stable  Scr elevated at 2.78  Goal of Therapy:  Heparin level 0.3-0.7 units/ml Monitor platelets by anticoagulation protocol: Yes   Plan:   Slightly decrease heparin infusion to 900 units/hr  Check 8 hour HL  Daily CBC and heparin level while on heparin  F/U long-term anticoagulation plans  Lenis Noon, PharmD 05/21/20 10:14 AM  Addendum - Evening Follow Up:  Assessment:  HL = 0.34 is therapeutic on heparin infusion of 900 units/hr  Confirmed with RN that heparin infusing at correct rate. No signs of bleeding.   Goal: HL 0.3-0.7  Plan:  Continue heparin infusion at current rate of 900 units/hr  Check HL/CBC with AM labs tomorrow  Lenis Noon, PharmD 05/21/20 7:31 PM

## 2020-05-21 NOTE — TOC Initial Note (Signed)
Transition of Care [facility]) - Initial/Assessment Note   Patient Details  Name: Haliey Romberg MRN: [id number] Date of Birth: January 29, 1933  Transition of Care [facility]) CM/SW Contact:    Sherie Don, LCSW Phone Number: 05/21/2020, 2:45 PM  Clinical Narrative: Patient is an 87 year old female who was admitted for acute CHF. PT evaluation recommend SNF unless patient has 24/7 supervision at home. CSW met with patient to discuss recommendations. Per patient, she resides alone and will not have 24/7 supervision at home. Patient is moderately agreeable to referral for SNF and is aware she can decline if she changes her mind. Patient has been vaccinated and boosted for COVID.  FL2 completed; PASRR verified. Initial referral faxed out. Facility will need to start insurance authorization. TOC awaiting bed offers.  Expected Discharge Plan: Skilled Nursing Facility Barriers to Discharge: Continued Medical Work up  Patient Goals and CMS Choice Patient states their goals for this hospitalization and ongoing recovery are:: Get stronger CMS Medicare.gov Compare Post Acute Care list provided to:: Patient Choice offered to / list presented to : Patient  Expected Discharge Plan and Services Expected Discharge Plan: [place] In-house Referral: Clinical Social Work   Post Acute Care Choice: [place] Living arrangements for the past 2 months: [place]                 DME Arranged: N/A DME Agency: NA                  Prior Living Arrangements/Services Living arrangements for the past 2 months: [place] Lives with:: Self Patient language and need for interpreter reviewed:: Yes Do you feel safe going back to the place where you live?: Yes      Need for Family Participation in Patient Care: No (Comment) Care giver support system in place?: Yes (comment)   Criminal Activity/Legal Involvement Pertinent to Current Situation/Hospitalization: No  - Comment as needed  Activities of Daily Living Home Assistive Devices/Equipment: Gilford Rile (specify type) ADL Screening (condition at time of admission) Patient's cognitive ability adequate to safely complete daily activities?: Yes Is the patient deaf or have difficulty hearing?: No Does the patient have difficulty seeing, even when wearing glasses/contacts?: No Does the patient have difficulty concentrating, remembering, or making decisions?: No Patient able to express need for assistance with ADLs?: No Does the patient have difficulty dressing or bathing?: No Independently performs ADLs?: Yes (appropriate for developmental age) Does the patient have difficulty walking or climbing stairs?: No Weakness of Legs: Both Weakness of Arms/Hands: None  Permission Sought/Granted Permission sought to share information with : Facility Art therapist granted to share information with : Yes, Verbal Permission Granted     Permission granted to share info w AGENCY: SNFs        Emotional Assessment Appearance:: Appears stated age Attitude/Demeanor/Rapport: Engaged Affect (typically observed): Accepting Orientation: : Oriented to Self,Oriented to Place,Oriented to  Time,Oriented to Situation Alcohol / Substance Use: Not Applicable Psych Involvement: No (comment)  Admission diagnosis:  Shortness of breath [R06.02] Pleural effusion [J90] Acute CHF ([place]) [I50.9] Left leg weakness [R29.898] Atrial fibrillation, unspecified type [facility]) [I48.91] Patient Active Problem List   Diagnosis Date Noted  . Acute CHF ([place]) 05/20/2020  . Skin lesion 04/25/2020  . Breast mass in female 04/25/2020  . Macular degeneration of both eyes 04/25/2020  . Vitamin D deficiency 10/26/2019  . Shingles 08/11/2016  . Gastritis 08/08/2016  . GI bleeding 08/01/2016  . Symptomatic anemia   .  Acquired hypothyroidism 03/13/2016  . DNR (do not resuscitate) discussion 03/13/2016  . CKD (chronic kidney  disease) stage 4, GFR 15-29 ml/min (HCC) 08/25/2015  . Monoclonal B-cell lymphocytosis of unknown significance 12/02/2014  . Asymptomatic microscopic hematuria 12/02/2014  . Anemia of chronic kidney failure 12/02/2014  . Peripheral edema 02/03/2013  . Post-menopausal bleeding 02/03/2013  . Esophageal reflux 02/03/2013  . Constipation 02/03/2013  . Anxiety 01/04/2013  . AKI (acute kidney injury) ([place]) 12/15/2012  . Acute fulminating appendicitis with perforation and peritonitis 12/14/2012  . Depression 06/11/2012  . Daytime hypersomnolence 06/11/2012  . Hypertrophic obstructive cardiomyopathy ([place]) 06/20/2011  . Bruit 06/20/2011  . Claudication ([place]) 06/20/2011  . Aortic stenosis, moderate 06/16/2011  . Vertigo 12/02/2010  . Iron deficiency 12/01/2010  . Dry skin dermatitis 12/01/2010  . CLL (chronic lymphocytic leukemia) ([place]) 12/01/2010  . Fatigue 12/01/2010  . Allergy   . Colon polyps   . Diabetes ([place])   . Hyperlipidemia   . Hypertension   . Anemia   . Osteopenia   . Encounter for well adult exam with abnormal findings 11/24/2010   PCP:  Hoyt Koch, MD Pharmacy:   [facility] [place], [address] [place] [address] Phone: [phone] Fax: [phone]  [facility] [place], [address] [place] [address] Phone: [phone] Fax: [phone]     Social Determinants of Health (SDOH) Interventions    Readmission Risk Interventions No flowsheet data found.

## 2020-05-21 NOTE — Progress Notes (Signed)
  Echocardiogram 2D Echocardiogram has been performed.  Randa Lynn Kort Stettler 05/21/2020, 12:17 PM

## 2020-05-21 NOTE — Evaluation (Signed)
Occupational Therapy Evaluation Patient Details Name: Norma Smith MRN: [id number] DOB: 26-Jul-1933 Today's Date: 05/21/2020    History of Present Illness Norma Smith is a 87 y.o. female with medical history significant of iron deficiency anemia, aortic stenosis, hypertrophic cardiomyopathy, anxiety, CKD stage IV, diabetes mellitus, hyperlipidemia, hypertension, macular degeneration, hypothyroidism. Patient presents with worsening dyspnea over the past few weeks and found to be in A-fib. Pt also suffered a fall when exiting her shower on 05/19/2020.   Clinical Impression   Patient is currently requiring assistance with ADLs including minimal assist to min guard assist with toileting, LE dressing, and with lower body bathing, and setup assist for seated UE dressing and grooming, all of which is below patient's typical baseline of being Independent to Modified independent.  During this evaluation, patient was limited by generalized weakness with report of LEs feels "shaky" and "very weak" when standing and ambulating, as well as impaired activity tolerance and baseline back pain ans visual deficits from macular degeneration, which has the potential to impact patient's safety and independence during functional mobility, as well as performance for ADLs. [place] "6-clicks" Daily Activity Inpatient Short Form score of 19/24 indicates 42.80% ADL impairment this session. Patient lives alone in what is believes to be a 55+ retirement apartment community that offers no assistance other than [place] transportation.  t reports her daughter is very busy with limited ability to assist. Patient demonstrates good rehab potential, and should benefit from continued skilled occupational therapy services while in acute care to maximize safety, independence and quality of life at home.  Continued occupational therapy services in a SNF setting prior to return home is recommended.  ?   Follow Up  Recommendations  SNF    Equipment Recommendations   (Grab abrs within bathroom recommended.  Will also defer to post-acute recommednations.)    Recommendations for Other Services       Precautions / Restrictions Precautions Precautions: Fall Restrictions Weight Bearing Restrictions: No      Mobility Bed Mobility               General bed mobility comments: Pt up in recliner at start of visit.    Transfers Overall transfer level: Needs assistance   Transfers: Sit to/from Stand;Stand Pivot Transfers Sit to Stand: Supervision Stand pivot transfers: Min guard       General transfer comment: Ambulation in hallway, limited distance with rollator and Min guard assist. Additional assist to position, steady and lock rollator.    Balance Overall balance assessment: Needs assistance Sitting-balance support: Feet supported Sitting balance-Leahy Scale: Good     Standing balance support: Bilateral upper extremity supported Standing balance-Leahy Scale: Poor Standing balance comment: Fair static and poor dynamic standing with need of BUE support on Rollator.                           ADL either performed or assessed with clinical judgement   ADL Overall ADL's : Needs assistance/impaired Eating/Feeding: Independent   Grooming: Set up;Sitting   Upper Body Bathing: Supervision/ safety;Sitting;Set up   Lower Body Bathing: Minimal assistance;Sit to/from stand   Upper Body Dressing : Supervision/safety;Set up;Sitting   Lower Body Dressing: Minimal assistance;Sit to/from stand   Toilet Transfer: Min Public librarian Details (indicate cue type and reason): Very limited tolerance for ambulation distance. Toileting- Clothing Manipulation and Hygiene: Minimal assistance;Sit to/from stand;Sitting/lateral lean   Tub/ Shower Transfer: Minimal assistance   Functional mobility during ADLs:  Min guard (Pt's own rollator from home which appears too  short.)       Vision Baseline Vision/History: Macular Degeneration;Wears glasses (Actively followe by eye doctor.) Wears Glasses: At all times Patient Visual Report: No change from baseline       Perception     Praxis      Pertinent Vitals/Pain Pain Assessment: 0-10 Pain Score: 8  Pain Location: HA= 5 and back= 8-9.  Pt reports back pain is chronic.     Hand Dominance Left (Reports recently dropping things with either hand.)   Extremity/Trunk Assessment Upper Extremity Assessment Upper Extremity Assessment: Generalized weakness (AROM intact)   Lower Extremity Assessment Lower Extremity Assessment: Defer to PT evaluation   Cervical / Trunk Assessment Cervical / Trunk Assessment: Normal   Communication Communication Communication: No difficulties   Cognition Arousal/Alertness: Awake/alert Behavior During Therapy: WFL for tasks assessed/performed Overall Cognitive Status: Within Functional Limits for tasks assessed                                     General Comments       Exercises     Shoulder Instructions      Home Living Family/patient expects to be discharged to:: Skilled nursing facility (Initially thought to be Independent Living but they offer no services except van. Pt states may be 55+ retirement apartments as opposed to [employer].) Living Arrangements: Alone   Type of Home: Apartment (3rd floor) Home Access: Elevator     Home Layout: One level     Bathroom Shower/Tub: Occupational psychologist: Standard     Home Equipment: Shower seat - built in;Walker - 4 wheels;Cane - single point   Additional Comments: limited  driving with plan to stop completely and use van transportation provided by apartments.      Prior Functioning/Environment Level of Independence: Independent        Comments: per pt does not use rollator, uses cane sometimes, daughter available limited, dtr  can assist with meds,        OT Problem List:  Decreased strength;Pain;Cardiopulmonary status limiting activity;Decreased activity tolerance;Decreased safety awareness;Impaired vision/perception;Impaired UE functional use;Impaired balance (sitting and/or standing);Decreased knowledge of use of DME or AE      OT Treatment/Interventions: Self-care/ADL training;Therapeutic exercise;Therapeutic activities;Energy conservation;Visual/perceptual remediation/compensation;Patient/family education;DME and/or AE instruction;Balance training    OT Goals(Current goals can be found in the care plan section) Acute Rehab OT Goals Patient Stated Goal: Get strength back. OT Goal Formulation: With patient Time For Goal Achievement: 06/04/20 Potential to Achieve Goals: Good ADL Goals Pt Will Perform Lower Body Dressing: with modified independence;with adaptive equipment;sit to/from stand Pt Will Transfer to Toilet: with modified independence;ambulating Pt Will Perform Toileting - Clothing Manipulation and hygiene: with modified independence;sit to/from stand Pt Will Perform Tub/Shower Transfer: with modified independence (Walk-in shower) Additional ADL Goal #1: Pt will stand at sink and perform 3/3 grooming tasks Mod Independent in order to demonstrate standing balance and tolerance needed to return home with increased safety. Additional ADL Goal #2: Patient will tolerate BUE home exercise program 10-15 reps x2 sets with theraband if pain-free and able, in an unsupported seated position, in order to improve upper body strength, endurance and core stability needed to complete home ADLs.  OT Frequency: Min 2X/week   Barriers to D/C: Decreased caregiver support  lives alone.       Co-evaluation PT/OT/SLP Co-Evaluation/Treatment: Yes Reason for Co-Treatment:  For patient/therapist safety   OT goals addressed during session: ADL's and self-care      AM-PAC OT "6 Clicks" Daily Activity     Outcome Measure Help from another person eating meals?: None Help  from another person taking care of personal grooming?: A Little Help from another person toileting, which includes using toliet, bedpan, or urinal?: A Little Help from another person bathing (including washing, rinsing, drying)?: A Little Help from another person to put on and taking off regular upper body clothing?: A Little Help from another person to put on and taking off regular lower body clothing?: A Little 6 Click Score: 19   End of Session Equipment Utilized During Treatment: Gait belt;Rolling walker;Oxygen Nurse Communication: Mobility status  Activity Tolerance: Patient limited by fatigue Patient left: in chair;with call bell/phone within reach;with chair alarm set (MD in room)  OT Visit Diagnosis: History of falling (Z91.81);Muscle weakness (generalized) (M62.81);Pain;Low vision, both eyes (H54.2);Unsteadiness on feet (R26.81) Pain - part of body:  (Back and head)                Time: 5848-3507 OT Time Calculation (min): 22 min Charges:  OT General Charges $OT Visit: 1 Visit OT Evaluation $OT Eval Low Complexity: 1 Low  Tawnee Clegg, [place] Office: [phone] 05/21/2020  Julien Girt 05/21/2020, 12:00 PM

## 2020-05-21 NOTE — Progress Notes (Signed)
[place] for IV heparin Indication: atrial fibrillation  Allergies  Allergen Reactions  . Codeine Other (See Comments)    Passed out  . Oxycodone Other (See Comments)    Visual Hallucinations  . Roxanol [Morphine] Hives and Rash  . Penicillins Rash    Has patient had a PCN reaction causing immediate rash, facial/tongue/throat swelling, SOB or lightheadedness with hypotension: Yes Has patient had a PCN reaction causing severe rash involving mucus membranes or skin necrosis: Yes Has patient had a PCN reaction that required hospitalization: No Has patient had a PCN reaction occurring within the last 10 years: No If all of the above answers are "NO", then may proceed with Cephalosporin use.     Patient Measurements:   Heparin Dosing Weight: 68.7 kg  Vital Signs: Temp: 98 F (36.7 C) (04/29 2054) Temp Source: Oral (04/29 2054) BP: 191/64 (04/29 2054) Pulse Rate: 85 (04/29 2054)  Labs: Recent Labs    05/20/20 1255 05/20/20 1400 05/20/20 1453 05/20/20 1905 05/21/20 0008  HGB 11.0*  --   --   --  10.4*  HCT 35.2*  --   --   --  33.7*  PLT 305  --   --   --  280  APTT  --  37*  --   --   --   LABPROT  --  12.1  --   --   --   INR  --  0.9  --   --   --   HEPARINUNFRC  --   --   --   --  0.16*  CREATININE 2.66*  --   --   --   --   TROPONINIHS  --   --  56* 52*  --     CrCl cannot be calculated (Unknown ideal weight.).   Medical History: Past Medical History:  Diagnosis Date  . Allergy   . Anemia   . Aortic stenosis   . Aortic stenosis, moderate 06/16/2011  . Blood transfusion without reported diagnosis 2010?  Marland Kitchen Cardiomyopathy ([place])   . CLL (chronic lymphoblastic leukemia) 12/01/2010  . Diabetes mellitus   . Dry skin dermatitis 12/01/2010  . Hyperlipidemia   . Hypertension   . Iron deficiency 12/01/2010  . Monoclonal B-cell lymphocytosis of unknown significance 12/02/2014  . Osteopenia   . Skin cancer   . Stroke [facility])     1999    Medications:  Scheduled:  . amLODipine  5 mg Oral Daily  . aspirin EC  81 mg Oral Daily  . diazepam  2.5 mg Oral Daily  . ferrous sulfate  325 mg Oral BID  . furosemide  40 mg Intravenous BID  . insulin aspart  0-9 Units Subcutaneous TID WC  . levothyroxine  37.5 mcg Oral Q0600  . metoprolol succinate  25 mg Oral Daily  . potassium chloride  20 mEq Oral BID  . sodium chloride flush  3 mL Intravenous Q12H   Infusions:  . sodium chloride    . heparin 750 Units/hr (05/20/20 1537)    Assessment: 87 yo female with Afib to start IV heparin per pharmacy dosing.  CHA2DS2-VASc Scoreis 6, but history of GI Bleed and recent fall. Baseline CBC- Hg slightly low at 11, pltc WNL. Aptt, PT/INR WNL.   05/21/2020:  Initial heparin level 0.16- subtherapeutic on IV heparin 750 units/hr  CBC- Slight drop in Hg 10.4, pltc WNL  Scr elevated at 2.66  No bleeding or infusion related concerns reported  by RN  Goal of Therapy:  Heparin level 0.3-0.7 units/ml Monitor platelets by anticoagulation protocol: Yes   Plan:   Re-bolus IV heparin 2000 unit bolus then  Increase IV heparin rate to 950 units/hr  Check heparin level 8 hours after rate change  Daily CBC and heparin level while on heparin  F/U long-term anticoagulation plans   Netta Cedars, PharmD, BCPS 05/21/2020 12:38 AM

## 2020-05-22 ENCOUNTER — Inpatient Hospital Stay (HOSPITAL_COMMUNITY): Payer: Medicare HMO

## 2020-05-22 DIAGNOSIS — E039 Hypothyroidism, unspecified: Secondary | ICD-10-CM | POA: Diagnosis not present

## 2020-05-22 DIAGNOSIS — I160 Hypertensive urgency: Secondary | ICD-10-CM

## 2020-05-22 DIAGNOSIS — R3121 Asymptomatic microscopic hematuria: Secondary | ICD-10-CM | POA: Diagnosis not present

## 2020-05-22 DIAGNOSIS — E7800 Pure hypercholesterolemia, unspecified: Secondary | ICD-10-CM | POA: Diagnosis not present

## 2020-05-22 DIAGNOSIS — I5031 Acute diastolic (congestive) heart failure: Secondary | ICD-10-CM | POA: Diagnosis not present

## 2020-05-22 DIAGNOSIS — I499 Cardiac arrhythmia, unspecified: Secondary | ICD-10-CM

## 2020-05-22 DIAGNOSIS — N184 Chronic kidney disease, stage 4 (severe): Secondary | ICD-10-CM | POA: Diagnosis not present

## 2020-05-22 DIAGNOSIS — I421 Obstructive hypertrophic cardiomyopathy: Secondary | ICD-10-CM | POA: Diagnosis not present

## 2020-05-22 DIAGNOSIS — R778 Other specified abnormalities of plasma proteins: Secondary | ICD-10-CM

## 2020-05-22 DIAGNOSIS — E785 Hyperlipidemia, unspecified: Secondary | ICD-10-CM

## 2020-05-22 LAB — CBC
HCT: 34.8 % — ABNORMAL LOW (ref 36.0–46.0)
Hemoglobin: 10.5 g/dL — ABNORMAL LOW (ref 12.0–15.0)
MCH: 30.6 pg (ref 26.0–34.0)
MCHC: 30.2 g/dL (ref 30.0–36.0)
MCV: 101.5 fL — ABNORMAL HIGH (ref 80.0–100.0)
Platelets: 279 10*3/uL (ref 150–400)
RBC: 3.43 MIL/uL — ABNORMAL LOW (ref 3.87–5.11)
RDW: 13.7 % (ref 11.5–15.5)
WBC: 13.5 10*3/uL — ABNORMAL HIGH (ref 4.0–10.5)
nRBC: 0 % (ref 0.0–0.2)

## 2020-05-22 LAB — BASIC METABOLIC PANEL
Anion gap: 10 (ref 5–15)
BUN: 45 mg/dL — ABNORMAL HIGH (ref 8–23)
CO2: 25 mmol/L (ref 22–32)
Calcium: 7.9 mg/dL — ABNORMAL LOW (ref 8.9–10.3)
Chloride: 108 mmol/L (ref 98–111)
Creatinine, Ser: 2.92 mg/dL — ABNORMAL HIGH (ref 0.44–1.00)
GFR, Estimated: 15 mL/min — ABNORMAL LOW (ref >60)
Glucose, Bld: 158 mg/dL — ABNORMAL HIGH (ref 70–99)
Potassium: 4.5 mmol/L (ref 3.5–5.1)
Sodium: 143 mmol/L (ref 135–145)

## 2020-05-22 LAB — HEPARIN LEVEL (UNFRACTIONATED)
Heparin Unfractionated: 0.17 IU/mL — ABNORMAL LOW (ref 0.30–0.70)
Heparin Unfractionated: 0.31 IU/mL (ref 0.30–0.70)
Heparin Unfractionated: 0.35 IU/mL (ref 0.30–0.70)

## 2020-05-22 LAB — GLUCOSE, CAPILLARY
Glucose-Capillary: 116 mg/dL — ABNORMAL HIGH (ref 70–99)
Glucose-Capillary: 164 mg/dL — ABNORMAL HIGH (ref 70–99)
Glucose-Capillary: 149 mg/dL — ABNORMAL HIGH (ref 70–99)
Glucose-Capillary: 159 mg/dL — ABNORMAL HIGH (ref 70–99)

## 2020-05-22 LAB — MAGNESIUM: Magnesium: 1.8 mg/dL (ref 1.7–2.4)

## 2020-05-22 MED ORDER — HYDRALAZINE HCL 25 MG PO TABS: 25.0000 mg | ORAL_TABLET | Freq: Four times a day (QID) | ORAL | Status: DC | PRN

## 2020-05-22 MED ORDER — AMLODIPINE BESYLATE 10 MG PO TABS
10.0000 mg | ORAL_TABLET | Freq: Every day | ORAL | Status: DC
  Administered 2020-05-23: 10 mg via ORAL
  Filled 2020-05-22: qty 1

## 2020-05-22 MED ORDER — AMLODIPINE BESYLATE 5 MG PO TABS
5.0000 mg | ORAL_TABLET | Freq: Once | ORAL | Status: AC
  Administered 2020-05-22: 5 mg via ORAL
  Filled 2020-05-22: qty 1

## 2020-05-22 MED ORDER — HEPARIN (PORCINE) 25000 UT/250ML-% IV SOLN
950.0000 [IU]/h | INTRAVENOUS | Status: DC
  Administered 2020-05-22 (×2): 950 [IU]/h via INTRAVENOUS
  Filled 2020-05-22: qty 250

## 2020-05-22 NOTE — Progress Notes (Signed)
PROGRESS NOTE    Norma Smith  ASN:[id number] DOB: 09/03/1933 DOA: 05/20/2020 PCP: Hoyt Koch, MD   Brief Narrative: Norma Smith is a 87 y.o. female with medical history significant of iron deficiency anemia, aortic stenosis, hypertrophic cardiomyopathy, anxiety, CKD stage IV, diabetes mellitus, hyperlipidemia, hypertension, macular degeneration, hypothyroidism. Patient presented secondary to shortness of breath and found to have evidence of new heart failure. IV lasix started.   Assessment & Plan:   Active Problems:   Diabetes ([place])   Hyperlipidemia   Hypertension   Hypertrophic obstructive cardiomyopathy (HCC)   Depression   Anxiety   Monoclonal B-cell lymphocytosis of unknown significance   Asymptomatic microscopic hematuria   CKD (chronic kidney disease) stage 4, GFR 15-29 ml/min (HCC)   Acquired hypothyroidism   Macular degeneration of both eyes   Acute CHF ([place])   Acute diastolic heart failure New diagnosis. Patient with a history of hypertrophic cardiomyopathy/aortic stenosis. BNP of 1151.9. Anasarca. Possibly precipitated by new onset atrial fibrillation. Currently not in RVR -Daily BMP -No ACEi/ARB secondary to advanced CKD. -Continue Toprol XL -Daily weights/strict in and out -Cardiology recommendations pending -Hold lasix pending nephrologist recommendations  Paroxysmal atrial fibrillation New diagnosis.  Currently in atrial fibrillation with rate controlled. CHA2DS2-VASc Scoreis 6.  Plan drip started in the ED.  Patient is on Toprol-XL and aspirin as an outpatient. Symptoms of palpitation seem to correlate with recent increase of Synthroid from 25 mcg to 50 mcg. Transthoracic Echocardiogram significant for left atrial enlargement. -Continue Toprol XL -Continue Heparin drip for now; complicated by history of GI bleed of unknown etiology 4 years prior -Cardiology recommendations pending  Pleural effusion Moderate left side consistent with fluid  overload picture from acute CHF. Management above  Hypertrophic cardiomyopathy Aortic stenosis Patient was previously following with cardiology as an outpatient. Moderate stenosis on most recent Transthoracic Echocardiogram.  Hypertensive urgency Improved with home medications. -Continue metoprolol, increase amlodipine -Orthostatic vitals  AKI on CKD stage IV Baseline creatine is about 2.11 from 04/22/20. Previously, baseline of 1.0-1.1 from 2020. Likely acute injury from heart failure. Slightly up today. -Lasix as above -Daily BMP  Anasarca In setting of heart failure vs renal disease. Management above.  Diabetes mellitus, type II Last hemoglobin A1C of 6.3%. -SSI while inpatient  Hypothyroidism Patient previously on Synthroid 25 mcg which was increased to Synthroid 50 mcg on 04/25/2020 for TSH of 11.5. TSH on admission of 5.745 -Continue Synthroid 37.5 mcg daily  Anxiety -Continue Valium  Monoclonal B-cell lymphocytosis of unknown significance Patient previously was followed by oncology.  Currently not on any management.  History of GI bleeding At that time in 2018 but no definitive etiology found.  Patient with a history of diverticulosis, hemorrhoids, gastritis.  Iron deficiency anemia Continue iron supplementation   DVT prophylaxis: Heparin IV Code Status:   Code Status: DNR Family Communication: None at bedside Disposition Plan: Discharge to ILF vs SNF pending functional status improvement, cardiology recommendations.   Consultants:   Cardiology  Procedures:   TRANSTHORACIC ECHOCARDIOGRAM (05/21/2020) IMPRESSIONS    1. Left ventricular ejection fraction, by estimation, is 60 to 65%. The  left ventricle has normal function. The left ventricle has no regional  wall motion abnormalities. There is moderate left ventricular hypertrophy.  Left ventricular diastolic  parameters are consistent with Grade II diastolic dysfunction  (pseudonormalization).   2. Right ventricular systolic function is normal. The right ventricular  size is normal.  3. Left atrial size was severely dilated.  4. The trivial pericardial  effusion is posterior to the left ventricle.  Moderate pleural effusion in the left lateral region.  5. The mitral valve is abnormal. Trivial mitral valve regurgitation.  Moderate to severe mitral annular calcification.  6. The aortic valve is trileaflet, however, the non-coronary cusp is  calcified and restricted. Aortic valve regurgitation is trivial. Moderate  aortic valve stenosis. Aortic valve area, by VTI measures 0.76 cm. Aortic  valve mean gradient measures 28.0  mmHg. Aortic valve Vmax measures 3.81 m/s.   Comparison(s): Prior images unable to be directly viewed, comparison made  by report only. Changes from prior study are noted. 08/02/2016: LVEF  65-70%, grade 2 DD, mild AS -13 mmHg mean gradient.   Conclusion(s)/Recommendation(s): Consider further aortic valve evaluation  with TEE.   Antimicrobials:  None    Subjective: Patient reports some lightheadedness with ambulation. No chest pain or dyspnea  Objective: Vitals:   05/22/20 1105 05/22/20 1108 05/22/20 1110 05/22/20 1113  BP: (!) 192/70 (!) 203/82 (!) 194/79 (!) 204/78  Pulse: 78 76 79 78  Resp:      Temp:      TempSrc:      SpO2:      Weight:      Height:        Intake/Output Summary (Last 24 hours) at 05/22/2020 1148 Last data filed at 05/22/2020 0900 Gross per 24 hour  Intake 1020.11 ml  Output 900 ml  Net 120.11 ml   Filed Weights   05/21/20 1352 05/22/20 0500  Weight: 68.6 kg 69.2 kg    Examination:  General exam: Appears calm and comfortable Respiratory system: Clear to auscultation. Respiratory effort normal. Cardiovascular system: S1 & S2 heard, RRR. 2/6 systolic murmur. Edema of legs and abdomen Gastrointestinal system: Abdomen is nondistended, soft and nontender. No organomegaly or masses felt. Normal bowel sounds  heard. Central nervous system: Alert and oriented. No focal neurological deficits. Musculoskeletal: No calf tenderness Skin: No cyanosis. No rashes Psychiatry: Judgement and insight appear normal. Mood & affect appropriate.    Data Reviewed: I have personally reviewed following labs and imaging studies  CBC Lab Results  Component Value Date   WBC 13.5 (H) 05/22/2020   RBC 3.43 (L) 05/22/2020   HGB 10.5 (L) 05/22/2020   HCT 34.8 (L) 05/22/2020   MCV 101.5 (H) 05/22/2020   MCH 30.6 05/22/2020   PLT 279 05/22/2020   MCHC 30.2 05/22/2020   RDW 13.7 05/22/2020   LYMPHSABS 2.9 05/20/2020   MONOABS 1.0 05/20/2020   EOSABS 0.1 05/20/2020   BASOSABS 0.1 01/60/1093     Last metabolic panel Lab Results  Component Value Date   NA 143 05/22/2020   K 4.5 05/22/2020   CL 108 05/22/2020   CO2 25 05/22/2020   BUN 45 (H) 05/22/2020   CREATININE 2.92 (H) 05/22/2020   GLUCOSE 158 (H) 05/22/2020   GFRNONAA 15 (L) 05/22/2020   GFRAA 52 (L) 08/04/2016   CALCIUM 7.9 (L) 05/22/2020   PROT 5.9 (L) 04/22/2020   ALBUMIN 2.8 (L) 04/22/2020   BILITOT 0.4 04/22/2020   ALKPHOS 64 04/22/2020   AST 22 04/22/2020   ALT 14 04/22/2020   ANIONGAP 10 05/22/2020    CBG (last 3)  Recent Labs    05/21/20 2146 05/22/20 0721 05/22/20 1101  GLUCAP 131* 116* 164*     GFR: Estimated Creatinine Clearance: 12.8 mL/min (A) (by C-G formula based on SCr of 2.92 mg/dL (H)).  Coagulation Profile: Recent Labs  Lab 05/20/20 1400  INR 0.9  Recent Results (from the past 240 hour(s))  SARS CORONAVIRUS 2 (TAT 6-24 HRS) Nasopharyngeal Nasopharyngeal Swab     Status: None   Collection Time: 05/20/20  3:40 PM   Specimen: Nasopharyngeal Swab  Result Value Ref Range Status   SARS Coronavirus 2 NEGATIVE NEGATIVE Final    Comment: (NOTE) SARS-CoV-2 target nucleic acids are NOT DETECTED.  The SARS-CoV-2 RNA is generally detectable in upper and lower respiratory specimens during the acute phase of  infection. Negative results do not preclude SARS-CoV-2 infection, do not rule out co-infections with other pathogens, and should not be used as the sole basis for treatment or other patient management decisions. Negative results must be combined with clinical observations, patient history, and epidemiological information. The expected result is Negative.  Fact Sheet for Patients: SugarRoll.be  Fact Sheet for Healthcare Providers: https://www.woods-mathews.com/  This test is not yet approved or cleared by the [place] FDA and  has been authorized for detection and/or diagnosis of SARS-CoV-2 by FDA under an Emergency Use Authorization (EUA). This EUA will remain  in effect (meaning this test can be used) for the duration of the COVID-19 declaration under Se ction 564(b)(1) of the Act, 21 U.S.C. section 360bbb-3(b)(1), unless the authorization is terminated or revoked sooner.  Performed at [facility], [place] [address] [place], [place] [address]         Radiology Studies: CT Head Wo Contrast  Result Date: 05/20/2020 CLINICAL DATA:  Neuro deficit, fatigue, lower extremity swelling. EXAM: CT HEAD WITHOUT CONTRAST TECHNIQUE: Contiguous axial images were obtained from the base of the skull through the vertex without intravenous contrast. COMPARISON:  None. FINDINGS: Brain: Mild chronic small vessel ischemic changes within the deep periventricular white matter regions bilaterally. No mass, hemorrhage, edema or other evidence of acute parenchymal abnormality. No extra-axial hemorrhage. Vascular: Chronic calcified atherosclerotic changes of the large vessels at the skull base. No unexpected hyperdense vessel. Skull: Normal. Negative for fracture or focal lesion. Sinuses/Orbits: No acute finding. Other: None. IMPRESSION: 1. No acute findings. No intracranial mass, hemorrhage or edema. 2. Mild chronic small vessel ischemic changes in the white  matter. Electronically Signed   By: Franki Cabot M.D.   On: 05/20/2020 16:24   US RENAL  Result Date: 05/22/2020 CLINICAL DATA:  Acute renal insufficiency EXAM: RENAL / URINARY TRACT ULTRASOUND COMPLETE COMPARISON:  None. FINDINGS: Right Kidney: Renal measurements: 8.5 x 4.1 x 4.3 cm = volume: 89 mL. Contains a 1.9 cm cyst. No hydronephrosis. Mild increased cortical echogenicity. Left Kidney: Renal measurements: 8.7 x 4.0 x 3.9 cm = volume: 70 mL. Contains a 9 mm cyst. Mild increased cortical echogenicity. Bladder: Appears normal for degree of bladder distention. Other: None. IMPRESSION: 1. Mild increased cortical echogenicity suggesting medical renal disease. Small bilateral renal cysts as above. Electronically Signed   By: Dorise Bullion III M.D   On: 05/22/2020 09:40   DG Chest Port 1 View  Result Date: 05/20/2020 CLINICAL DATA:  Lower extremity edema, increasing fatigue EXAM: PORTABLE CHEST 1 VIEW COMPARISON:  07/30/2017 FINDINGS: Single frontal view of the chest demonstrates an enlarged cardiac silhouette, with marked calcification of the mitral annulus. There is a moderate left pleural effusion, with underlying consolidation at the left lung base. Trace right pleural fluid is noted. Increased central vascular congestion. No pneumothorax. IMPRESSION: 1. Dense consolidation at the left lung base, with moderate left pleural effusion. 2. Trace right pleural fluid. 3. Increased central vascular congestion. 4. Enlarged cardiac silhouette, with dense calcification of  the mitral annulus. 5. While the constellation of findings is most consistent with fluid overload, close follow-up is warranted to evaluate for resolution of the dense consolidation at the left lung base. Electronically Signed   By: Randa Ngo M.D.   On: 05/20/2020 15:46   ECHOCARDIOGRAM COMPLETE  Result Date: 05/21/2020    ECHOCARDIOGRAM REPORT   Patient Name:   GRACIEANN STANNARD Date of Exam: 05/21/2020 Medical Rec #:  [id number]     Height:        61.0 in Accession #:    [id number]    Weight:       151.4 lb Date of Birth:  01/18/1934      BSA:          1.678 m Patient Age:    28 years      BP:           147/87 mmHg Patient Gender: F             HR:           74 bpm. Exam Location:  Inpatient Procedure: 2D Echo, Cardiac Doppler and Color Doppler Indications:    I50.23 Acute on chronic systolic (congestive) heart failure  History:        Patient has prior history of Echocardiogram examinations, most                 recent 08/02/2016. Cardiomyopathy, Stroke and COPD, Aortic Valve                 Disease; Risk Factors:Hypertension, Diabetes and Dyslipidemia.                 [place].  Sonographer:    Jonelle Sidle Dance Referring Phys: [place]  1. Left ventricular ejection fraction, by estimation, is 60 to 65%. The left ventricle has normal function. The left ventricle has no regional wall motion abnormalities. There is moderate left ventricular hypertrophy. Left ventricular diastolic parameters are consistent with Grade II diastolic dysfunction (pseudonormalization).  2. Right ventricular systolic function is normal. The right ventricular size is normal.  3. Left atrial size was severely dilated.  4. The trivial pericardial effusion is posterior to the left ventricle. Moderate pleural effusion in the left lateral region.  5. The mitral valve is abnormal. Trivial mitral valve regurgitation. Moderate to severe mitral annular calcification.  6. The aortic valve is trileaflet, however, the non-coronary cusp is calcified and restricted. Aortic valve regurgitation is trivial. Moderate aortic valve stenosis. Aortic valve area, by VTI measures 0.76 cm. Aortic valve mean gradient measures 28.0 mmHg. Aortic valve Vmax measures 3.81 m/s. Comparison(s): Prior images unable to be directly viewed, comparison made by report only. Changes from prior study are noted. 08/02/2016: LVEF 65-70%, grade 2 DD, mild AS -13 mmHg mean gradient.  Conclusion(s)/Recommendation(s): Consider further aortic valve evaluation with TEE. FINDINGS  Left Ventricle: Left ventricular ejection fraction, by estimation, is 60 to 65%. The left ventricle has normal function. The left ventricle has no regional wall motion abnormalities. The left ventricular internal cavity size was normal in size. There is  moderate left ventricular hypertrophy. Left ventricular diastolic parameters are consistent with Grade II diastolic dysfunction (pseudonormalization). Indeterminate filling pressures. Right Ventricle: The right ventricular size is normal. No increase in right ventricular wall thickness. Right ventricular systolic function is normal. Left Atrium: Left atrial size was severely dilated. Right Atrium: Right atrial size was normal in size. Pericardium: Trivial pericardial effusion is present. The pericardial effusion is posterior  to the left ventricle. Mitral Valve: The mitral valve is abnormal. There is moderate thickening of the mitral valve leaflet(s). Moderate to severe mitral annular calcification. Trivial mitral valve regurgitation. Tricuspid Valve: The tricuspid valve is grossly normal. Tricuspid valve regurgitation is trivial. Aortic Valve: The non-coronary cusp appears calcified and resticted. The aortic valve is tricuspid. Aortic valve regurgitation is trivial. Moderate aortic stenosis is present. Aortic valve mean gradient measures 28.0 mmHg. Aortic valve peak gradient measures 58.1 mmHg. Aortic valve area, by VTI measures 0.76 cm. Pulmonic Valve: The pulmonic valve was grossly normal. Pulmonic valve regurgitation is mild. Aorta: The aortic root and ascending aorta are structurally normal, with no evidence of dilitation. IAS/Shunts: No atrial level shunt detected by color flow Doppler. Additional Comments: There is a moderate pleural effusion in the left lateral region.  LEFT VENTRICLE PLAX 2D LVIDd:         3.70 cm LVIDs:         2.50 cm LV PW:         1.60 cm LV  IVS:        1.60 cm LVOT diam:     1.90 cm LV SV:         71 LV SV Index:   42 LVOT Area:     2.84 cm  RIGHT VENTRICLE             IVC RV Basal diam:  2.30 cm     IVC diam: 2.10 cm RV S prime:     12.10 cm/s TAPSE (M-mode): 2.0 cm LEFT ATRIUM              Index       RIGHT ATRIUM          Index LA diam:        4.40 cm  2.62 cm/m  RA Area:     9.60 cm LA Vol (A2C):   121.0 ml 72.11 ml/m RA Volume:   14.00 ml 8.34 ml/m LA Vol (A4C):   102.0 ml 60.79 ml/m LA Biplane Vol: 114.0 ml 67.94 ml/m  AORTIC VALVE AV Area (Vmax):    1.12 cm AV Area (Vmean):   1.11 cm AV Area (VTI):     0.76 cm AV Vmax:           381.00 cm/s AV Vmean:          239.000 cm/s AV VTI:            0.940 m AV Peak Grad:      58.1 mmHg AV Mean Grad:      28.0 mmHg LVOT Vmax:         151.00 cm/s LVOT Vmean:        93.300 cm/s LVOT VTI:          0.251 m LVOT/AV VTI ratio: 0.27  AORTA Ao Root diam: 3.10 cm Ao Asc diam:  3.50 cm MITRAL VALVE MV Area (PHT): 2.24 cm     SHUNTS MV Decel Time: 338 msec     Systemic VTI:  0.25 m MV E velocity: 163.00 cm/s  Systemic Diam: 1.90 cm MV A velocity: 140.00 cm/s MV E/A ratio:  1.16 Lyman Bishop MD Electronically signed by Lyman Bishop MD Signature Date/Time: 05/21/2020/12:37:14 PM    Final         Scheduled Meds: . Derrill Memo ON 05/23/2020] amLODipine  10 mg Oral Daily  . amLODipine  5 mg Oral Once  . aspirin EC  81 mg Oral Daily  .  diazepam  2.5 mg Oral Daily  . ferrous sulfate  325 mg Oral BID  . hydrocerin   Topical BID  . insulin aspart  0-9 Units Subcutaneous TID WC  . levothyroxine  37.5 mcg Oral Q0600  . metoprolol succinate  25 mg Oral Daily  . potassium chloride  20 mEq Oral BID  . sodium chloride flush  3 mL Intravenous Q12H   Continuous Infusions: . sodium chloride    . heparin 950 Units/hr (05/22/20 0736)     LOS: 2 days     Cordelia Poche, MD [facility] 05/22/2020, 11:48 AM  If 7PM-7AM, please contact night-coverage [email or web address]

## 2020-05-22 NOTE — Consult Note (Signed)
Cardiology Consultation:   Patient ID: Norma Smith MRN: [id number]; DOB: August 23, 1933  Admit date: 05/20/2020 Date of Consult: 05/22/2020  PCP:  Hoyt Koch, MD   [place]  Cardiologist:  Remotely Dr. Stanford Breed; new to Dr. Radford Pax Advanced Practice Provider:  No care team member to display Electrophysiologist:  None   Patient Profile:   Norma Smith is a 87 y.o. female with a PMH of hypertrophic cardiomyopathy, aortic stenosis, HTN, HLD, DM type 2, CVA, CKD stage IV, hypothyroidism, and anxiety, who is being seen today for the evaluation of CHF and atrial fibrillation at the request of Dr. Lonny Prude.  History of Present Illness:   Ms. Bozarth was remotely followed by cardiology outpatient with Dr. Stanford Breed in 2013. She has a history of HOCM and moderate aortic stenosis and was recommended for surveillance echocardiograms as she was asymptomatic. Additionally there was concern for vascular pathology given carotid, femoral, and abdominal bruits on exam, as well as complaints of claudication. She'd had no prior ischemic testing and was not interested in pursuing aggressive testing, understanding risk of sudden death. She was started on metoprolol succinate 6m daily and recommended to follow-up in 6 months. She was subsequently seen by cardiology during an admission in 2018 for chest pain in the setting of profound anemia (Hgb 5.3). Echo at that time showed moderate LVH with EF 65-70%, mild AS, mild MS, mild MR, and turbulent flow in the LVOT but no significant gradient at rest or with Valsalva. Her chest pain was attributed to demand ischemia in the setting of anemia and no further ischemic evaluation was recommended at that time with consideration for an outpatient NST after recovery from present illness. She has not been seen by cardiology since that time.   [facility] with complaints of progressive shortness of breath for the past 2 months,  now occurring with minimal activity. She had associated orthopnea and LE edema, as well as complaints of palpitations and dizziness. She reported non-compliance with her lasix. Additionally she had a fall 05/19/20 resulting in ~3 hour down time, ultimately helped up by EMS and was noted to be hypertensive at that time. She saw her PCP 05/20/20 and was recommended to present to the ED given concerns for volume overload.   She has been hypertensive this admission, otherwise VSS. Labs notable for electrolytes wnl, Cr 2.66>2.78>2.92, WBC 14.1>13.5, Hgb 10.5, PLT 279, TSH 5.74, FT4 wnl, HsTrop 56>52, BNP 1151. EKG showed NSR with PACs  and anterior infarct. CXR showed dense consolidation in LLL with moderate left pleural effusion and increased central vascular congestion. CT Head showed no acute findings with chronic small vessel ischemic changes. She was started on a heparin gtt for stroke ppx given concern for atrial fibrillation. She was admitted to medicine and started on IV lasix 431mBID, though dose held this morning due to rising Cr. I&Os show UOP -[id number] the past 24 hours and -217m70mis admission with at least 1 unmeasured UOP occurrence. Echocardiogram this admission showed EF 60-65%, moderate LVH, G2DD, severe LAE, moderate to severe MAC, and moderate AS. Cardiology asked to evaluate.   She tells me that she has been having worsening DOE for several weeks along with increased abdominal fullness and LE edema.  She has not been taking her diuretics consistently. Chest xray with consolidation of the LLL with pleural effusion and increased vascular congestion and BNP elevated at 1151.  Started on IV lasix but renal function has worsened.  Past Medical History:  Diagnosis Date  . Allergy   . Anemia   . Aortic stenosis   . Aortic stenosis, moderate 06/16/2011  . Blood transfusion without reported diagnosis 2010?  Marland Kitchen Cardiomyopathy ([place])   . CLL (chronic lymphoblastic leukemia) 12/01/2010  . Diabetes  mellitus   . Dry skin dermatitis 12/01/2010  . Hyperlipidemia   . Hypertension   . Iron deficiency 12/01/2010  . Monoclonal B-cell lymphocytosis of unknown significance 12/02/2014  . Osteopenia   . Skin cancer   . Stroke [facility])    1999    Past Surgical History:  Procedure Laterality Date  . APPENDECTOMY    . BREAST BIOPSY  1976  . CATARACT EXTRACTION    . COLONOSCOPY WITH PROPOFOL Left 08/03/2016   Procedure: COLONOSCOPY WITH PROPOFOL;  Surgeon: Otis Brace, MD;  Location: [place];  Service: Gastroenterology;  Laterality: Left;  . ESOPHAGOGASTRODUODENOSCOPY (EGD) WITH PROPOFOL Left 08/03/2016   Procedure: ESOPHAGOGASTRODUODENOSCOPY (EGD) WITH PROPOFOL;  Surgeon: Otis Brace, MD;  Location: [facility];  Service: Gastroenterology;  Laterality: Left;  . LAPAROSCOPIC APPENDECTOMY N/A 12/13/2012   Procedure: APPENDECTOMY LAPAROSCOPIC;  Surgeon: Odis Hollingshead, MD;  Location: [facility];  Service: General;  Laterality: N/A;     Home Medications:  Prior to Admission medications   Medication Sig Start Date End Date Taking? Authorizing Provider  amLODipine (NORVASC) 5 MG tablet Take 1 tablet (5 mg total) by mouth daily. 04/26/20  Yes Biagio Borg, MD  aspirin 81 MG tablet Take 1 tablet (81 mg total) by mouth daily. 08/21/16  Yes Buriev, Arie Sabina, MD  Cholecalciferol (D3 PO) Take 1 capsule by mouth daily.   Yes [provider]  diazepam (VALIUM) 5 MG tablet 1/2 tab by mouth daily as needed Patient taking differently: Take 2.5 mg by mouth daily. 10/26/19  Yes Biagio Borg, MD  Ferrous Sulfate (IRON) 325 (65 FE) MG TABS Take 325 mg by mouth 2 (two) times daily.   Yes [provider]  furosemide (LASIX) 40 MG tablet Take 1 tablet (40 mg total) by mouth 2 (two) times daily. 04/25/20  Yes Biagio Borg, MD  levothyroxine (SYNTHROID) 50 MCG tablet Take 1 tablet (50 mcg total) by mouth daily. 04/25/20  Yes Biagio Borg, MD  metoprolol succinate (TOPROL-XL) 25 MG 24 hr  tablet TAKE 1 tab by mouth once daily Patient taking differently: Take 25 mg by mouth daily. 04/25/20  Yes Biagio Borg, MD  Multiple Vitamins-Minerals (CENTRUM SILVER PO) Take 1 tablet by mouth daily.   Yes [provider]  Multiple Vitamins-Minerals (PRESERVISION AREDS 2 PO) Take 1 tablet by mouth 2 (two) times daily.   Yes [provider]  Omega 3-6-9 CAPS Take 1 capsule by mouth daily.   Yes [provider]  potassium chloride (KLOR-CON) 10 MEQ tablet Take 1 tablet (10 mEq total) by mouth daily. 04/25/20  Yes Biagio Borg, MD  VITAMIN D PO Take 1 tablet by mouth daily.   Yes [provider]  atorvastatin (LIPITOR) 20 MG tablet Take 1 tablet (20 mg total) by mouth daily. Patient not taking: No sig reported 12/16/19   Biagio Borg, MD  glucose blood test strip Used to test blood sugar 3 times daily 03/01/15   Biagio Borg, MD    Inpatient Medications: Scheduled Meds: . amLODipine  5 mg Oral Daily  . aspirin EC  81 mg Oral Daily  . diazepam  2.5 mg Oral Daily  . ferrous sulfate  325 mg Oral BID  . hydrocerin   Topical BID  . insulin aspart  0-9 Units Subcutaneous TID WC  . levothyroxine  37.5 mcg Oral Q0600  . metoprolol succinate  25 mg Oral Daily  . potassium chloride  20 mEq Oral BID  . sodium chloride flush  3 mL Intravenous Q12H   Continuous Infusions: . sodium chloride    . heparin 950 Units/hr (05/22/20 0736)   PRN Meds: sodium chloride, acetaminophen, ondansetron (ZOFRAN) IV, sodium chloride flush  Allergies:    Allergies  Allergen Reactions  . Codeine Other (See Comments)    Passed out  . Oxycodone Other (See Comments)    Visual Hallucinations  . Roxanol [Morphine] Hives and Rash  . Penicillins Rash    Has patient had a PCN reaction causing immediate rash, facial/tongue/throat swelling, SOB or lightheadedness with hypotension: Yes Has patient had a PCN reaction causing severe rash involving mucus membranes or skin necrosis:  Yes Has patient had a PCN reaction that required hospitalization: No Has patient had a PCN reaction occurring within the last 10 years: No If all of the above answers are "NO", then may proceed with Cephalosporin use.     Social History:   Social History   Socioeconomic History  . Marital status: Divorced    Spouse name: Not on file  . Number of children: 3  . Years of education: 25  . Highest education level: Not on file  Occupational History  . Occupation: Retired  Tobacco Use  . Smoking status: Former Research scientist (life sciences)  . Smokeless tobacco: Never Used  . Tobacco comment: Stopped smoking 5 yrs. ago.  Vaping Use  . Vaping Use: Never used  Substance and Sexual Activity  . Alcohol use: No    Comment: Occasional  . Drug use: No  . Sexual activity: Not on file    Comment: divorced, 3 daughters, retired administration  Other Topics Concern  . Not on file  Social History Narrative   Lives alone in 3 room apartment.   Social Determinants of Health   Financial Resource Strain: Not on file  Food Insecurity: Not on file  Transportation Needs: Not on file  Physical Activity: Not on file  Stress: Not on file  Social Connections: Not on file  Intimate Partner Violence: Not on file    Family History:    Family History  Problem Relation Age of Onset  . Diabetes Mother   . Diabetes Other   . Cancer Other        breast cancer  . Heart disease Other   . Diabetes Other   . Cancer Maternal Aunt        breast  . Heart disease Maternal Aunt      ROS:  Please see the history of present illness.   All other ROS reviewed and negative.     Physical Exam/Data:   Vitals:   05/21/20 1657 05/21/20 2124 05/22/20 0500 05/22/20 0532  BP: (!) 167/66 (!) 167/99  (!) 160/81  Pulse: 74 79  85  Resp: (!) 21 (!) 22  (!) 24  Temp: 97.8 F (36.6 C) 98.1 F (36.7 C)  97.6 F (36.4 C)  TempSrc: Oral Oral  Oral  SpO2: 97% 97%  95%  Weight:   69.2 kg   Height:        Intake/Output Summary  (Last 24 hours) at 05/22/2020 0837 Last data filed at 05/22/2020 0510 Gross per 24 hour  Intake 660.11 ml  Output 900 ml  Net -239.89 ml   Last 3 Weights 05/22/2020 05/21/2020 04/25/2020  Weight (lbs) 152 lb 8.9 oz 151 lb 3.8 oz 151 lb 6.4 oz  Weight (kg) 69.2 kg 68.6 kg 68.675 kg     Body mass index is 28.83 kg/m.  General:  Well nourished, well developed, in no acute distress HEENT: normal Lymph: no adenopathy Neck: no JVD Endocrine:  No thryomegaly Vascular: No carotid bruits; FA pulses 2+ bilaterally without bruits  Cardiac:  normal S1, S2; RRR; no murmur  Lungs:  Decreased BS in left lung fields Abd: soft, nontender, no hepatomegaly  Ext: 2+ LE edema with skin changes of chronic LE venous insuff Musculoskeletal:  No deformities, BUE and BLE strength normal and equal Skin: warm and dry  Neuro:  CNs 2-12 intact, no focal abnormalities noted Psych:  Normal affect   EKG:  The EKG was personally reviewed and demonstrates:  NSR with PACs and anterior infarct and nonspecific ST abnormality Telemetry:  Telemetry was personally reviewed and demonstrates:  NSR  Relevant CV Studies: Echocardiogram 05/21/20: 1. Left ventricular ejection fraction, by estimation, is 60 to 65%. The  left ventricle has normal function. The left ventricle has no regional  wall motion abnormalities. There is moderate left ventricular hypertrophy.  Left ventricular diastolic  parameters are consistent with Grade II diastolic dysfunction  (pseudonormalization).  2. Right ventricular systolic function is normal. The right ventricular  size is normal.  3. Left atrial size was severely dilated.  4. The trivial pericardial effusion is posterior to the left ventricle.  Moderate pleural effusion in the left lateral region.  5. The mitral valve is abnormal. Trivial mitral valve regurgitation.  Moderate to severe mitral annular calcification.  6. The aortic valve is trileaflet, however, the non-coronary cusp is   calcified and restricted. Aortic valve regurgitation is trivial. Moderate  aortic valve stenosis. Aortic valve area, by VTI measures 0.76 cm. Aortic  valve mean gradient measures 28.0  mmHg. Aortic valve Vmax measures 3.81 m/s.   Comparison(s): Prior images unable to be directly viewed, comparison made  by report only. Changes from prior study are noted. 08/02/2016: LVEF  65-70%, grade 2 DD, mild AS -13 mmHg mean gradient.   Laboratory Data:  High Sensitivity Troponin:   Recent Labs  Lab 05/20/20 1453 05/20/20 1905  TROPONINIHS 56* 52*     Chemistry Recent Labs  Lab 05/20/20 1255 05/21/20 0008 05/22/20 0434  NA 142 142 143  K 4.0 4.3 4.5  CL 108 107 108  CO2 _0 GLUCOSE 121* 131* 158*  BUN 41* 40* 45*  CREATININE 2.66* 2.78* 2.92*  CALCIUM 7.9* 7.8* 7.9*  GFRNONAA 17* 16* 15*  ANIONGAP _1 No results for input(s): PROT, ALBUMIN, AST, ALT, ALKPHOS, BILITOT in the last 168 hours. Hematology Recent Labs  Lab 05/20/20 1255 05/21/20 0008 05/22/20 0434  WBC 14.1* 12.2* 13.5*  RBC 3.60* 3.40* 3.43*  HGB 11.0* 10.4* 10.5*  HCT 35.2* 33.7* 34.8*  MCV 97.8 99.1 101.5*  MCH 30.6 30.6 30.6  MCHC 31.3 30.9 30.2  RDW 13.6 13.5 13.7  PLT 305 280 279   BNP Recent Labs  Lab 05/20/20 1255  BNP 1,151.9*    DDimer No results for input(s): DDIMER in the last 168 hours.   Radiology/Studies:  CT Head Wo Contrast  Result Date: 05/20/2020 CLINICAL DATA:  Neuro deficit, fatigue, lower extremity swelling. EXAM: CT HEAD WITHOUT CONTRAST TECHNIQUE: Contiguous axial images were obtained from the base of  the skull through the vertex without intravenous contrast. COMPARISON:  None. FINDINGS: Brain: Mild chronic small vessel ischemic changes within the deep periventricular white matter regions bilaterally. No mass, hemorrhage, edema or other evidence of acute parenchymal abnormality. No extra-axial hemorrhage. Vascular: Chronic calcified atherosclerotic changes of the  large vessels at the skull base. No unexpected hyperdense vessel. Skull: Normal. Negative for fracture or focal lesion. Sinuses/Orbits: No acute finding. Other: None. IMPRESSION: 1. No acute findings. No intracranial mass, hemorrhage or edema. 2. Mild chronic small vessel ischemic changes in the white matter. Electronically Signed   By: Franki Cabot M.D.   On: 05/20/2020 16:24   DG Chest Port 1 View  Result Date: 05/20/2020 CLINICAL DATA:  Lower extremity edema, increasing fatigue EXAM: PORTABLE CHEST 1 VIEW COMPARISON:  07/30/2017 FINDINGS: Single frontal view of the chest demonstrates an enlarged cardiac silhouette, with marked calcification of the mitral annulus. There is a moderate left pleural effusion, with underlying consolidation at the left lung base. Trace right pleural fluid is noted. Increased central vascular congestion. No pneumothorax. IMPRESSION: 1. Dense consolidation at the left lung base, with moderate left pleural effusion. 2. Trace right pleural fluid. 3. Increased central vascular congestion. 4. Enlarged cardiac silhouette, with dense calcification of the mitral annulus. 5. While the constellation of findings is most consistent with fluid overload, close follow-up is warranted to evaluate for resolution of the dense consolidation at the left lung base. Electronically Signed   By: Randa Ngo M.D.   On: 05/20/2020 15:46   ECHOCARDIOGRAM COMPLETE  Result Date: 05/21/2020    ECHOCARDIOGRAM REPORT   Patient Name:   Norma Smith Date of Exam: 05/21/2020 Medical Rec #:  [id number]     Height:       61.0 in Accession #:    [id number]    Weight:       151.4 lb Date of Birth:  20-Aug-1933      BSA:          1.678 m Patient Age:    44 years      BP:           147/87 mmHg Patient Gender: F             HR:           74 bpm. Exam Location:  Inpatient Procedure: 2D Echo, Cardiac Doppler and Color Doppler Indications:    I50.23 Acute on chronic systolic (congestive) heart failure  History:         Patient has prior history of Echocardiogram examinations, most                 recent 08/02/2016. Cardiomyopathy, Stroke and COPD, Aortic Valve                 Disease; Risk Factors:Hypertension, Diabetes and Dyslipidemia.                 [place].  Sonographer:    Jonelle Sidle Dance Referring Phys: [place]  1. Left ventricular ejection fraction, by estimation, is 60 to 65%. The left ventricle has normal function. The left ventricle has no regional wall motion abnormalities. There is moderate left ventricular hypertrophy. Left ventricular diastolic parameters are consistent with Grade II diastolic dysfunction (pseudonormalization).  2. Right ventricular systolic function is normal. The right ventricular size is normal.  3. Left atrial size was severely dilated.  4. The trivial pericardial effusion is posterior to the left ventricle. Moderate pleural effusion in the left  lateral region.  5. The mitral valve is abnormal. Trivial mitral valve regurgitation. Moderate to severe mitral annular calcification.  6. The aortic valve is trileaflet, however, the non-coronary cusp is calcified and restricted. Aortic valve regurgitation is trivial. Moderate aortic valve stenosis. Aortic valve area, by VTI measures 0.76 cm. Aortic valve mean gradient measures 28.0 mmHg. Aortic valve Vmax measures 3.81 m/s. Comparison(s): Prior images unable to be directly viewed, comparison made by report only. Changes from prior study are noted. 08/02/2016: LVEF 65-70%, grade 2 DD, mild AS -13 mmHg mean gradient. Conclusion(s)/Recommendation(s): Consider further aortic valve evaluation with TEE. FINDINGS  Left Ventricle: Left ventricular ejection fraction, by estimation, is 60 to 65%. The left ventricle has normal function. The left ventricle has no regional wall motion abnormalities. The left ventricular internal cavity size was normal in size. There is  moderate left ventricular hypertrophy. Left ventricular diastolic parameters  are consistent with Grade II diastolic dysfunction (pseudonormalization). Indeterminate filling pressures. Right Ventricle: The right ventricular size is normal. No increase in right ventricular wall thickness. Right ventricular systolic function is normal. Left Atrium: Left atrial size was severely dilated. Right Atrium: Right atrial size was normal in size. Pericardium: Trivial pericardial effusion is present. The pericardial effusion is posterior to the left ventricle. Mitral Valve: The mitral valve is abnormal. There is moderate thickening of the mitral valve leaflet(s). Moderate to severe mitral annular calcification. Trivial mitral valve regurgitation. Tricuspid Valve: The tricuspid valve is grossly normal. Tricuspid valve regurgitation is trivial. Aortic Valve: The non-coronary cusp appears calcified and resticted. The aortic valve is tricuspid. Aortic valve regurgitation is trivial. Moderate aortic stenosis is present. Aortic valve mean gradient measures 28.0 mmHg. Aortic valve peak gradient measures 58.1 mmHg. Aortic valve area, by VTI measures 0.76 cm. Pulmonic Valve: The pulmonic valve was grossly normal. Pulmonic valve regurgitation is mild. Aorta: The aortic root and ascending aorta are structurally normal, with no evidence of dilitation. IAS/Shunts: No atrial level shunt detected by color flow Doppler. Additional Comments: There is a moderate pleural effusion in the left lateral region.  LEFT VENTRICLE PLAX 2D LVIDd:         3.70 cm LVIDs:         2.50 cm LV PW:         1.60 cm LV IVS:        1.60 cm LVOT diam:     1.90 cm LV SV:         71 LV SV Index:   42 LVOT Area:     2.84 cm  RIGHT VENTRICLE             IVC RV Basal diam:  2.30 cm     IVC diam: 2.10 cm RV S prime:     12.10 cm/s TAPSE (M-mode): 2.0 cm LEFT ATRIUM              Index       RIGHT ATRIUM          Index LA diam:        4.40 cm  2.62 cm/m  RA Area:     9.60 cm LA Vol (A2C):   121.0 ml 72.11 ml/m RA Volume:   14.00 ml 8.34 ml/m LA  Vol (A4C):   102.0 ml 60.79 ml/m LA Biplane Vol: 114.0 ml 67.94 ml/m  AORTIC VALVE AV Area (Vmax):    1.12 cm AV Area (Vmean):   1.11 cm AV Area (VTI):     0.76 cm AV Vmax:  381.00 cm/s AV Vmean:          239.000 cm/s AV VTI:            0.940 m AV Peak Grad:      58.1 mmHg AV Mean Grad:      28.0 mmHg LVOT Vmax:         151.00 cm/s LVOT Vmean:        93.300 cm/s LVOT VTI:          0.251 m LVOT/AV VTI ratio: 0.27  AORTA Ao Root diam: 3.10 cm Ao Asc diam:  3.50 cm MITRAL VALVE MV Area (PHT): 2.24 cm     SHUNTS MV Decel Time: 338 msec     Systemic VTI:  0.25 m MV E velocity: 163.00 cm/s  Systemic Diam: 1.90 cm MV A velocity: 140.00 cm/s MV E/A ratio:  1.16 Lyman Bishop MD Electronically signed by Lyman Bishop MD Signature Date/Time: 05/21/2020/12:37:14 PM    Final      Assessment and Plan:   1. Acute on chronic diastolic CHF:  -patient with evidence of anasarca on exam.  -BNP elevated to 1151. CXR with evidence of dense consolidation at the LLL with moderate left pleural effusion and central vascular congestion.  -Echo showed EF 60-65%, G2DD, and moderate AS.  -She was started on IV lasix 65m BID (received 3 doses), however this was held given rise in Cr from 2.66>2.91 (baseline 2.11 on check 04/22/20, though was 1.0 in 2020).  -Albumin 04/22/20 was 2.8 which is likely contributing to her 3rd spacing -she has significant decreased BS in the left lung fields c/w her Cxray with consolidation and pleural effusion -recommend left lat decub to determine degree of layering of pleural effusion as may benefit from thoracentesis -I am not convinced that she is intravascularly volume overloaded especially with worsening renal function after diuretics. -EF is normal and only has moderate AS so I do not thing this is playing a role -she does have known chronic diastolic CHF but her breathing may be more related to 3rd spacing with pleural effusion in her lung -she was down for over 3 hours so ? If she  has a component of AKI from ?acute Rhabdo -recommend renal consult for further evaluation of worsening renal function  2. Elevated HsTrop:  -Trop 56>52  - low flat trend not c/w ACS.  -2D echo normal with no RWMAs -Suspect demand ischemia in the setting of moderate AS, HTN urgency on admission, continuation of ARB despite instructions to stop outpt and AKI  3. ? New onset atrial fibrillation: I have reviewed the EKGs and do not think that this is atrial fibrillation -her tele has shown all NSR with PACs and occasional 4 beats runs of WCT vs. PACs with aberration -consider outpt event monitor to assess further  4. HOCM: moderate LVH on echo this admission.   5. Aortic stenosis:  -moderate on echo this admission.  -Patient has not been interested in invasive measures in the past.  - -Continue routine monitoring  6. HTN:  -BP significantly elevated this admission.  -She is on home amlodipine and metoprolol succinate -BP today 204/769mg -increase amlodipine to 1039maily -increase Toprol to 98m40mily tomorrow if BP remains elevated  7. HLD:  -LDL markedly elevated to 240 on lipids 04/22/20. -Patient reported to have mistakenly stopped her atorvastatin rather than her irbesartan.  -Would restart atorvastatin 80mg61mly (previously on 20mg 15my) and repeat FLP and ALT in 6 weeks  8. AKI  on CKD stage IV:  -it appears there was a miscommunication recently in regards to recommendations to stop irbesartan.  -Patient instead stopped atorvastatin and continue irbesartan, likely adding to her progressive CKD. -recommend nephrology consult    Consider palliative care consult for [place] discussion  Risk Assessment/Risk Scores:   New York Heart Association (NYHA) Functional Class NYHA Class III  CHA2DS2-VASc Score = 9  This indicates a 12.2% annual risk of stroke. The patient's score is based upon: CHF History: Yes HTN History: Yes Diabetes History: Yes Stroke History:  Yes Vascular Disease History: Yes Age Score: 2 Gender Score: 1      For questions or updates, please contact [place] Please consult [email or web address] for contact info under    Signed, Abigail Butts, PA-C  05/22/2020 8:37 AM

## 2020-05-22 NOTE — Consult Note (Signed)
[facility]  INPATIENT CONSULTATION  Reason for Consultation: AKI Requesting Provider: Dr. Lonny Prude  HPI: Norma Smith is an 87 y.o. female with AS, remote CLL/MBUS (hasn't seen on since 2016), DM, HTN, HL, remote CVA without residual deficit, hypothyroidism currently admitted for new HFpEF, A fib with RVR and nephrology is consulted for evaluation and management of AKI.   Pt seen by PCP 4/4 for routine care - BP 140/90.  Had pre visit labs with TSH 11.5 so levothyroxine increased to 50.  Cr last check 01/2018 was 1.05 and was 2.11 on outpt labs.  Referred to nephrology by PCP 05/04/20 - nephrology rev'd referral and rec stop avapro which PCP communicated to her 05/04/20.   She then presented to [facility] 4/29 sent from PCP office with edema, dyspnea and weakness.  She had still been taking her ARB (had stopped atorvastatin mistakenly instead).  BP into 180s in ED.  CXR with L pleural effusion and pulmonary edema, in A fib (new dx).  TTE with EF 16-10%, grade 2 diastolic dysfcn, mod LVH, normal RV, mod AS.   She's been treated with BB, heparin gtt,  Lasix 40 IV BID which was stopped due to rising Cr.   Cr has trended 2.66 > 2.78 > 2.92. Na, K, corr Ca and Bicarb are all normal.  Hb 10.5, Plt 279. UOP documented yesterday at 1052mL, none doc day prior to today yet. UA 1.008, 3+ protein, 3+ blood with > 50RBC/hpf.  MACR in 09/2019 was 362.   Renal US 05/22/20 R 8.5cm, L 8.7cm, ^ echogenicity., small BL cysts (2 total).  Cardiology has consulted today - agree with holding diuretics as not clearly intravascular volume.  Checking decubitus film eval pleural effusion.   She tells me she has had 1 month of Nausea, early satiety, increasing weakness, dyspnea.  She has chronic constipation not recently different.  No new rashes, joint pains, hemoptysis, oral ulcers, eye erythema or pain (other than when she has monthly injections with optho).  She denies NSAID use.  No LUTs and no visible hematuria, tea  or cola colored urine.   She feels edema is about the same as admission.    PMH: Past Medical History:  Diagnosis Date  . Allergy   . Anemia   . Aortic stenosis   . Aortic stenosis, moderate 06/16/2011  . Blood transfusion without reported diagnosis 2010?  Marland Kitchen Cardiomyopathy ([place])   . CLL (chronic lymphoblastic leukemia) 12/01/2010  . Diabetes mellitus   . Dry skin dermatitis 12/01/2010  . Hyperlipidemia   . Hypertension   . Iron deficiency 12/01/2010  . Monoclonal B-cell lymphocytosis of unknown significance 12/02/2014  . Osteopenia   . Skin cancer   . Stroke [facility])    1999   PSH: Past Surgical History:  Procedure Laterality Date  . APPENDECTOMY    . BREAST BIOPSY  1976  . CATARACT EXTRACTION    . COLONOSCOPY WITH PROPOFOL Left 08/03/2016   Procedure: COLONOSCOPY WITH PROPOFOL;  Surgeon: Otis Brace, MD;  Location: [facility];  Service: Gastroenterology;  Laterality: Left;  . ESOPHAGOGASTRODUODENOSCOPY (EGD) WITH PROPOFOL Left 08/03/2016   Procedure: ESOPHAGOGASTRODUODENOSCOPY (EGD) WITH PROPOFOL;  Surgeon: Otis Brace, MD;  [facility];  Service: Gastroenterology;  Laterality: Left;  . LAPAROSCOPIC APPENDECTOMY N/A 12/13/2012   Procedure: APPENDECTOMY LAPAROSCOPIC;  Surgeon: Odis Hollingshead, MD;  Location: [facility];  Service: General;  Laterality: N/A;    Past Medical History:  Diagnosis Date  . Allergy   . Anemia   .  Aortic stenosis   . Aortic stenosis, moderate 06/16/2011  . Blood transfusion without reported diagnosis 2010?  Marland Kitchen Cardiomyopathy ([place])   . CLL (chronic lymphoblastic leukemia) 12/01/2010  . Diabetes mellitus   . Dry skin dermatitis 12/01/2010  . Hyperlipidemia   . Hypertension   . Iron deficiency 12/01/2010  . Monoclonal B-cell lymphocytosis of unknown significance 12/02/2014  . Osteopenia   . Skin cancer   . Stroke [facility])    1999    Medications:  I have reviewed the patient's current medications.  Medications Prior to Admission   Medication Sig Dispense Refill  . amLODipine (NORVASC) 5 MG tablet Take 1 tablet (5 mg total) by mouth daily. 90 tablet 3  . aspirin 81 MG tablet Take 1 tablet (81 mg total) by mouth daily. 30 tablet   . Cholecalciferol (D3 PO) Take 1 capsule by mouth daily.    . diazepam (VALIUM) 5 MG tablet 1/2 tab by mouth daily as needed (Patient taking differently: Take 2.5 mg by mouth daily.) 45 tablet 1  . Ferrous Sulfate (IRON) 325 (65 FE) MG TABS Take 325 mg by mouth 2 (two) times daily.    . furosemide (LASIX) 40 MG tablet Take 1 tablet (40 mg total) by mouth 2 (two) times daily. 180 tablet 3  . levothyroxine (SYNTHROID) 50 MCG tablet Take 1 tablet (50 mcg total) by mouth daily. 90 tablet 3  . metoprolol succinate (TOPROL-XL) 25 MG 24 hr tablet TAKE 1 tab by mouth once daily (Patient taking differently: Take 25 mg by mouth daily.) 90 tablet 3  . Multiple Vitamins-Minerals (CENTRUM SILVER PO) Take 1 tablet by mouth daily.    . Multiple Vitamins-Minerals (PRESERVISION AREDS 2 PO) Take 1 tablet by mouth 2 (two) times daily.    Ernestine Conrad 3-6-9 CAPS Take 1 capsule by mouth daily.    . potassium chloride (KLOR-CON) 10 MEQ tablet Take 1 tablet (10 mEq total) by mouth daily. 90 tablet 3  . VITAMIN D PO Take 1 tablet by mouth daily.    Marland Kitchen atorvastatin (LIPITOR) 20 MG tablet Take 1 tablet (20 mg total) by mouth daily. (Patient not taking: No sig reported) 90 tablet 3  . glucose blood test strip Used to test blood sugar 3 times daily 100 each 12    ALLERGIES:   Allergies  Allergen Reactions  . Codeine Other (See Comments)    Passed out  . Oxycodone Other (See Comments)    Visual Hallucinations  . Roxanol [Morphine] Hives and Rash  . Penicillins Rash    Has patient had a PCN reaction causing immediate rash, facial/tongue/throat swelling, SOB or lightheadedness with hypotension: Yes Has patient had a PCN reaction causing severe rash involving mucus membranes or skin necrosis: Yes Has patient had a PCN  reaction that required hospitalization: No Has patient had a PCN reaction occurring within the last 10 years: No If all of the above answers are "NO", then may proceed with Cephalosporin use.     FAM HX: Family History  Problem Relation Age of Onset  . Diabetes Mother   . Diabetes Other   . Cancer Other        breast cancer  . Heart disease Other   . Diabetes Other   . Cancer Maternal Aunt        breast  . Heart disease Maternal Aunt     Social History:   reports that she has quit smoking. She has never used smokeless tobacco. She reports that she does  not drink alcohol and does not use drugs.  ROS: 12 system ROS neg except per HPI above  Blood pressure (!) 163/84, pulse 86, temperature 97.6 F (36.4 C), temperature source Oral, resp. rate (!) 24, height 5\' 1"  (1.549 m), weight 69.2 kg, SpO2 95 %. PHYSICAL EXAM: Gen: elderly woman sitting comfortably in bed  Eyes: anicteric, EOMI, glasses  ENT: MMM Neck: supple no JVD noted at 30 degrees CV: irreg irreg, reg rate, III/VI SEM Abd:  Soft, nontender Lungs: dec BS to mid L lung with a few exp wheezes, normal WOB, no rales GU: no foley Extr:  2+ pitting LE edema and 1+ in LUE; PIV in L arm Neuro: grossly nonfocal Skin: no rashes   Results for orders placed or performed during the hospital encounter of 05/20/20 (from the past 48 hour(s))  CBC with Differential     Status: Abnormal   Collection Time: 05/20/20 12:55 PM  Result Value Ref Range   WBC 14.1 (H) 4.0 - 10.5 K/uL   RBC 3.60 (L) 3.87 - 5.11 MIL/uL   Hemoglobin 11.0 (L) 12.0 - 15.0 g/dL   HCT 35.2 (L) 36.0 - 46.0 %   MCV 97.8 80.0 - 100.0 fL   MCH 30.6 26.0 - 34.0 pg   MCHC 31.3 30.0 - 36.0 g/dL   RDW 13.6 11.5 - 15.5 %   Platelets 305 150 - 400 K/uL   nRBC 0.0 0.0 - 0.2 %   Neutrophils Relative % 71 %   Neutro Abs 10.0 (H) 1.7 - 7.7 K/uL   Lymphocytes Relative 21 %   Lymphs Abs 2.9 0.7 - 4.0 K/uL   Monocytes Relative 7 %   Monocytes Absolute 1.0 0.1 - 1.0  K/uL   Eosinophils Relative 1 %   Eosinophils Absolute 0.1 0.0 - 0.5 K/uL   Basophils Relative 0 %   Basophils Absolute 0.1 0.0 - 0.1 K/uL   Immature Granulocytes 0 %   Abs Immature Granulocytes 0.06 0.00 - 0.07 K/uL    Comment: Performed at [facility], [place] [address] [place], [place] [address]  Basic metabolic panel     Status: Abnormal   Collection Time: 05/20/20 12:55 PM  Result Value Ref Range   Sodium 142 135 - 145 mmol/L   Potassium 4.0 3.5 - 5.1 mmol/L   Chloride 108 98 - 111 mmol/L   CO2 26 22 - 32 mmol/L   Glucose, Bld 121 (H) 70 - 99 mg/dL    Comment: Glucose reference range applies only to samples taken after fasting for at least 8 hours.   BUN 41 (H) 8 - 23 mg/dL   Creatinine, Ser 2.66 (H) 0.44 - 1.00 mg/dL   Calcium 7.9 (L) 8.9 - 10.3 mg/dL   GFR, Estimated 17 (L) >60 mL/min    Comment: (NOTE) Calculated using the CKD-EPI Creatinine Equation (2021)    Anion gap 8 5 - 15    Comment: Performed at [facility], [place] [address], [place], [place] [address]  Brain natriuretic peptide     Status: Abnormal   Collection Time: 05/20/20 12:55 PM  Result Value Ref Range   B Natriuretic Peptide 1,151.9 (H) 0.0 - 100.0 pg/mL    Comment: Performed at [facility], [place] [address] [place], [place] [address]  TSH     Status: Abnormal   Collection Time: 05/20/20  1:56 PM  Result Value Ref Range   TSH 5.745 (H) 0.350 - 4.500 uIU/mL    Comment:  Performed by a 3rd Generation assay with a functional sensitivity of <=0.01 uIU/mL. Performed at [facility], [place] [address] [place], [place] [address]   APTT  (IF patient is taking Pradaxa)     Status: Abnormal   Collection Time: 05/20/20  2:00 PM  Result Value Ref Range   aPTT 37 (H) 24 - 36 seconds    Comment:        IF BASELINE aPTT IS ELEVATED, SUGGEST PATIENT RISK ASSESSMENT BE USED TO DETERMINE APPROPRIATE ANTICOAGULANT THERAPY. Performed at [facility], [place] [address], [place], [place] [address]   Magnesium     Status: None   Collection Time: 05/20/20  2:00 PM  Result Value Ref Range   Magnesium 1.7 1.7 - 2.4 mg/dL    Comment: Performed at [facility], [place] [address], [place], [place] [address]  Protime-INR (if pt is taking coumadin)     Status: None   Collection Time: 05/20/20  2:00 PM  Result Value Ref Range   Prothrombin Time 12.1 11.4 - 15.2 seconds   INR 0.9 0.8 - 1.2    Comment: (NOTE) INR goal varies based on device and disease states. Performed at [facility], [place] [address] [place], [place] [address]   Troponin I (High Sensitivity)     Status: Abnormal   Collection Time: 05/20/20  2:53 PM  Result Value Ref Range   Troponin I (High Sensitivity) 56 (H) <18 ng/L    Comment: (NOTE) Elevated high sensitivity troponin I (hsTnI) values and significant  changes across serial measurements may suggest ACS but many other  chronic and acute conditions are known to elevate hsTnI results.  Refer to the "Links" section for chest pain algorithms and additional  guidance. Performed at [facility], [place] [address], [place], [place] [address]   SARS CORONAVIRUS 2 (TAT 6-24 HRS) Nasopharyngeal Nasopharyngeal Swab     Status: None   Collection Time: 05/20/20  3:40 PM   Specimen: Nasopharyngeal Swab  Result Value Ref Range   SARS Coronavirus 2 NEGATIVE NEGATIVE    Comment: (NOTE) SARS-CoV-2 target nucleic acids are NOT DETECTED.  The SARS-CoV-2 RNA is generally detectable in upper and lower respiratory specimens during the acute phase of infection. Negative results do not preclude SARS-CoV-2 infection, do not rule out co-infections with other pathogens, and should not be used as the sole basis for treatment or other patient management decisions. Negative results must be combined with clinical observations, patient history, and epidemiological  information. The expected result is Negative.  Fact Sheet for Patients: SugarRoll.be  Fact Sheet for Healthcare Providers: https://www.woods-mathews.com/  This test is not yet approved or cleared by the [place] FDA and  has been authorized for detection and/or diagnosis of SARS-CoV-2 by FDA under an Emergency Use Authorization (EUA). This EUA will remain  in effect (meaning this test can be used) for the duration of the COVID-19 declaration under Se ction 564(b)(1) of the Act, 21 U.S.C. section 360bbb-3(b)(1), unless the authorization is terminated or revoked sooner.  Performed at [facility], [place] [address], [place], [place] [address]   Urinalysis, Routine w reflex microscopic     Status: Abnormal   Collection Time: 05/20/20  4:45 PM  Result Value Ref Range   Color, Urine YELLOW YELLOW   APPearance CLEAR CLEAR   Specific Gravity, Urine 1.008 1.005 - 1.030   pH 7.0 5.0 - 8.0   Glucose, UA 50 (A) NEGATIVE  mg/dL   Hgb urine dipstick MODERATE (A) NEGATIVE   Bilirubin Urine NEGATIVE NEGATIVE   Ketones, ur NEGATIVE NEGATIVE mg/dL   Protein, ur >=300 (A) NEGATIVE mg/dL   Nitrite NEGATIVE NEGATIVE   Leukocytes,Ua NEGATIVE NEGATIVE   RBC / HPF >50 (H) 0 - 5 RBC/hpf   WBC, UA 0-5 0 - 5 WBC/hpf   Bacteria, UA RARE (A) NONE SEEN   Squamous Epithelial / LPF 0-5 0 - 5   Mucus PRESENT    Hyaline Casts, UA PRESENT     Comment: Performed at [facility], [place] [address] [place], [place] [address]  Troponin I (High Sensitivity)     Status: Abnormal   Collection Time: 05/20/20  7:05 PM  Result Value Ref Range   Troponin I (High Sensitivity) 52 (H) <18 ng/L    Comment: (NOTE) Elevated high sensitivity troponin I (hsTnI) values and significant  changes across serial measurements may suggest ACS but many other  chronic and acute conditions are known to elevate hsTnI results.  Refer to the "Links" section for chest  pain algorithms and additional  guidance. Performed at [facility], [place] [address] [place], [place] [address]   T4, free     Status: None   Collection Time: 05/20/20  7:19 PM  Result Value Ref Range   Free T4 1.04 0.61 - 1.12 ng/dL    Comment: (NOTE) Biotin ingestion may interfere with free T4 tests. If the results are inconsistent with the TSH level, previous test results, or the clinical presentation, then consider biotin interference. If needed, order repeat testing after stopping biotin. Performed at [facility], [place] [address], [place], [place] [address]   Glucose, capillary     Status: Abnormal   Collection Time: 05/20/20  9:05 PM  Result Value Ref Range   Glucose-Capillary 115 (H) 70 - 99 mg/dL    Comment: Glucose reference range applies only to samples taken after fasting for at least 8 hours.  Heparin level (unfractionated)     Status: Abnormal   Collection Time: 05/21/20 12:08 AM  Result Value Ref Range   Heparin Unfractionated 0.16 (L) 0.30 - 0.70 IU/mL    Comment: (NOTE) The clinical reportable range upper limit is being lowered to >1.10 to align with the FDA approved guidance for the current laboratory assay.  If heparin results are below expected values, and patient dosage has  been confirmed, suggest follow up testing of antithrombin III levels. Performed at [facility], [place] [address], [place], [place] [address]   Basic metabolic panel     Status: Abnormal   Collection Time: 05/21/20 12:08 AM  Result Value Ref Range   Sodium 142 135 - 145 mmol/L   Potassium 4.3 3.5 - 5.1 mmol/L   Chloride 107 98 - 111 mmol/L   CO2 26 22 - 32 mmol/L   Glucose, Bld 131 (H) 70 - 99 mg/dL    Comment: Glucose reference range applies only to samples taken after fasting for at least 8 hours.   BUN 40 (H) 8 - 23 mg/dL   Creatinine, Ser 2.78 (H) 0.44 - 1.00 mg/dL   Calcium 7.8 (L) 8.9 - 10.3 mg/dL   GFR, Estimated 16 (L) >60 mL/min     Comment: (NOTE) Calculated using the CKD-EPI Creatinine Equation (2021)    Anion gap 9 5 - 15    Comment: Performed at [facility], [place] [address] [place], [place] [address]  CBC  Status: Abnormal   Collection Time: 05/21/20 12:08 AM  Result Value Ref Range   WBC 12.2 (H) 4.0 - 10.5 K/uL   RBC 3.40 (L) 3.87 - 5.11 MIL/uL   Hemoglobin 10.4 (L) 12.0 - 15.0 g/dL   HCT 33.7 (L) 36.0 - 46.0 %   MCV 99.1 80.0 - 100.0 fL   MCH 30.6 26.0 - 34.0 pg   MCHC 30.9 30.0 - 36.0 g/dL   RDW 13.5 11.5 - 15.5 %   Platelets 280 150 - 400 K/uL   nRBC 0.0 0.0 - 0.2 %    Comment: Performed at [facility], [place] [address] [place], [place] [address]  Glucose, capillary     Status: Abnormal   Collection Time: 05/21/20  8:11 AM  Result Value Ref Range   Glucose-Capillary 103 (H) 70 - 99 mg/dL    Comment: Glucose reference range applies only to samples taken after fasting for at least 8 hours.  Heparin level (unfractionated)     Status: None   Collection Time: 05/21/20  9:18 AM  Result Value Ref Range   Heparin Unfractionated 0.70 0.30 - 0.70 IU/mL    Comment: (NOTE) The clinical reportable range upper limit is being lowered to >1.10 to align with the FDA approved guidance for the current laboratory assay.  If heparin results are below expected values, and patient dosage has  been confirmed, suggest follow up testing of antithrombin III levels. Performed at [facility], [place] [address] [place], [place] [address]   Glucose, capillary     Status: Abnormal   Collection Time: 05/21/20 12:24 PM  Result Value Ref Range   Glucose-Capillary 122 (H) 70 - 99 mg/dL    Comment: Glucose reference range applies only to samples taken after fasting for at least 8 hours.  Glucose, capillary     Status: Abnormal   Collection Time: 05/21/20  4:53 PM  Result Value Ref Range   Glucose-Capillary 188 (H) 70 - 99 mg/dL    Comment: Glucose reference range  applies only to samples taken after fasting for at least 8 hours.  Heparin level (unfractionated)     Status: None   Collection Time: 05/21/20  6:36 PM  Result Value Ref Range   Heparin Unfractionated 0.34 0.30 - 0.70 IU/mL    Comment: (NOTE) The clinical reportable range upper limit is being lowered to >1.10 to align with the FDA approved guidance for the current laboratory assay.  If heparin results are below expected values, and patient dosage has  been confirmed, suggest follow up testing of antithrombin III levels. Performed at [facility], [place] [address] [place], [place] [address]   Glucose, capillary     Status: Abnormal   Collection Time: 05/21/20  9:46 PM  Result Value Ref Range   Glucose-Capillary 131 (H) 70 - 99 mg/dL    Comment: Glucose reference range applies only to samples taken after fasting for at least 8 hours.  Basic metabolic panel     Status: Abnormal   Collection Time: 05/22/20  4:34 AM  Result Value Ref Range   Sodium 143 135 - 145 mmol/L   Potassium 4.5 3.5 - 5.1 mmol/L   Chloride 108 98 - 111 mmol/L   CO2 25 22 - 32 mmol/L   Glucose, Bld 158 (H) 70 - 99 mg/dL    Comment: Glucose reference range applies only to samples taken after fasting for at least 8 hours.   BUN 45 (H) 8 -  23 mg/dL   Creatinine, Ser 2.92 (H) 0.44 - 1.00 mg/dL   Calcium 7.9 (L) 8.9 - 10.3 mg/dL   GFR, Estimated 15 (L) >60 mL/min    Comment: (NOTE) Calculated using the CKD-EPI Creatinine Equation (2021)    Anion gap 10 5 - 15    Comment: Performed at [facility], [place] [address] [place], [place] [address]  CBC     Status: Abnormal   Collection Time: 05/22/20  4:34 AM  Result Value Ref Range   WBC 13.5 (H) 4.0 - 10.5 K/uL   RBC 3.43 (L) 3.87 - 5.11 MIL/uL   Hemoglobin 10.5 (L) 12.0 - 15.0 g/dL   HCT 34.8 (L) 36.0 - 46.0 %   MCV 101.5 (H) 80.0 - 100.0 fL   MCH 30.6 26.0 - 34.0 pg   MCHC 30.2 30.0 - 36.0 g/dL   RDW 13.7 11.5 - 15.5 %    Platelets 279 150 - 400 K/uL   nRBC 0.0 0.0 - 0.2 %    Comment: Performed at [facility], [place] [address], [place], [place] [address]  Heparin level (unfractionated)     Status: Abnormal   Collection Time: 05/22/20  4:34 AM  Result Value Ref Range   Heparin Unfractionated 0.17 (L) 0.30 - 0.70 IU/mL    Comment: (NOTE) The clinical reportable range upper limit is being lowered to >1.10 to align with the FDA approved guidance for the current laboratory assay.  If heparin results are below expected values, and patient dosage has  been confirmed, suggest follow up testing of antithrombin III levels. Performed at [facility], [place] [address] [place], [place] [address]   Magnesium     Status: None   Collection Time: 05/22/20  4:34 AM  Result Value Ref Range   Magnesium 1.8 1.7 - 2.4 mg/dL    Comment: Performed at [facility], [place] [address], [place], [place] [address]  Glucose, capillary     Status: Abnormal   Collection Time: 05/22/20  7:21 AM  Result Value Ref Range   Glucose-Capillary 116 (H) 70 - 99 mg/dL    Comment: Glucose reference range applies only to samples taken after fasting for at least 8 hours.    CT Head Wo Contrast  Result Date: 05/20/2020 CLINICAL DATA:  Neuro deficit, fatigue, lower extremity swelling. EXAM: CT HEAD WITHOUT CONTRAST TECHNIQUE: Contiguous axial images were obtained from the base of the skull through the vertex without intravenous contrast. COMPARISON:  None. FINDINGS: Brain: Mild chronic small vessel ischemic changes within the deep periventricular white matter regions bilaterally. No mass, hemorrhage, edema or other evidence of acute parenchymal abnormality. No extra-axial hemorrhage. Vascular: Chronic calcified atherosclerotic changes of the large vessels at the skull base. No unexpected hyperdense vessel. Skull: Normal. Negative for fracture or focal lesion. Sinuses/Orbits: No acute finding.  Other: None. IMPRESSION: 1. No acute findings. No intracranial mass, hemorrhage or edema. 2. Mild chronic small vessel ischemic changes in the white matter. Electronically Signed   By: Franki Cabot M.D.   On: 05/20/2020 16:24   US RENAL  Result Date: 05/22/2020 CLINICAL DATA:  Acute renal insufficiency EXAM: RENAL / URINARY TRACT ULTRASOUND COMPLETE COMPARISON:  None. FINDINGS: Right Kidney: Renal measurements: 8.5 x 4.1 x 4.3 cm = volume: 89 mL. Contains a 1.9 cm cyst. No hydronephrosis. Mild increased cortical echogenicity. Left Kidney: Renal measurements: 8.7 x 4.0 x 3.9 cm = volume: 70 mL. Contains a 9 mm cyst. Mild increased  cortical echogenicity. Bladder: Appears normal for degree of bladder distention. Other: None. IMPRESSION: 1. Mild increased cortical echogenicity suggesting medical renal disease. Small bilateral renal cysts as above. Electronically Signed   By: Dorise Bullion III M.D   On: 05/22/2020 09:40   DG Chest Port 1 View  Result Date: 05/20/2020 CLINICAL DATA:  Lower extremity edema, increasing fatigue EXAM: PORTABLE CHEST 1 VIEW COMPARISON:  07/30/2017 FINDINGS: Single frontal view of the chest demonstrates an enlarged cardiac silhouette, with marked calcification of the mitral annulus. There is a moderate left pleural effusion, with underlying consolidation at the left lung base. Trace right pleural fluid is noted. Increased central vascular congestion. No pneumothorax. IMPRESSION: 1. Dense consolidation at the left lung base, with moderate left pleural effusion. 2. Trace right pleural fluid. 3. Increased central vascular congestion. 4. Enlarged cardiac silhouette, with dense calcification of the mitral annulus. 5. While the constellation of findings is most consistent with fluid overload, close follow-up is warranted to evaluate for resolution of the dense consolidation at the left lung base. Electronically Signed   By: Randa Ngo M.D.   On: 05/20/2020 15:46   ECHOCARDIOGRAM  COMPLETE  Result Date: 05/21/2020    ECHOCARDIOGRAM REPORT   Patient Name:   ILIANY LOSIER Date of Exam: 05/21/2020 Medical Rec #:  [id number]     Height:       61.0 in Accession #:    [id number]    Weight:       151.4 lb Date of Birth:  1933/09/28      BSA:          1.678 m Patient Age:    18 years      BP:           147/87 mmHg Patient Gender: F             HR:           74 bpm. Exam Location:  Inpatient Procedure: 2D Echo, Cardiac Doppler and Color Doppler Indications:    I50.23 Acute on chronic systolic (congestive) heart failure  History:        Patient has prior history of Echocardiogram examinations, most                 recent 08/02/2016. Cardiomyopathy, Stroke and COPD, Aortic Valve                 Disease; Risk Factors:Hypertension, Diabetes and Dyslipidemia.                 Canser.  Sonographer:    Jonelle Sidle Dance Referring Phys: [place]  1. Left ventricular ejection fraction, by estimation, is 60 to 65%. The left ventricle has normal function. The left ventricle has no regional wall motion abnormalities. There is moderate left ventricular hypertrophy. Left ventricular diastolic parameters are consistent with Grade II diastolic dysfunction (pseudonormalization).  2. Right ventricular systolic function is normal. The right ventricular size is normal.  3. Left atrial size was severely dilated.  4. The trivial pericardial effusion is posterior to the left ventricle. Moderate pleural effusion in the left lateral region.  5. The mitral valve is abnormal. Trivial mitral valve regurgitation. Moderate to severe mitral annular calcification.  6. The aortic valve is trileaflet, however, the non-coronary cusp is calcified and restricted. Aortic valve regurgitation is trivial. Moderate aortic valve stenosis. Aortic valve area, by VTI measures 0.76 cm. Aortic valve mean gradient measures 28.0 mmHg. Aortic valve Vmax measures 3.81 m/s. Comparison(s): Prior  images unable to be directly viewed,  comparison made by report only. Changes from prior study are noted. 08/02/2016: LVEF 65-70%, grade 2 DD, mild AS -13 mmHg mean gradient. Conclusion(s)/Recommendation(s): Consider further aortic valve evaluation with TEE. FINDINGS  Left Ventricle: Left ventricular ejection fraction, by estimation, is 60 to 65%. The left ventricle has normal function. The left ventricle has no regional wall motion abnormalities. The left ventricular internal cavity size was normal in size. There is  moderate left ventricular hypertrophy. Left ventricular diastolic parameters are consistent with Grade II diastolic dysfunction (pseudonormalization). Indeterminate filling pressures. Right Ventricle: The right ventricular size is normal. No increase in right ventricular wall thickness. Right ventricular systolic function is normal. Left Atrium: Left atrial size was severely dilated. Right Atrium: Right atrial size was normal in size. Pericardium: Trivial pericardial effusion is present. The pericardial effusion is posterior to the left ventricle. Mitral Valve: The mitral valve is abnormal. There is moderate thickening of the mitral valve leaflet(s). Moderate to severe mitral annular calcification. Trivial mitral valve regurgitation. Tricuspid Valve: The tricuspid valve is grossly normal. Tricuspid valve regurgitation is trivial. Aortic Valve: The non-coronary cusp appears calcified and resticted. The aortic valve is tricuspid. Aortic valve regurgitation is trivial. Moderate aortic stenosis is present. Aortic valve mean gradient measures 28.0 mmHg. Aortic valve peak gradient measures 58.1 mmHg. Aortic valve area, by VTI measures 0.76 cm. Pulmonic Valve: The pulmonic valve was grossly normal. Pulmonic valve regurgitation is mild. Aorta: The aortic root and ascending aorta are structurally normal, with no evidence of dilitation. IAS/Shunts: No atrial level shunt detected by color flow Doppler. Additional Comments: There is a moderate pleural  effusion in the left lateral region.  LEFT VENTRICLE PLAX 2D LVIDd:         3.70 cm LVIDs:         2.50 cm LV PW:         1.60 cm LV IVS:        1.60 cm LVOT diam:     1.90 cm LV SV:         71 LV SV Index:   42 LVOT Area:     2.84 cm  RIGHT VENTRICLE             IVC RV Basal diam:  2.30 cm     IVC diam: 2.10 cm RV S prime:     12.10 cm/s TAPSE (M-mode): 2.0 cm LEFT ATRIUM              Index       RIGHT ATRIUM          Index LA diam:        4.40 cm  2.62 cm/m  RA Area:     9.60 cm LA Vol (A2C):   121.0 ml 72.11 ml/m RA Volume:   14.00 ml 8.34 ml/m LA Vol (A4C):   102.0 ml 60.79 ml/m LA Biplane Vol: 114.0 ml 67.94 ml/m  AORTIC VALVE AV Area (Vmax):    1.12 cm AV Area (Vmean):   1.11 cm AV Area (VTI):     0.76 cm AV Vmax:           381.00 cm/s AV Vmean:          239.000 cm/s AV VTI:            0.940 m AV Peak Grad:      58.1 mmHg AV Mean Grad:      28.0 mmHg LVOT Vmax:  151.00 cm/s LVOT Vmean:        93.300 cm/s LVOT VTI:          0.251 m LVOT/AV VTI ratio: 0.27  AORTA Ao Root diam: 3.10 cm Ao Asc diam:  3.50 cm MITRAL VALVE MV Area (PHT): 2.24 cm     SHUNTS MV Decel Time: 338 msec     Systemic VTI:  0.25 m MV E velocity: 163.00 cm/s  Systemic Diam: 1.90 cm MV A velocity: 140.00 cm/s MV E/A ratio:  1.16 Lyman Bishop MD Electronically signed by Lyman Bishop MD Signature Date/Time: 05/21/2020/12:37:14 PM    Final     Assessment/Plan **AKI, Nonoliguric:  Last known Cr 1 in 2020 and was 2.1 in early 04/2020 now trending up after diuresis to 2.9 today.   Imaging w/o obstruction but consistent with CKD.  UA with protein and blood.  Check UP/C, basic serologic w/u given proteinuria and increasing hematuria.   PO intake seems ok - hold on IVF and diuretics today.  Strict I/Os, daily weights.  Cont to hold RAAS blockers.  Avoid nephrotoxins as able.  Avoid hypotension.   **HTN:  On toprol and amlodipine just increased to 10mg  today.  Hold home ARB in light of AKI.  **HFpEF:  Grade 2 diastolic  dysfunction noted.  Cardiology is consulted.   They agree hold on diuresis today.   **Pleural effusion:  Agree with further eval.    **Anemia:  H/o iron deficiency, on po iron.  Hb in the 10s.  Recheck iron indices.  Check SPEP, SFLC.  **h/o CLL/MBUS:  Formerly followed with oncology.  An outpt f/u should be considered.  **Hematuria:  W/u for GN per above.  Could consult with urology outpt.   Justin Mend 05/22/2020, 10:48 AM

## 2020-05-22 NOTE — Progress Notes (Signed)
Pharmacy Brief Note - Anticoagulation Follow Up:  Patient is on heparin drip for atrial fibrillation. For full history, see note by Shela Commons, PharmD from this morning.   Assessment:  HL = 0.35 is therapeutic on heparin infusion of 950 units/hr  Confirmed with RN that heparin infusing at correct rate. No signs of bleeding.   Goal: HL 0.3 - 0.7  Plan:  Continue heparin infusion at current rate of 950 units/hr  Daily HL & CBC while on heparin   Monitor for signs of bleeding  Netta Cedars, PharmD 05/22/20 10:41 PM

## 2020-05-22 NOTE — Progress Notes (Signed)
Pharmacy Brief Note - Anticoagulation Afternoon Follow Up:  Patient is on heparin drip for atrial fibrillation. For full history, see note by Shela Commons, PharmD from this morning.   Assessment:  HL = 0.31 is therapeutic on heparin infusion of 950 units/hr  Confirmed with RN that heparin infusing at correct rate. No signs of bleeding.   Goal: HL 0.3 - 0.7  Plan:  Continue heparin infusion at current rate of 950 units/hr  Check confirmatory 6 hour HL   Monitor for signs of bleeding  Lenis Noon, PharmD 05/22/20 5:46 PM

## 2020-05-22 NOTE — Progress Notes (Signed)
[place] for IV heparin Indication: atrial fibrillation  Allergies  Allergen Reactions  . Codeine Other (See Comments)    Passed out  . Oxycodone Other (See Comments)    Visual Hallucinations  . Roxanol [Morphine] Hives and Rash  . Penicillins Rash    Has patient had a PCN reaction causing immediate rash, facial/tongue/throat swelling, SOB or lightheadedness with hypotension: Yes Has patient had a PCN reaction causing severe rash involving mucus membranes or skin necrosis: Yes Has patient had a PCN reaction that required hospitalization: No Has patient had a PCN reaction occurring within the last 10 years: No If all of the above answers are "NO", then may proceed with Cephalosporin use.     Patient Measurements: Height: 5\' 1"  (154.9 cm) Weight: 69.2 kg (152 lb 8.9 oz) IBW/kg (Calculated) : 47.8 Heparin Dosing Weight: 62 kg (most recent wt 68.7 kg on 4/4)  Vital Signs: Temp: 97.6 F (36.4 C) (05/01 0532) Temp Source: Oral (05/01 0532) BP: 160/81 (05/01 0532) Pulse Rate: 85 (05/01 0532)  Labs: Recent Labs    05/20/20 1255 05/20/20 1255 05/20/20 1400 05/20/20 1453 05/20/20 1905 05/21/20 0008 05/21/20 0918 05/21/20 1836 05/22/20 0434  HGB 11.0*  --   --   --   --  10.4*  --   --  10.5*  HCT 35.2*  --   --   --   --  33.7*  --   --  34.8*  PLT 305  --   --   --   --  280  --   --  279  APTT  --   --  37*  --   --   --   --   --   --   LABPROT  --   --  12.1  --   --   --   --   --   --   INR  --   --  0.9  --   --   --   --   --   --   HEPARINUNFRC  --    < >  --   --   --  0.16* 0.70 0.34 0.17*  CREATININE 2.66*  --   --   --   --  2.78*  --   --  2.92*  TROPONINIHS  --   --   --  56* 52*  --   --   --   --    < > = values in this interval not displayed.    Estimated Creatinine Clearance: 12.8 mL/min (A) (by C-G formula based on SCr of 2.92 mg/dL (H)).   Medical History: Past Medical History:  Diagnosis Date  . Allergy    . Anemia   . Aortic stenosis   . Aortic stenosis, moderate 06/16/2011  . Blood transfusion without reported diagnosis 2010?  Marland Kitchen Cardiomyopathy ([place])   . CLL (chronic lymphoblastic leukemia) 12/01/2010  . Diabetes mellitus   . Dry skin dermatitis 12/01/2010  . Hyperlipidemia   . Hypertension   . Iron deficiency 12/01/2010  . Monoclonal B-cell lymphocytosis of unknown significance 12/02/2014  . Osteopenia   . Skin cancer   . Stroke [facility])    1999    Medications:  Scheduled:  . amLODipine  5 mg Oral Daily  . aspirin EC  81 mg Oral Daily  . diazepam  2.5 mg Oral Daily  . ferrous sulfate  325 mg Oral BID  . hydrocerin  Topical BID  . insulin aspart  0-9 Units Subcutaneous TID WC  . levothyroxine  37.5 mcg Oral Q0600  . metoprolol succinate  25 mg Oral Daily  . potassium chloride  20 mEq Oral BID  . sodium chloride flush  3 mL Intravenous Q12H   Infusions:  . sodium chloride    . heparin 950 Units/hr (05/22/20 0736)    Assessment: 87 yo female with new onset Afib to start IV heparin per pharmacy dosing.  CHA2DS2-VASc Scoreis 6, but history of GI Bleed and recent fall. Baseline CBC- Hg slightly low at 11, pltc WNL. Aptt, PT/INR WNL.   05/22/2020:  HL = 0.17 is subtherapeutic on heparin infusion of 900 units/hr  Confirmed with RN that heparin infusing at correct rate and no interruption in infusion during night. No signs of bleeding.   CBC: Hgb 10.5 - slight drop; Plt WNL/stable  Scr elevated at 2.92  Goal of Therapy:  Heparin level 0.3-0.7 units/ml Monitor platelets by anticoagulation protocol: Yes   Plan:   Increase heparin infusion to 950 units/hr  Check 8 hour HL  Daily CBC and heparin level while on heparin  F/U long-term anticoagulation plans  Shaneeka Scarboro P. Legrand Como, PharmD, [place] Please utilize Amion for appropriate phone number to reach the unit pharmacist ([place]) 05/22/2020 8:37 AM

## 2020-05-23 ENCOUNTER — Inpatient Hospital Stay (HOSPITAL_COMMUNITY): Payer: Medicare HMO

## 2020-05-23 ENCOUNTER — Other Ambulatory Visit: Payer: Self-pay | Admitting: Student

## 2020-05-23 DIAGNOSIS — I350 Nonrheumatic aortic (valve) stenosis: Secondary | ICD-10-CM

## 2020-05-23 DIAGNOSIS — I4891 Unspecified atrial fibrillation: Secondary | ICD-10-CM

## 2020-05-23 DIAGNOSIS — I10 Essential (primary) hypertension: Secondary | ICD-10-CM | POA: Diagnosis not present

## 2020-05-23 DIAGNOSIS — I421 Obstructive hypertrophic cardiomyopathy: Secondary | ICD-10-CM | POA: Diagnosis not present

## 2020-05-23 DIAGNOSIS — N179 Acute kidney failure, unspecified: Secondary | ICD-10-CM

## 2020-05-23 DIAGNOSIS — E7800 Pure hypercholesterolemia, unspecified: Secondary | ICD-10-CM | POA: Diagnosis not present

## 2020-05-23 DIAGNOSIS — R601 Generalized edema: Secondary | ICD-10-CM

## 2020-05-23 LAB — HEPATITIS C ANTIBODY: HCV Ab: NONREACTIVE

## 2020-05-23 LAB — CBC
HCT: 32.7 % — ABNORMAL LOW (ref 36.0–46.0)
Hemoglobin: 9.9 g/dL — ABNORMAL LOW (ref 12.0–15.0)
MCH: 30.6 pg (ref 26.0–34.0)
MCHC: 30.3 g/dL (ref 30.0–36.0)
MCV: 100.9 fL — ABNORMAL HIGH (ref 80.0–100.0)
Platelets: 292 10*3/uL (ref 150–400)
RBC: 3.24 MIL/uL — ABNORMAL LOW (ref 3.87–5.11)
RDW: 13.6 % (ref 11.5–15.5)
WBC: 13.2 10*3/uL — ABNORMAL HIGH (ref 4.0–10.5)
nRBC: 0 % (ref 0.0–0.2)

## 2020-05-23 LAB — LACTATE DEHYDROGENASE, PLEURAL OR PERITONEAL FLUID: LD, Fluid: 60 U/L — ABNORMAL HIGH (ref 3–23)

## 2020-05-23 LAB — HEPARIN LEVEL (UNFRACTIONATED): Heparin Unfractionated: 0.43 IU/mL (ref 0.30–0.70)

## 2020-05-23 LAB — BASIC METABOLIC PANEL
Anion gap: 8 (ref 5–15)
BUN: 48 mg/dL — ABNORMAL HIGH (ref 8–23)
CO2: 25 mmol/L (ref 22–32)
Calcium: 8 mg/dL — ABNORMAL LOW (ref 8.9–10.3)
Chloride: 111 mmol/L (ref 98–111)
Creatinine, Ser: 2.98 mg/dL — ABNORMAL HIGH (ref 0.44–1.00)
GFR, Estimated: 15 mL/min — ABNORMAL LOW (ref >60)
Glucose, Bld: 147 mg/dL — ABNORMAL HIGH (ref 70–99)
Potassium: 4.7 mmol/L (ref 3.5–5.1)
Sodium: 144 mmol/L (ref 135–145)

## 2020-05-23 LAB — BODY FLUID CELL COUNT WITH DIFFERENTIAL
Lymphs, Fluid: 61 %
Monocyte-Macrophage-Serous Fluid: 21 % — ABNORMAL LOW (ref 50–90)
Neutrophil Count, Fluid: 18 % (ref 0–25)
Total Nucleated Cell Count, Fluid: 579 cu mm (ref 0–1000)

## 2020-05-23 LAB — HEPATITIS B SURFACE ANTIGEN: Hepatitis B Surface Ag: NONREACTIVE

## 2020-05-23 LAB — PROTEIN / CREATININE RATIO, URINE
Creatinine, Urine: 85.12 mg/dL
Protein Creatinine Ratio: 10.94 mg/mg{Cre} — ABNORMAL HIGH (ref 0.00–0.15)
Total Protein, Urine: 931 mg/dL

## 2020-05-23 LAB — PROTEIN, PLEURAL OR PERITONEAL FLUID: Total protein, fluid: <3 g/dL

## 2020-05-23 LAB — PROTEIN, TOTAL: Total Protein: 5 g/dL — ABNORMAL LOW (ref 6.5–8.1)

## 2020-05-23 LAB — GLUCOSE, CAPILLARY
Glucose-Capillary: 115 mg/dL — ABNORMAL HIGH (ref 70–99)
Glucose-Capillary: 136 mg/dL — ABNORMAL HIGH (ref 70–99)
Glucose-Capillary: 190 mg/dL — ABNORMAL HIGH (ref 70–99)

## 2020-05-23 LAB — IRON AND TIBC
Iron: 82 ug/dL (ref 28–170)
Saturation Ratios: 56 % — ABNORMAL HIGH (ref 10.4–31.8)
TIBC: 146 ug/dL — ABNORMAL LOW (ref 250–450)
UIBC: 64 ug/dL

## 2020-05-23 LAB — MAGNESIUM: Magnesium: 1.7 mg/dL (ref 1.7–2.4)

## 2020-05-23 LAB — POTASSIUM: Potassium: 5.1 mmol/L (ref 3.5–5.1)

## 2020-05-23 LAB — FERRITIN: Ferritin: 195 ng/mL (ref 11–307)

## 2020-05-23 LAB — PHOSPHORUS: Phosphorus: 4.2 mg/dL (ref 2.5–4.6)

## 2020-05-23 LAB — LACTATE DEHYDROGENASE: LDH: 369 U/L — ABNORMAL HIGH (ref 98–192)

## 2020-05-23 LAB — ALBUMIN: Albumin: 1.8 g/dL — ABNORMAL LOW (ref 3.5–5.0)

## 2020-05-23 MED ORDER — SODIUM CHLORIDE 0.9 % IV SOLN
1.0000 g | Freq: Every day | INTRAVENOUS | Status: DC
  Administered 2020-05-23 – 2020-05-24 (×2): 1 g via INTRAVENOUS
  Filled 2020-05-23: qty 10
  Filled 2020-05-23 (×2): qty 1

## 2020-05-23 MED ORDER — CHLORHEXIDINE GLUCONATE CLOTH 2 % EX PADS
6.0000 | MEDICATED_PAD | Freq: Every day | CUTANEOUS | Status: DC
  Administered 2020-05-24 – 2020-05-26 (×4): 6 via TOPICAL

## 2020-05-23 MED ORDER — MAGNESIUM SULFATE 2 GM/50ML IV SOLN
2.0000 g | Freq: Once | INTRAVENOUS | Status: AC
  Administered 2020-05-24: 2 g via INTRAVENOUS
  Filled 2020-05-23: qty 50

## 2020-05-23 MED ORDER — HYDROMORPHONE HCL 1 MG/ML IJ SOLN
0.5000 mg | INTRAMUSCULAR | Status: DC | PRN
  Administered 2020-05-23 – 2020-05-24 (×2): 0.5 mg via INTRAVENOUS
  Filled 2020-05-23 (×2): qty 0.5

## 2020-05-23 MED ORDER — SODIUM CHLORIDE 0.9 % IV SOLN
500.0000 mg | Freq: Every day | INTRAVENOUS | Status: DC
  Administered 2020-05-23: 500 mg via INTRAVENOUS
  Filled 2020-05-23 (×2): qty 500

## 2020-05-23 MED ORDER — LIDOCAINE HCL 1 % IJ SOLN
INTRAMUSCULAR | Status: AC
  Filled 2020-05-23: qty 20

## 2020-05-23 MED ORDER — HYDRALAZINE HCL 25 MG PO TABS
25.0000 mg | ORAL_TABLET | Freq: Three times a day (TID) | ORAL | Status: DC
  Administered 2020-05-23 – 2020-05-25 (×6): 25 mg via ORAL
  Filled 2020-05-23 (×6): qty 1

## 2020-05-23 MED ORDER — HEPARIN SODIUM (PORCINE) 5000 UNIT/ML IJ SOLN
5000.0000 [IU] | Freq: Three times a day (TID) | INTRAMUSCULAR | Status: DC
  Administered 2020-05-23 – 2020-05-25 (×6): 5000 [IU] via SUBCUTANEOUS
  Filled 2020-05-23 (×6): qty 1

## 2020-05-23 NOTE — Progress Notes (Signed)
Physical Therapy Treatment Patient Details Name: Norma Smith MRN: [id number] DOB: Dec 30, 1933 Today's Date: 05/23/2020    History of Present Illness Norma Smith is a 87 y.o. female with medical history significant of iron deficiency anemia, aortic stenosis, hypertrophic cardiomyopathy, anxiety, CKD stage IV, diabetes mellitus, hyperlipidemia, hypertension, macular degeneration, hypothyroidism. Patient presents with worsening dyspnea over the past few weeks and found to be in A-fib. Pt also suffered a fall when exiting her shower on 05/19/2020.    PT Comments    Patient ambulated x 70', sat and rested then 40'. Patient progressing. Requires RW. SPO2 100%, HR 81. Continue PT for progressive mobility.  Follow Up Recommendations  SNF     Equipment Recommendations  None recommended by PT    Recommendations for Other Services       Precautions / Restrictions Precautions Precautions: Fall Precaution Comments: monitor sats/HR    Mobility  Bed Mobility Overal bed mobility: Needs Assistance Bed Mobility: Sit to Supine       Sit to supine: Min assist   General bed mobility comments: assist legs onto bed    Transfers Overall transfer level: Needs assistance Equipment used: 4-wheeled walker Transfers: Sit to/from Stand Sit to Stand: Supervision Stand pivot transfers: Min guard       General transfer comment: stands from recliner  with min guard. Able to turn and sit on rollator to  rest. Min assist and cues for safety as patient attempted too stand while rotated in the seat.Cues to  turn facing straight   then stand  up and turn back to hold rollator to amb  Ambulation/Gait Ambulation/Gait assistance: Min guard Gait Distance (Feet): 70 Feet (then 40') Assistive device: 4-wheeled walker Gait Pattern/deviations: Step-through pattern Gait velocity: decr   General Gait Details: gait steady with RW. SPO2 100% after first 70'. HR 81   Stairs             Wheelchair  Mobility    Modified Rankin (Stroke Patients Only)       Balance Overall balance assessment: Needs assistance Sitting-balance support: Feet supported       Standing balance support: Bilateral upper extremity supported Standing balance-Leahy Scale: Fair Standing balance comment: Fair static , stood up without RW                            Cognition   Behavior During Therapy: WFL for tasks assessed/performed Overall Cognitive Status: Within Functional Limits for tasks assessed                                 General Comments: states she is a little down about going to rehab due to her neighbors have gone and not come back.      Exercises      General Comments        Pertinent Vitals/Pain Pain Assessment: No/denies pain    Home Living                      Prior Function            PT Goals (current goals can now be found in the care plan section) Progress towards PT goals: Progressing toward goals    Frequency    Min 2X/week      PT Plan Current plan remains appropriate;Frequency needs to be updated    Co-evaluation  AM-PAC PT "6 Clicks" Mobility   Outcome Measure    Help needed moving from lying on your back to sitting on the side of a flat bed without using bedrails?: A Little Help needed moving to and from a bed to a chair (including a wheelchair)?: A Little Help needed standing up from a chair using your arms (e.g., wheelchair or bedside chair)?: A Little Help needed to walk in hospital room?: A Little Help needed climbing 3-5 steps with a railing? : A Lot 6 Click Score: 14    End of Session Equipment Utilized During Treatment: Gait belt;Oxygen Activity Tolerance: Patient limited by fatigue;Patient tolerated treatment well Patient left: in bed;with call bell/phone within reach;with bed alarm set Nurse Communication: Mobility status PT Visit Diagnosis: Unsteadiness on feet (R26.81);Difficulty  in walking, not elsewhere classified (R26.2)     Time: 0626-9485 PT Time Calculation (min) (ACUTE ONLY): 10 min  Charges:                        Tresa Endo PT Acute Rehabilitation Services Pager [phone] Office [phone]    Claretha Cooper 05/23/2020, 1:56 PM

## 2020-05-23 NOTE — Progress Notes (Signed)
PROGRESS NOTE    Jourdyn Hasler  FYB:[id number] DOB: September 17, 1933 DOA: 05/20/2020 PCP: Hoyt Koch, MD   Brief Narrative: Ellaree Gear is a 87 y.o. female with medical history significant of iron deficiency anemia, aortic stenosis, hypertrophic cardiomyopathy, anxiety, CKD stage IV, diabetes mellitus, hyperlipidemia, hypertension, macular degeneration, hypothyroidism. Patient presented secondary to shortness of breath and found to have evidence of new heart failure. IV lasix started.   Assessment & Plan:   Active Problems:   Diabetes ([place])   Hyperlipidemia   Hypertension   Hypertrophic obstructive cardiomyopathy (HCC)   Depression   Anxiety   Monoclonal B-cell lymphocytosis of unknown significance   Asymptomatic microscopic hematuria   CKD (chronic kidney disease) stage 4, GFR 15-29 ml/min (HCC)   Acquired hypothyroidism   Macular degeneration of both eyes   Acute CHF ([place])   Acute diastolic heart failure New diagnosis. Patient with a history of hypertrophic cardiomyopathy/aortic stenosis. BNP of 1151.9. Anasarca. Initial concern for atrial fibrillation which may instead be PACs per cardiology. Currently not in RVR. -Daily BMP -No ACEi/ARB secondary to advanced CKD. -Continue Toprol XL -Daily weights/strict in and out -Cardiology recommendations: unlikely etiology for current presentation  Arrhythmia Initial concern for atrial fibrillation. Cardiology assessment is PACs rather than atrial fibrillation. CHA2DS2-VASc Scoreis 6.  Plan drip started in the ED.  Patient is on Toprol-XL and aspirin as an outpatient. Symptoms of palpitation seem to correlate with recent increase of Synthroid from 25 mcg to 50 mcg. Transthoracic Echocardiogram significant for left atrial enlargement. -Continue Toprol XL -Discontinue heparin drip -Cardiology recommending event monitor as an outpatient  Pleural effusion Moderate left side consistent with fluid overload picture from acute  CHF. Left lateral decubitus x-ray suggests free flowing fluid. Since low concern for CHF, will need to consider other etiologies. Mild leukocytosis but patient does have a dry cough and some pain. Afebrile. -Thoracentesis -LDH, protein  Hypertrophic cardiomyopathy Aortic stenosis Patient was previously following with cardiology as an outpatient. Moderate stenosis on most recent Transthoracic Echocardiogram.  Hypertensive urgency Improved with home medications. Orthostatic vitals negative -Continue metoprolol -Cardiology recommendations: discontinue amlodipine, start hydralazine  AKI on CKD stage IV Baseline creatine is about 2.11 from 04/22/20. Previously, baseline of 1.0-1.1 from 2020. Likely acute injury from heart failure. Slightly up today. -Nephrology recommendations: ANCA antibodies, GBM ab, C3/4 complement, ANA w/ reflex, ASO titer, kappa/lambda light chains -Daily BMP  Microscopic hematuria Chronic issue. Per family, unsure if this was ever worked up. Hematuria has been present for years. Nephrology workup as above  Anasarca In setting of heart failure vs renal disease. Complicated by low albumin as well. Management above.  Diabetes mellitus, type II Last hemoglobin A1C of 6.3%. -SSI while inpatient  Hypothyroidism Patient previously on Synthroid 25 mcg which was increased to Synthroid 50 mcg on 04/25/2020 for TSH of 11.5. TSH on admission of 5.745 -Continue Synthroid 37.5 mcg daily  Anxiety -Continue Valium  Monoclonal B-cell lymphocytosis of unknown significance Patient previously was followed by oncology.  Currently not on any management.  History of GI bleeding At that time in 2018 but no definitive etiology found.  Patient with a history of diverticulosis, hemorrhoids, gastritis.  Iron deficiency anemia Continue iron supplementation   DVT prophylaxis: Heparin subq Code Status:   Code Status: DNR Family Communication: Daughter on telephone Disposition  Plan: Discharge to ILF vs SNF pending functional status improvement, cardiology recommendations.   Consultants:   Cardiology  Nephrology  Procedures:   TRANSTHORACIC ECHOCARDIOGRAM (05/21/2020) IMPRESSIONS  1. Left ventricular ejection fraction, by estimation, is 60 to 65%. The  left ventricle has normal function. The left ventricle has no regional  wall motion abnormalities. There is moderate left ventricular hypertrophy.  Left ventricular diastolic  parameters are consistent with Grade II diastolic dysfunction  (pseudonormalization).  2. Right ventricular systolic function is normal. The right ventricular  size is normal.  3. Left atrial size was severely dilated.  4. The trivial pericardial effusion is posterior to the left ventricle.  Moderate pleural effusion in the left lateral region.  5. The mitral valve is abnormal. Trivial mitral valve regurgitation.  Moderate to severe mitral annular calcification.  6. The aortic valve is trileaflet, however, the non-coronary cusp is  calcified and restricted. Aortic valve regurgitation is trivial. Moderate  aortic valve stenosis. Aortic valve area, by VTI measures 0.76 cm. Aortic  valve mean gradient measures 28.0  mmHg. Aortic valve Vmax measures 3.81 m/s.   Comparison(s): Prior images unable to be directly viewed, comparison made  by report only. Changes from prior study are noted. 08/02/2016: LVEF  65-70%, grade 2 DD, mild AS -13 mmHg mean gradient.   Conclusion(s)/Recommendation(s): Consider further aortic valve evaluation  with TEE.   Antimicrobials:  None    Subjective: Still with some dyspnea. No chest pain.  Objective: Vitals:   05/22/20 1341 05/22/20 1853 05/22/20 2104 05/23/20 0520  BP: (!) 186/59 (!) 155/54 (!) 153/59 (!) 174/67  Pulse: 65 63 70 82  Resp: 20 19 (!) 21 (!) 24  Temp: 97.7 F (36.5 C) 97.9 F (36.6 C) 98.2 F (36.8 C) (!) 97.5 F (36.4 C)  TempSrc: Oral Oral Oral Oral  SpO2: 96%  100% 98% 98%  Weight:      Height:        Intake/Output Summary (Last 24 hours) at 05/23/2020 0843 Last data filed at 05/23/2020 0700 Gross per 24 hour  Intake 1068.42 ml  Output 400 ml  Net 668.42 ml   Filed Weights   05/21/20 1352 05/22/20 0500  Weight: 68.6 kg 69.2 kg    Examination:  General exam: Appears calm and comfortable Respiratory system: Clear to auscultation with diminished breath sounds on LL spaces. Respiratory effort normal. Cardiovascular system: S1 & S2 heard, RRR. 2/6 systolic murmur Gastrointestinal system: Abdomen is nondistended, soft and nontender. No organomegaly or masses felt. Normal bowel sounds heard. Central nervous system: Alert and oriented. No focal neurological deficits. Musculoskeletal: Diffuse edema. No calf tenderness Skin: No cyanosis. No rashes Psychiatry: Judgement and insight appear normal. Mood & affect appropriate.   Data Reviewed: I have personally reviewed following labs and imaging studies  CBC Lab Results  Component Value Date   WBC 13.2 (H) 05/23/2020   RBC 3.24 (L) 05/23/2020   HGB 9.9 (L) 05/23/2020   HCT 32.7 (L) 05/23/2020   MCV 100.9 (H) 05/23/2020   MCH 30.6 05/23/2020   PLT 292 05/23/2020   MCHC 30.3 05/23/2020   RDW 13.6 05/23/2020   LYMPHSABS 2.9 05/20/2020   MONOABS 1.0 05/20/2020   EOSABS 0.1 05/20/2020   BASOSABS 0.1 57/32/2025     Last metabolic panel Lab Results  Component Value Date   NA 144 05/23/2020   K 4.7 05/23/2020   CL 111 05/23/2020   CO2 25 05/23/2020   BUN 48 (H) 05/23/2020   CREATININE 2.98 (H) 05/23/2020   GLUCOSE 147 (H) 05/23/2020   GFRNONAA 15 (L) 05/23/2020   GFRAA 52 (L) 08/04/2016   CALCIUM 8.0 (L) 05/23/2020   PHOS 4.2  05/23/2020   PROT 5.9 (L) 04/22/2020   ALBUMIN 2.8 (L) 04/22/2020   BILITOT 0.4 04/22/2020   ALKPHOS 64 04/22/2020   AST 22 04/22/2020   ALT 14 04/22/2020   ANIONGAP 8 05/23/2020    CBG (last 3)  Recent Labs    05/22/20 1629 05/22/20 2019  05/23/20 0742  GLUCAP 149* 159* 115*     GFR: Estimated Creatinine Clearance: 12.5 mL/min (A) (by C-G formula based on SCr of 2.98 mg/dL (H)).  Coagulation Profile: Recent Labs  Lab 05/20/20 1400  INR 0.9    Recent Results (from the past 240 hour(s))  SARS CORONAVIRUS 2 (TAT 6-24 HRS) Nasopharyngeal Nasopharyngeal Swab     Status: None   Collection Time: 05/20/20  3:40 PM   Specimen: Nasopharyngeal Swab  Result Value Ref Range Status   SARS Coronavirus 2 NEGATIVE NEGATIVE Final    Comment: (NOTE) SARS-CoV-2 target nucleic acids are NOT DETECTED.  The SARS-CoV-2 RNA is generally detectable in upper and lower respiratory specimens during the acute phase of infection. Negative results do not preclude SARS-CoV-2 infection, do not rule out co-infections with other pathogens, and should not be used as the sole basis for treatment or other patient management decisions. Negative results must be combined with clinical observations, patient history, and epidemiological information. The expected result is Negative.  Fact Sheet for Patients: SugarRoll.be  Fact Sheet for Healthcare Providers: https://www.woods-mathews.com/  This test is not yet approved or cleared by the [place] FDA and  has been authorized for detection and/or diagnosis of SARS-CoV-2 by FDA under an Emergency Use Authorization (EUA). This EUA will remain  in effect (meaning this test can be used) for the duration of the COVID-19 declaration under Se ction 564(b)(1) of the Act, 21 U.S.C. section 360bbb-3(b)(1), unless the authorization is terminated or revoked sooner.  Performed at [facility], [place] [address] [place], [place] [address]         Radiology Studies: DG Chest Left Decubitus  Result Date: 05/22/2020 CLINICAL DATA:  Left pleural effusion, short of breath EXAM: CHEST - LEFT DECUBITUS COMPARISON:  05/20/2020 FINDINGS: Left lateral decubitus  radiograph of the chest was performed. Evaluation is limited by overlying structures obscuring the left upper lung zone. There is a free-flowing left pleural effusion volume estimated far less than 1 L. there is significant consolidation of the left lower lobe compatible with pneumonia. Right chest is clear. IMPRESSION: 1. Free-flowing component of the left pleural effusion volume estimated far less than 1 L. 2. Dense left basilar consolidation compatible with pneumonia. Electronically Signed   By: Randa Ngo M.D.   On: 05/22/2020 15:36   US RENAL  Result Date: 05/22/2020 CLINICAL DATA:  Acute renal insufficiency EXAM: RENAL / URINARY TRACT ULTRASOUND COMPLETE COMPARISON:  None. FINDINGS: Right Kidney: Renal measurements: 8.5 x 4.1 x 4.3 cm = volume: 89 mL. Contains a 1.9 cm cyst. No hydronephrosis. Mild increased cortical echogenicity. Left Kidney: Renal measurements: 8.7 x 4.0 x 3.9 cm = volume: 70 mL. Contains a 9 mm cyst. Mild increased cortical echogenicity. Bladder: Appears normal for degree of bladder distention. Other: None. IMPRESSION: 1. Mild increased cortical echogenicity suggesting medical renal disease. Small bilateral renal cysts as above. Electronically Signed   By: Dorise Bullion III M.D   On: 05/22/2020 09:40   ECHOCARDIOGRAM COMPLETE  Result Date: 05/21/2020    ECHOCARDIOGRAM REPORT   Patient Name:   HANIN DECOOK Date of Exam: 05/21/2020 Medical Rec #:  [id number]  Height:       61.0 in Accession #:    [id number]    Weight:       151.4 lb Date of Birth:  1933-11-27      BSA:          1.678 m Patient Age:    6 years      BP:           147/87 mmHg Patient Gender: F             HR:           74 bpm. Exam Location:  Inpatient Procedure: 2D Echo, Cardiac Doppler and Color Doppler Indications:    I50.23 Acute on chronic systolic (congestive) heart failure  History:        Patient has prior history of Echocardiogram examinations, most                 recent 08/02/2016. Cardiomyopathy, Stroke  and COPD, Aortic Valve                 Disease; Risk Factors:Hypertension, Diabetes and Dyslipidemia.                 Canser.  Sonographer:    Jonelle Sidle Dance Referring Phys: Hom[place]  1. Left ventricular ejection fraction, by estimation, is 60 to 65%. The left ventricle has normal function. The left ventricle has no regional wall motion abnormalities. There is moderate left ventricular hypertrophy. Left ventricular diastolic parameters are consistent with Grade II diastolic dysfunction (pseudonormalization).  2. Right ventricular systolic function is normal. The right ventricular size is normal.  3. Left atrial size was severely dilated.  4. The trivial pericardial effusion is posterior to the left ventricle. Moderate pleural effusion in the left lateral region.  5. The mitral valve is abnormal. Trivial mitral valve regurgitation. Moderate to severe mitral annular calcification.  6. The aortic valve is trileaflet, however, the non-coronary cusp is calcified and restricted. Aortic valve regurgitation is trivial. Moderate aortic valve stenosis. Aortic valve area, by VTI measures 0.76 cm. Aortic valve mean gradient measures 28.0 mmHg. Aortic valve Vmax measures 3.81 m/s. Comparison(s): Prior images unable to be directly viewed, comparison made by report only. Changes from prior study are noted. 08/02/2016: LVEF 65-70%, grade 2 DD, mild AS -13 mmHg mean gradient. Conclusion(s)/Recommendation(s): Consider further aortic valve evaluation with TEE. FINDINGS  Left Ventricle: Left ventricular ejection fraction, by estimation, is 60 to 65%. The left ventricle has normal function. The left ventricle has no regional wall motion abnormalities. The left ventricular internal cavity size was normal in size. There is  moderate left ventricular hypertrophy. Left ventricular diastolic parameters are consistent with Grade II diastolic dysfunction (pseudonormalization). Indeterminate filling pressures. Right  Ventricle: The right ventricular size is normal. No increase in right ventricular wall thickness. Right ventricular systolic function is normal. Left Atrium: Left atrial size was severely dilated. Right Atrium: Right atrial size was normal in size. Pericardium: Trivial pericardial effusion is present. The pericardial effusion is posterior to the left ventricle. Mitral Valve: The mitral valve is abnormal. There is moderate thickening of the mitral valve leaflet(s). Moderate to severe mitral annular calcification. Trivial mitral valve regurgitation. Tricuspid Valve: The tricuspid valve is grossly normal. Tricuspid valve regurgitation is trivial. Aortic Valve: The non-coronary cusp appears calcified and resticted. The aortic valve is tricuspid. Aortic valve regurgitation is trivial. Moderate aortic stenosis is present. Aortic valve mean gradient measures 28.0 mmHg. Aortic valve peak gradient measures 58.1  mmHg. Aortic valve area, by VTI measures 0.76 cm. Pulmonic Valve: The pulmonic valve was grossly normal. Pulmonic valve regurgitation is mild. Aorta: The aortic root and ascending aorta are structurally normal, with no evidence of dilitation. IAS/Shunts: No atrial level shunt detected by color flow Doppler. Additional Comments: There is a moderate pleural effusion in the left lateral region.  LEFT VENTRICLE PLAX 2D LVIDd:         3.70 cm LVIDs:         2.50 cm LV PW:         1.60 cm LV IVS:        1.60 cm LVOT diam:     1.90 cm LV SV:         71 LV SV Index:   42 LVOT Area:     2.84 cm  RIGHT VENTRICLE             IVC RV Basal diam:  2.30 cm     IVC diam: 2.10 cm RV S prime:     12.10 cm/s TAPSE (M-mode): 2.0 cm LEFT ATRIUM              Index       RIGHT ATRIUM          Index LA diam:        4.40 cm  2.62 cm/m  RA Area:     9.60 cm LA Vol (A2C):   121.0 ml 72.11 ml/m RA Volume:   14.00 ml 8.34 ml/m LA Vol (A4C):   102.0 ml 60.79 ml/m LA Biplane Vol: 114.0 ml 67.94 ml/m  AORTIC VALVE AV Area (Vmax):    1.12 cm  AV Area (Vmean):   1.11 cm AV Area (VTI):     0.76 cm AV Vmax:           381.00 cm/s AV Vmean:          239.000 cm/s AV VTI:            0.940 m AV Peak Grad:      58.1 mmHg AV Mean Grad:      28.0 mmHg LVOT Vmax:         151.00 cm/s LVOT Vmean:        93.300 cm/s LVOT VTI:          0.251 m LVOT/AV VTI ratio: 0.27  AORTA Ao Root diam: 3.10 cm Ao Asc diam:  3.50 cm MITRAL VALVE MV Area (PHT): 2.24 cm     SHUNTS MV Decel Time: 338 msec     Systemic VTI:  0.25 m MV E velocity: 163.00 cm/s  Systemic Diam: 1.90 cm MV A velocity: 140.00 cm/s MV E/A ratio:  1.16 Lyman Bishop MD Electronically signed by Lyman Bishop MD Signature Date/Time: 05/21/2020/12:37:14 PM    Final         Scheduled Meds: . amLODipine  10 mg Oral Daily  . aspirin EC  81 mg Oral Daily  . diazepam  2.5 mg Oral Daily  . ferrous sulfate  325 mg Oral BID  . hydrocerin   Topical BID  . insulin aspart  0-9 Units Subcutaneous TID WC  . levothyroxine  37.5 mcg Oral Q0600  . metoprolol succinate  25 mg Oral Daily  . sodium chloride flush  3 mL Intravenous Q12H   Continuous Infusions: . sodium chloride    . heparin 950 Units/hr (05/22/20 1835)     LOS: 3 days     Cordelia Poche, MD [facility] 05/23/2020, 8:43 AM  If 7PM-7AM, please contact night-coverage [email or web address]

## 2020-05-23 NOTE — Plan of Care (Signed)
  Problem: Education: Goal: Knowledge of General Education information will improve Description Including pain rating scale, medication(s)/side effects and non-pharmacologic comfort measures Outcome: Progressing   Problem: Activity: Goal: Risk for activity intolerance will decrease Outcome: Progressing   Problem: Safety: Goal: Ability to remain free from injury will improve Outcome: Progressing   

## 2020-05-23 NOTE — Progress Notes (Addendum)
[facility] Progress Note  Subjective: alert, nasal O2  Vitals:   05/23/20 0520 05/23/20 1321 05/23/20 1410 05/23/20 1440  BP: (!) 174/67 (!) 172/75 120/74 (!) 129/53  Pulse: 82 72    Resp: (!) 24 14    Temp: (!) 97.5 F (36.4 C) 97.7 F (36.5 C)    TempSrc: Oral Oral    SpO2: 98% 99%    Weight:      Height:        Exam:   Gen: elderly woman sitting comfortably in bed  Eyes: anicteric, EOMI, glasses  ENT: MMM Neck: supple no JVD noted at 30 degrees CV: irreg irreg, reg rate, III/VI SEM Abd:  Soft, nontender Lungs: dec BS to mid L lung with a few exp wheezes, normal WOB, no rales GU: no foley Extr:  2+ pitting LE edema and bilat 1+ UE edema Neuro: grossly nonfocal Skin: no rashes     Renal US 5/1 - 8.3/ 8.5 cm kidneys w/ mildly ^'d echo, no hydro   UA 4/29 - clear, >300 prot, rare bact, >50 rbc, 0-5 wbc   UP/C ratio = 10.9    4/29 - dense LLL consolidation, ^vasc congestion    5/1 - left decub > IMPRESSION: Free-flowing component of the left pleural effusion volume estimated far less than 1 L. Dense left basilar consolidation compatible with pneumonia   5/2 - IMPRESSION: 1. Significant decrease in left pleural effusion without evidence for complication. 2. Small right pleural effusion.    BP 170/ 68  HR 78  RR 15-22  Temp 97.5   Assessment/ Plan: 1. AKI, Nonoliguric:  Last known Cr 1.0 in 2020 most recent creat was 2.1 in early 04/2020 now trending up after diuresis to 2.9 today.   Imaging w/o obstruction but consistent with CKD (smaller sized kidneys, 8-8.5 cm).  UA with microhematuria and protein.  UP/C ratio is high at 10.8. Serologies pending. Cont to hold RAAS blockers.  Avoid nephrotoxins as able.  Avoid hypotension. We are concerned about possible GN w/ neph syndrome which would portend a significantly poor prognosis. Will know more in 1-2 days I believe. Place foley cath for 1-2 days.  Have d/w pt and w/ the daughter, they know this may be serious. Pt  doesn't want any significant invasive medical care including dialysis.  2. HTN:  On toprol and hydralazine (norvasc dc'd given edema).  Hold home ARB in light of AKI. 3. HFpEF:  Grade 2 diastolic dysfunction noted.  Appreciate cardiology assistance.   They agree on holding on diuresis today.  4. Pleural effusion:  Agree with further eval.   5. Anemia:  H/o iron deficiency, on po iron.  Hb in the 10s.  Recheck iron indices.  Check SPEP, SFLC. 6. h/o CLL/MBUS:  Formerly followed with oncology.  An outpt f/u should be considered.   Rob Cortavius Montesinos 05/23/2020, 3:51 PM   Recent Labs  Lab 05/22/20 0434 05/23/20 0421  K 4.5 4.7  BUN 45* 48*  CREATININE 2.92* 2.98*  CALCIUM 7.9* 8.0*  PHOS  --  4.2  HGB 10.5* 9.9*   Inpatient medications: . aspirin EC  81 mg Oral Daily  . diazepam  2.5 mg Oral Daily  . ferrous sulfate  325 mg Oral BID  . heparin  5,000 Units Subcutaneous Q8H  . hydrALAZINE  25 mg Oral Q8H  . hydrocerin   Topical BID  . insulin aspart  0-9 Units Subcutaneous TID WC  . levothyroxine  37.5 mcg Oral Q0600  .  lidocaine      . metoprolol succinate  25 mg Oral Daily  . sodium chloride flush  3 mL Intravenous Q12H   . sodium chloride     sodium chloride, acetaminophen, hydrALAZINE, ondansetron (ZOFRAN) IV, sodium chloride flush

## 2020-05-23 NOTE — Progress Notes (Signed)
[employer] for IV heparin Indication: atrial fibrillation  Allergies  Allergen Reactions  . Codeine Other (See Comments)    Passed out  . Oxycodone Other (See Comments)    Visual Hallucinations  . Roxanol [Morphine] Hives and Rash  . Penicillins Rash    Has patient had a PCN reaction causing immediate rash, facial/tongue/throat swelling, SOB or lightheadedness with hypotension: Yes Has patient had a PCN reaction causing severe rash involving mucus membranes or skin necrosis: Yes Has patient had a PCN reaction that required hospitalization: No Has patient had a PCN reaction occurring within the last 10 years: No If all of the above answers are "NO", then may proceed with Cephalosporin use.    Patient Measurements: Height: 5\' 1"  (154.9 cm) Weight: 69.2 kg (152 lb 8.9 oz) IBW/kg (Calculated) : 47.8 Heparin Dosing Weight: 62 kg (most recent wt 68.7 kg on 4/4)  Vital Signs: Temp: 97.5 F (36.4 C) (05/02 0520) Temp Source: Oral (05/02 0520) BP: 174/67 (05/02 0520) Pulse Rate: 82 (05/02 0520)  Labs: Recent Labs    05/20/20 1400 05/20/20 1453 05/20/20 1905 05/21/20 0008 05/21/20 0918 05/22/20 0434 05/22/20 1659 05/22/20 2151 05/23/20 0421  HGB  --   --   --  10.4*  --  10.5*  --   --  9.9*  HCT  --   --   --  33.7*  --  34.8*  --   --  32.7*  PLT  --   --   --  280  --  279  --   --  292  APTT 37*  --   --   --   --   --   --   --   --   LABPROT 12.1  --   --   --   --   --   --   --   --   INR 0.9  --   --   --   --   --   --   --   --   HEPARINUNFRC  --   --   --  0.16*   < > 0.17* 0.31 0.35 0.43  CREATININE  --   --   --  2.78*  --  2.92*  --   --  2.98*  TROPONINIHS  --  56* 52*  --   --   --   --   --   --    < > = values in this interval not displayed.    Estimated Creatinine Clearance: 12.5 mL/min (A) (by C-G formula based on SCr of 2.98 mg/dL (H)).   Medical History: Past Medical History:  Diagnosis Date  . Allergy    . Anemia   . Aortic stenosis   . Aortic stenosis, moderate 06/16/2011  . Blood transfusion without reported diagnosis 2010?  Marland Kitchen Cardiomyopathy ([place])   . CLL (chronic lymphoblastic leukemia) 12/01/2010  . Diabetes mellitus   . Dry skin dermatitis 12/01/2010  . Hyperlipidemia   . Hypertension   . Iron deficiency 12/01/2010  . Monoclonal B-cell lymphocytosis of unknown significance 12/02/2014  . Osteopenia   . Skin cancer   . Stroke [facility])    1999    Medications:  Scheduled:  . amLODipine  10 mg Oral Daily  . aspirin EC  81 mg Oral Daily  . diazepam  2.5 mg Oral Daily  . ferrous sulfate  325 mg Oral BID  . hydrocerin   Topical BID  .  insulin aspart  0-9 Units Subcutaneous TID WC  . levothyroxine  37.5 mcg Oral Q0600  . metoprolol succinate  25 mg Oral Daily  . sodium chloride flush  3 mL Intravenous Q12H   Infusions:  . sodium chloride    . heparin 950 Units/hr (05/22/20 1835)    Assessment: 87 yo female with new onset Afib to start IV heparin per pharmacy dosing.  CHA2DS2-VASc Scoreis 6, but history of GI Bleed and recent fall. Baseline CBC- Hg slightly low at 11, pltc WNL. Aptt, PT/INR WNL.   05/23/2020:  HL = 0.43 - therapeutic on heparin infusion of 950 units/hr  Heparin infusing at correct rate and no interruption in infusion during night. No signs of bleeding.   CBC: Hgb 9.9 - slight drop; Plt WNL/stable  Scr elevated at 2.98  Goal of Therapy:  Heparin level 0.3-0.7 units/ml Monitor platelets by anticoagulation protocol: Yes   Plan:   Continue Heparin infusion at 950 units/hr  Daily CBC and heparin level while on heparin  F/U long-term anticoagulation plans  Minda Ditto PharmD Clinical Pharmacist [place] Please utilize Amion for appropriate phone number to reach the unit pharmacist ([place]) 05/23/2020 10:04 AM

## 2020-05-23 NOTE — Progress Notes (Signed)
Outpatient monitor ordered for evaluation of possible atrial fibrillation. Please see Dr. Theodosia Blender rounding note from today for more information.  Darreld Mclean, PA-C 05/23/2020 5:03 PM

## 2020-05-23 NOTE — Progress Notes (Addendum)
Progress Note  Patient Name: Norma Smith Date of Encounter: 05/23/2020  [facility] Cardiologist: Kirk Ruths, MD  Subjective   Denies any chest pain or pressure.  BP elevated today at 174/70mmhg.    Inpatient Medications    Scheduled Meds: . amLODipine  10 mg Oral Daily  . aspirin EC  81 mg Oral Daily  . diazepam  2.5 mg Oral Daily  . ferrous sulfate  325 mg Oral BID  . hydrocerin   Topical BID  . insulin aspart  0-9 Units Subcutaneous TID WC  . levothyroxine  37.5 mcg Oral Q0600  . metoprolol succinate  25 mg Oral Daily  . sodium chloride flush  3 mL Intravenous Q12H   Continuous Infusions: . sodium chloride    . heparin 950 Units/hr (05/22/20 1835)   PRN Meds: sodium chloride, acetaminophen, hydrALAZINE, ondansetron (ZOFRAN) IV, sodium chloride flush   Vital Signs    Vitals:   05/22/20 1341 05/22/20 1853 05/22/20 2104 05/23/20 0520  BP: (!) 186/59 (!) 155/54 (!) 153/59 (!) 174/67  Pulse: 65 63 70 82  Resp: 20 19 (!) 21 (!) 24  Temp: 97.7 F (36.5 C) 97.9 F (36.6 C) 98.2 F (36.8 C) (!) 97.5 F (36.4 C)  TempSrc: Oral Oral Oral Oral  SpO2: 96% 100% 98% 98%  Weight:      Height:        Intake/Output Summary (Last 24 hours) at 05/23/2020 1057 Last data filed at 05/23/2020 0903 Gross per 24 hour  Intake 1008.42 ml  Output 400 ml  Net 608.42 ml   Last 3 Weights 05/22/2020 05/21/2020 04/25/2020  Weight (lbs) 152 lb 8.9 oz 151 lb 3.8 oz 151 lb 6.4 oz  Weight (kg) 69.2 kg 68.6 kg 68.675 kg      Telemetry    NSR with frequent PACs - Personally Reviewed  ECG    No new EKG to review - Personally Reviewed  Physical Exam   GEN: No acute distress.   Neck: No JVD Cardiac: RRR, no murmurs, rubs, or gallops.  Respiratory: Clear to auscultation bilaterally. GI: Soft, nontender, non-distended  MS: 3+ pitting LE edema past her knees as well as her arms; No deformity. Neuro:  Nonfocal  Psych: Normal affect   Labs    High Sensitivity Troponin:    Recent Labs  Lab 05/20/20 1453 05/20/20 1905  TROPONINIHS 56* 52*      Chemistry Recent Labs  Lab 05/21/20 0008 05/22/20 0434 05/23/20 0421  NA 142 143 144  K 4.3 4.5 4.7  CL 107 108 111  CO2 26 25 25   GLUCOSE 131* 158* 147*  BUN 40* 45* 48*  CREATININE 2.78* 2.92* 2.98*  CALCIUM 7.8* 7.9* 8.0*  GFRNONAA 16* 15* 15*  ANIONGAP 9 10 8      Hematology Recent Labs  Lab 05/21/20 0008 05/22/20 0434 05/23/20 0421  WBC 12.2* 13.5* 13.2*  RBC 3.40* 3.43* 3.24*  HGB 10.4* 10.5* 9.9*  HCT 33.7* 34.8* 32.7*  MCV 99.1 101.5* 100.9*  MCH 30.6 30.6 30.6  MCHC 30.9 30.2 30.3  RDW 13.5 13.7 13.6  PLT 280 279 292    BNP Recent Labs  Lab 05/20/20 1255  BNP 1,151.9*     DDimer No results for input(s): DDIMER in the last 168 hours.    Radiology    DG Chest Left Decubitus  Result Date: 05/22/2020 CLINICAL DATA:  Left pleural effusion, short of breath EXAM: CHEST - LEFT DECUBITUS COMPARISON:  05/20/2020 FINDINGS: Left lateral decubitus radiograph  of the chest was performed. Evaluation is limited by overlying structures obscuring the left upper lung zone. There is a free-flowing left pleural effusion volume estimated far less than 1 L. there is significant consolidation of the left lower lobe compatible with pneumonia. Right chest is clear. IMPRESSION: 1. Free-flowing component of the left pleural effusion volume estimated far less than 1 L. 2. Dense left basilar consolidation compatible with pneumonia. Electronically Signed   By: Randa Ngo M.D.   On: 05/22/2020 15:36   US RENAL  Result Date: 05/22/2020 CLINICAL DATA:  Acute renal insufficiency EXAM: RENAL / URINARY TRACT ULTRASOUND COMPLETE COMPARISON:  None. FINDINGS: Right Kidney: Renal measurements: 8.5 x 4.1 x 4.3 cm = volume: 89 mL. Contains a 1.9 cm cyst. No hydronephrosis. Mild increased cortical echogenicity. Left Kidney: Renal measurements: 8.7 x 4.0 x 3.9 cm = volume: 70 mL. Contains a 9 mm cyst. Mild increased  cortical echogenicity. Bladder: Appears normal for degree of bladder distention. Other: None. IMPRESSION: 1. Mild increased cortical echogenicity suggesting medical renal disease. Small bilateral renal cysts as above. Electronically Signed   By: Dorise Bullion III M.D   On: 05/22/2020 09:40   ECHOCARDIOGRAM COMPLETE  Result Date: 05/21/2020    ECHOCARDIOGRAM REPORT   Patient Name:   Norma Smith Date of Exam: 05/21/2020 Medical Rec #:  [id number]     Height:       61.0 in Accession #:    [id number]    Weight:       151.4 lb Date of Birth:  12/19/1933      BSA:          1.678 m Patient Age:    87 years      BP:           147/87 mmHg Patient Gender: F             HR:           74 bpm. Exam Location:  Inpatient Procedure: 2D Echo, Cardiac Doppler and Color Doppler Indications:    I50.23 Acute on chronic systolic (congestive) heart failure  History:        Patient has prior history of Echocardiogram examinations, most                 recent 08/02/2016. Cardiomyopathy, Stroke and COPD, Aortic Valve                 Disease; Risk Factors:Hypertension, Diabetes and Dyslipidemia.                 Canser.  Sonographer:    Jonelle Sidle Dance Referring Phys: [place]  1. Left ventricular ejection fraction, by estimation, is 60 to 65%. The left ventricle has normal function. The left ventricle has no regional wall motion abnormalities. There is moderate left ventricular hypertrophy. Left ventricular diastolic parameters are consistent with Grade II diastolic dysfunction (pseudonormalization).  2. Right ventricular systolic function is normal. The right ventricular size is normal.  3. Left atrial size was severely dilated.  4. The trivial pericardial effusion is posterior to the left ventricle. Moderate pleural effusion in the left lateral region.  5. The mitral valve is abnormal. Trivial mitral valve regurgitation. Moderate to severe mitral annular calcification.  6. The aortic valve is trileaflet, however,  the non-coronary cusp is calcified and restricted. Aortic valve regurgitation is trivial. Moderate aortic valve stenosis. Aortic valve area, by VTI measures 0.76 cm. Aortic valve mean gradient measures 28.0 mmHg. Aortic valve  Vmax measures 3.81 m/s. Comparison(s): Prior images unable to be directly viewed, comparison made by report only. Changes from prior study are noted. 08/02/2016: LVEF 65-70%, grade 2 DD, mild AS -13 mmHg mean gradient. Conclusion(s)/Recommendation(s): Consider further aortic valve evaluation with TEE. FINDINGS  Left Ventricle: Left ventricular ejection fraction, by estimation, is 60 to 65%. The left ventricle has normal function. The left ventricle has no regional wall motion abnormalities. The left ventricular internal cavity size was normal in size. There is  moderate left ventricular hypertrophy. Left ventricular diastolic parameters are consistent with Grade II diastolic dysfunction (pseudonormalization). Indeterminate filling pressures. Right Ventricle: The right ventricular size is normal. No increase in right ventricular wall thickness. Right ventricular systolic function is normal. Left Atrium: Left atrial size was severely dilated. Right Atrium: Right atrial size was normal in size. Pericardium: Trivial pericardial effusion is present. The pericardial effusion is posterior to the left ventricle. Mitral Valve: The mitral valve is abnormal. There is moderate thickening of the mitral valve leaflet(s). Moderate to severe mitral annular calcification. Trivial mitral valve regurgitation. Tricuspid Valve: The tricuspid valve is grossly normal. Tricuspid valve regurgitation is trivial. Aortic Valve: The non-coronary cusp appears calcified and resticted. The aortic valve is tricuspid. Aortic valve regurgitation is trivial. Moderate aortic stenosis is present. Aortic valve mean gradient measures 28.0 mmHg. Aortic valve peak gradient measures 58.1 mmHg. Aortic valve area, by VTI measures 0.76  cm. Pulmonic Valve: The pulmonic valve was grossly normal. Pulmonic valve regurgitation is mild. Aorta: The aortic root and ascending aorta are structurally normal, with no evidence of dilitation. IAS/Shunts: No atrial level shunt detected by color flow Doppler. Additional Comments: There is a moderate pleural effusion in the left lateral region.  LEFT VENTRICLE PLAX 2D LVIDd:         3.70 cm LVIDs:         2.50 cm LV PW:         1.60 cm LV IVS:        1.60 cm LVOT diam:     1.90 cm LV SV:         71 LV SV Index:   42 LVOT Area:     2.84 cm  RIGHT VENTRICLE             IVC RV Basal diam:  2.30 cm     IVC diam: 2.10 cm RV S prime:     12.10 cm/s TAPSE (M-mode): 2.0 cm LEFT ATRIUM              Index       RIGHT ATRIUM          Index LA diam:        4.40 cm  2.62 cm/m  RA Area:     9.60 cm LA Vol (A2C):   121.0 ml 72.11 ml/m RA Volume:   14.00 ml 8.34 ml/m LA Vol (A4C):   102.0 ml 60.79 ml/m LA Biplane Vol: 114.0 ml 67.94 ml/m  AORTIC VALVE AV Area (Vmax):    1.12 cm AV Area (Vmean):   1.11 cm AV Area (VTI):     0.76 cm AV Vmax:           381.00 cm/s AV Vmean:          239.000 cm/s AV VTI:            0.940 m AV Peak Grad:      58.1 mmHg AV Mean Grad:      28.0 mmHg LVOT  Vmax:         151.00 cm/s LVOT Vmean:        93.300 cm/s LVOT VTI:          0.251 m LVOT/AV VTI ratio: 0.27  AORTA Ao Root diam: 3.10 cm Ao Asc diam:  3.50 cm MITRAL VALVE MV Area (PHT): 2.24 cm     SHUNTS MV Decel Time: 338 msec     Systemic VTI:  0.25 m MV E velocity: 163.00 cm/s  Systemic Diam: 1.90 cm MV A velocity: 140.00 cm/s MV E/A ratio:  1.16 Lyman Bishop MD Electronically signed by Lyman Bishop MD Signature Date/Time: 05/21/2020/12:37:14 PM    Final     Cardiac Studies   2D echo 05/21/2020 IMPRESSIONS   1. Left ventricular ejection fraction, by estimation, is 60 to 65%. The  left ventricle has normal function. The left ventricle has no regional  wall motion abnormalities. There is moderate left ventricular hypertrophy.   Left ventricular diastolic  parameters are consistent with Grade II diastolic dysfunction  (pseudonormalization).  2. Right ventricular systolic function is normal. The right ventricular  size is normal.  3. Left atrial size was severely dilated.  4. The trivial pericardial effusion is posterior to the left ventricle.  Moderate pleural effusion in the left lateral region.  5. The mitral valve is abnormal. Trivial mitral valve regurgitation.  Moderate to severe mitral annular calcification.  6. The aortic valve is trileaflet, however, the non-coronary cusp is  calcified and restricted. Aortic valve regurgitation is trivial. Moderate  aortic valve stenosis. Aortic valve area, by VTI measures 0.76 cm. Aortic  valve mean gradient measures 28.0  mmHg. Aortic valve Vmax measures 3.81 m/s.   Comparison(s): Prior images unable to be directly viewed, comparison made  by report only. Changes from prior study are noted. 08/02/2016: LVEF  65-70%, grade 2 DD, mild AS -13 mmHg mean gradient.   Conclusion(s)/Recommendation(s): Consider further aortic valve evaluation  with TEE.   Patient Profile     87 y.o. female with a PMH of hypertrophic cardiomyopathy, aortic stenosis, HTN, HLD, DM type 2, CVA, CKD stage IV, hypothyroidism, and anxiety, who is being seen today for the evaluation of CHF and atrial fibrillation at the request of Dr. Lonny Prude.   Assessment & Plan    1. Acute on chronic diastolic CHF:  -patient with evidence of anasarca on exam.  -BNP elevated to 1151. CXR with evidence of dense consolidation at the LLL with moderate left pleural effusion and central vascular congestion.  -Echo showed EF 60-65%, G2DD, and moderate AS.  -She was started on IV lasix 40mg  BID (received 3 doses), however this was held given rise in Cr from 2.66>2.91 (baseline 2.11 on check 04/22/20, though was 1.0 in 2020).  -Albumin 04/22/20 was 2.8 which is likely contributing to her 3rd spacing -she has  significant decreased BS in the left lung fields c/w her Cxray with consolidation and pleural effusion -left lat decub Xray showed free-flowing component of left pleural effusion with volume fall less than 1L along with dense left basilar consolidation c/w PNA -I do not think she is intravascularly volume overloaded especially with worsening renal function after diuretics. -her elevated BNP and effusion likely related to underlying PNA and 3rd spacing from low albumin and no CHF -EF is normal and only has moderate AS so I do not thing this is playing a role -she does have known chronic diastolic CHF but her breathing may be more related to 3rd spacing with  pleural effusion in her lung -appreciate renal consult>>holding diuretics -will stop amlodipine which can contribute to worsening LE edema and use Hydralazine for BP control and add compression hose  2. Elevated HsTrop:  -Trop 56>52  - low flat trend not c/w ACS.  - 2D echo normal with no RWMAs - Suspect demand ischemia in the setting of moderate AS, HTN urgency on admission, continuation of ARB despite instructions to stop outpt and AKI  3. ? New onset atrial fibrillation: -I have reviewed the EKGs and do not think that this is atrial fibrillation -her tele has shown all NSR with PACs and occasional 4 beats runs of WCT vs. PACs with aberration -consider outpt event monitor to assess further  4. HOCM: moderate LVH on echo this admission.   5. Aortic stenosis:  -moderate on echo this admission.  -Patient has not been interested in invasive measures in the past.  -Continue routine monitoring  6. HTN:  -BP significantly elevated this admission.  -She is on home amlodipine and metoprolol succinate -BP today 174/34mmHg  -given her marked LE edema will stop amlodipine -start Hydralazine 25mg  TID -continue Toprol XL 25mg  daily  7. HLD:  -LDL markedly elevated to 240 on lipids 04/22/20. -Patient reported to have mistakenly stopped  her atorvastatin rather than her irbesartan.  -restarted atorvastatin 40mg  daily (previously on 20mg  daily) and repeat FLP and ALT in 6 weeks  8. AKI on CKD stage IV:  -it appears there was a miscommunication recently in regards to recommendations to stop irbesartan.  -Patient instead stopped atorvastatin and continue irbesartan, likely adding to her progressive CKD. -appreciate renal input  She seems stable from a cardiac standpoint.  Does not appear to be in CHF and no afib on tele.  Will sign off.   [facility] will sign off.   Medication Recommendations: ASA 81mg  daily, Toprol XL 25mg  daily (may need higher dose pending BP trend), Hydralazine (final dose per THR pending need for additional BP control) Atorvastatin 40mg  daily.  Final Lasix dose per nephrology Other recommendations (labs, testing, etc):  FLP and ALT in 6 weeks, event monitor 30 days Follow up as an outpatient:  Followup with Dr. Stanford Breed or extender in 7-10 days  For questions or updates, please contact [facility] Please consult [email or web address] for contact info under        Signed, Fransico Him, MD  05/23/2020, 10:57 AM

## 2020-05-23 NOTE — Procedures (Signed)
PROCEDURE SUMMARY:  Successful US guided left thoracentesis. Yielded 800 ml of clear yellow fluid. Pt tolerated procedure well. No immediate complications.  Specimen sent for labs. CXR ordered; pending  EBL < 2 mL  Theresa Duty, NP 05/23/2020 3:12 PM

## 2020-05-24 ENCOUNTER — Inpatient Hospital Stay (HOSPITAL_COMMUNITY): Payer: Medicare HMO

## 2020-05-24 DIAGNOSIS — I5031 Acute diastolic (congestive) heart failure: Secondary | ICD-10-CM | POA: Diagnosis not present

## 2020-05-24 DIAGNOSIS — N184 Chronic kidney disease, stage 4 (severe): Secondary | ICD-10-CM | POA: Diagnosis not present

## 2020-05-24 DIAGNOSIS — F419 Anxiety disorder, unspecified: Secondary | ICD-10-CM | POA: Diagnosis not present

## 2020-05-24 DIAGNOSIS — R3121 Asymptomatic microscopic hematuria: Secondary | ICD-10-CM | POA: Diagnosis not present

## 2020-05-24 LAB — GLUCOSE, CAPILLARY
Glucose-Capillary: 115 mg/dL — ABNORMAL HIGH (ref 70–99)
Glucose-Capillary: 123 mg/dL — ABNORMAL HIGH (ref 70–99)
Glucose-Capillary: 103 mg/dL — ABNORMAL HIGH (ref 70–99)
Glucose-Capillary: 119 mg/dL — ABNORMAL HIGH (ref 70–99)

## 2020-05-24 LAB — COMPREHENSIVE METABOLIC PANEL
ALT: 19 U/L (ref 0–44)
AST: 26 U/L (ref 15–41)
Albumin: 1.8 g/dL — ABNORMAL LOW (ref 3.5–5.0)
Alkaline Phosphatase: 51 U/L (ref 38–126)
Anion gap: 11 (ref 5–15)
BUN: 49 mg/dL — ABNORMAL HIGH (ref 8–23)
CO2: 24 mmol/L (ref 22–32)
Calcium: 8.2 mg/dL — ABNORMAL LOW (ref 8.9–10.3)
Chloride: 112 mmol/L — ABNORMAL HIGH (ref 98–111)
Creatinine, Ser: 2.83 mg/dL — ABNORMAL HIGH (ref 0.44–1.00)
GFR, Estimated: 16 mL/min — ABNORMAL LOW (ref >60)
Glucose, Bld: 110 mg/dL — ABNORMAL HIGH (ref 70–99)
Potassium: 4.7 mmol/L (ref 3.5–5.1)
Sodium: 147 mmol/L — ABNORMAL HIGH (ref 135–145)
Total Bilirubin: 0.3 mg/dL (ref 0.3–1.2)
Total Protein: 4.9 g/dL — ABNORMAL LOW (ref 6.5–8.1)

## 2020-05-24 LAB — ENA+DNA/DS+ANTICH+CENTRO+JO...
Anti JO-1: <0.2 AI (ref 0.0–0.9)
Centromere Ab Screen: <0.2 AI (ref 0.0–0.9)
Chromatin Ab SerPl-aCnc: <0.2 AI (ref 0.0–0.9)
ENA SM Ab Ser-aCnc: <0.2 AI (ref 0.0–0.9)
Ribonucleic Protein: <0.2 AI (ref 0.0–0.9)
SSA (Ro) (ENA) Antibody, IgG: <0.2 AI (ref 0.0–0.9)
SSB (La) (ENA) Antibody, IgG: <0.2 AI (ref 0.0–0.9)
Scleroderma (Scl-70) (ENA) Antibody, IgG: <0.2 AI (ref 0.0–0.9)
ds DNA Ab: 20 IU/mL — ABNORMAL HIGH (ref 0–9)

## 2020-05-24 LAB — MPO/PR-3 (ANCA) ANTIBODIES
ANCA Proteinase 3: <3.5 U/mL (ref 0.0–3.5)
Myeloperoxidase Abs: <9 U/mL (ref 0.0–9.0)

## 2020-05-24 LAB — ANA W/REFLEX IF POSITIVE: Anti Nuclear Antibody (ANA): POSITIVE — AB

## 2020-05-24 LAB — ANTISTREPTOLYSIN O TITER: ASO: 194 IU/mL (ref 0.0–200.0)

## 2020-05-24 LAB — BASIC METABOLIC PANEL
Anion gap: 10 (ref 5–15)
BUN: 49 mg/dL — ABNORMAL HIGH (ref 8–23)
CO2: 24 mmol/L (ref 22–32)
Calcium: 7.9 mg/dL — ABNORMAL LOW (ref 8.9–10.3)
Chloride: 109 mmol/L (ref 98–111)
Creatinine, Ser: 2.85 mg/dL — ABNORMAL HIGH (ref 0.44–1.00)
GFR, Estimated: 16 mL/min — ABNORMAL LOW (ref >60)
Glucose, Bld: 131 mg/dL — ABNORMAL HIGH (ref 70–99)
Potassium: 5.2 mmol/L — ABNORMAL HIGH (ref 3.5–5.1)
Sodium: 143 mmol/L (ref 135–145)

## 2020-05-24 LAB — KAPPA/LAMBDA LIGHT CHAINS
Kappa free light chain: 92.5 mg/L — ABNORMAL HIGH (ref 3.3–19.4)
Kappa, lambda light chain ratio: 1.28 (ref 0.26–1.65)
Lambda free light chains: 72.5 mg/L — ABNORMAL HIGH (ref 5.7–26.3)

## 2020-05-24 LAB — HEPATITIS B SURFACE ANTIBODY, QUANTITATIVE: Hep B S AB Quant (Post): <3.1 m[IU]/mL — ABNORMAL LOW (ref >9.9)

## 2020-05-24 LAB — PATHOLOGIST SMEAR REVIEW

## 2020-05-24 LAB — C3 COMPLEMENT: C3 Complement: 136 mg/dL (ref 82–167)

## 2020-05-24 LAB — C4 COMPLEMENT: Complement C4, Body Fluid: 48 mg/dL — ABNORMAL HIGH (ref 12–38)

## 2020-05-24 LAB — GLOMERULAR BASEMENT MEMBRANE ANTIBODIES: GBM Ab: 3 units (ref 0–20)

## 2020-05-24 LAB — AMMONIA: Ammonia: 12 umol/L (ref 9–35)

## 2020-05-24 MED ORDER — AZITHROMYCIN 250 MG PO TABS
500.0000 mg | ORAL_TABLET | Freq: Every day | ORAL | Status: DC
  Administered 2020-05-24 – 2020-05-25 (×2): 500 mg via ORAL
  Filled 2020-05-24 (×2): qty 2

## 2020-05-24 MED ORDER — IOHEXOL 300 MG/ML  SOLN: 75.0000 mL | Freq: Once | INTRAMUSCULAR | Status: DC | PRN

## 2020-05-24 MED ORDER — HYDROCODONE-ACETAMINOPHEN 5-325 MG PO TABS
1.0000 | ORAL_TABLET | Freq: Four times a day (QID) | ORAL | Status: DC | PRN
  Administered 2020-05-24 – 2020-05-25 (×2): 1 via ORAL
  Filled 2020-05-24 (×2): qty 1

## 2020-05-24 NOTE — Progress Notes (Signed)
PROGRESS NOTE    Norma Smith  SHF:[id number] DOB: 08-29-1933 DOA: 05/20/2020 PCP: Hoyt Koch, MD   Brief Narrative: Norma Smith is a 87 y.o. female with medical history significant of iron deficiency anemia, aortic stenosis, hypertrophic cardiomyopathy, anxiety, CKD stage IV, diabetes mellitus, hyperlipidemia, hypertension, macular degeneration, hypothyroidism. Patient presented secondary to shortness of breath and found to have evidence of new heart failure. IV lasix started.   Assessment & Plan:   Active Problems:   Diabetes ([place])   Hyperlipidemia   Hypertension   Hypertrophic obstructive cardiomyopathy (HCC)   Depression   Anxiety   Monoclonal B-cell lymphocytosis of unknown significance   Asymptomatic microscopic hematuria   CKD (chronic kidney disease) stage 4, GFR 15-29 ml/min (HCC)   Acquired hypothyroidism   Macular degeneration of both eyes   Acute CHF ([place])   Diastolic heart dysfunction. Patient with a history of hypertrophic cardiomyopathy/aortic stenosis. BNP of 1151.9. Anasarca. Initial concern for atrial fibrillation which may instead be PACs per cardiology. Currently not in RVR. Initial concern for acute heart failure which is ruled out secondary to diagnosis of glomerulonephritis/nephrotic syndrome. Cardiology signed off. -No ACEi/ARB secondary to advanced CKD. -Continue Toprol XL -Daily weights/strict in and out  Arrhythmia Initial concern for atrial fibrillation. Cardiology assessment is PACs rather than atrial fibrillation. CHA2DS2-VASc Scoreis 6.  Plan drip started in the ED.  Patient is on Toprol-XL and aspirin as an outpatient. Symptoms of palpitation seem to correlate with recent increase of Synthroid from 25 mcg to 50 mcg. Transthoracic Echocardiogram significant for left atrial enlargement. Cardiology signed off.  -Continue Toprol XL -Cardiology recommending event monitor as an outpatient. This will depend on goals of care  Pleural  effusion Moderate left side consistent with fluid overload picture from acute CHF. Left lateral decubitus x-ray suggests free flowing fluid. Since low concern for CHF, will need to consider other etiologies. Mild leukocytosis but patient does have a dry cough and some pain. Afebrile. Pleural fluid appears to be consistent with transudate process.  Hypertrophic cardiomyopathy Aortic stenosis Patient was previously following with cardiology as an outpatient. Moderate stenosis on most recent Transthoracic Echocardiogram.  Chest pain At sight of thoracentesis. No respiratory distress. Discomfort is significant. -Chest x-ray today  Hypertensive urgency Improved with home medications. Orthostatic vitals negative -Continue metoprolol -Cardiology recommendations: discontinue amlodipine, start hydralazine  AKI on CKD stage IV Baseline creatine is about 2.11 from 04/22/20. Previously, baseline of 1.0-1.1 from 2020. Likely acute injury from heart failure. Slightly up today. Per nephrology, patient with glomerulonephritis with nephrotic syndrome. Poor prognosis. Recommendation for palliative care.  Microscopic hematuria Chronic issue. Per family, unsure if this was ever worked up. Hematuria has been present for years. Nephrology workup as above  Anasarca In setting of heart failure vs renal disease. Complicated by low albumin as well. Management above.  Diabetes mellitus, type II Last hemoglobin A1C of 6.3%. -SSI while inpatient  Hypothyroidism Patient previously on Synthroid 25 mcg which was increased to Synthroid 50 mcg on 04/25/2020 for TSH of 11.5. TSH on admission of 5.745 -Continue Synthroid 37.5 mcg daily  Anxiety -Continue Valium  Monoclonal B-cell lymphocytosis of unknown significance Patient previously was followed by oncology.  Currently not on any management.  History of GI bleeding At that time in 2018 but no definitive etiology found.  Patient with a history of  diverticulosis, hemorrhoids, gastritis.  Iron deficiency anemia Continue iron supplementation   DVT prophylaxis: Heparin subq Code Status:   Code Status: DNR Family Communication: Daughter  at bedside Disposition Plan: Discharge pending goals of care. Patient is from [place] and PT recommending SNF. TOC is consulted for placement.   Consultants:   Cardiology  Nephrology  Procedures:   TRANSTHORACIC ECHOCARDIOGRAM (05/21/2020) IMPRESSIONS    1. Left ventricular ejection fraction, by estimation, is 60 to 65%. The  left ventricle has normal function. The left ventricle has no regional  wall motion abnormalities. There is moderate left ventricular hypertrophy.  Left ventricular diastolic  parameters are consistent with Grade II diastolic dysfunction  (pseudonormalization).  2. Right ventricular systolic function is normal. The right ventricular  size is normal.  3. Left atrial size was severely dilated.  4. The trivial pericardial effusion is posterior to the left ventricle.  Moderate pleural effusion in the left lateral region.  5. The mitral valve is abnormal. Trivial mitral valve regurgitation.  Moderate to severe mitral annular calcification.  6. The aortic valve is trileaflet, however, the non-coronary cusp is  calcified and restricted. Aortic valve regurgitation is trivial. Moderate  aortic valve stenosis. Aortic valve area, by VTI measures 0.76 cm. Aortic  valve mean gradient measures 28.0  mmHg. Aortic valve Vmax measures 3.81 m/s.   Comparison(s): Prior images unable to be directly viewed, comparison made  by report only. Changes from prior study are noted. 08/02/2016: LVEF  65-70%, grade 2 DD, mild AS -13 mmHg mean gradient.   Conclusion(s)/Recommendation(s): Consider further aortic valve evaluation  with TEE.   Antimicrobials:  None    Subjective: Pain from thoracentesis site. Headache.  Objective: Vitals:   05/23/20 1440 05/23/20 2003 05/24/20 0405  05/24/20 0500  BP: (!) 129/53 (!) 137/52 (!) 152/75   Pulse:  63 83   Resp:  (!) 21    Temp:  97.8 F (36.6 C) 98 F (36.7 C)   TempSrc:  Oral Oral   SpO2:  98% 96%   Weight:    74.6 kg  Height:        Intake/Output Summary (Last 24 hours) at 05/24/2020 1247 Last data filed at 05/24/2020 0517 Gross per 24 hour  Intake 742.28 ml  Output 450 ml  Net 292.28 ml   Filed Weights   05/21/20 1352 05/22/20 0500 05/24/20 0500  Weight: 68.6 kg 69.2 kg 74.6 kg    Examination:  General exam: Appears calm and comfortable Respiratory system: Diminished with rales. Respiratory effort normal. Cardiovascular system: S1 & S2 heard, RRR. 3/6 systolic murmur Central nervous system: Asleep but easily arouses. Musculoskeletal: Edema   Data Reviewed: I have personally reviewed following labs and imaging studies  CBC Lab Results  Component Value Date   WBC 13.2 (H) 05/23/2020   RBC 3.24 (L) 05/23/2020   HGB 9.9 (L) 05/23/2020   HCT 32.7 (L) 05/23/2020   MCV 100.9 (H) 05/23/2020   MCH 30.6 05/23/2020   PLT 292 05/23/2020   MCHC 30.3 05/23/2020   RDW 13.6 05/23/2020   LYMPHSABS 2.9 05/20/2020   MONOABS 1.0 05/20/2020   EOSABS 0.1 05/20/2020   BASOSABS 0.1 85/02/7739     Last metabolic panel Lab Results  Component Value Date   NA 143 05/24/2020   K 5.2 (H) 05/24/2020   CL 109 05/24/2020   CO2 24 05/24/2020   BUN 49 (H) 05/24/2020   CREATININE 2.85 (H) 05/24/2020   GLUCOSE 131 (H) 05/24/2020   GFRNONAA 16 (L) 05/24/2020   GFRAA 52 (L) 08/04/2016   CALCIUM 7.9 (L) 05/24/2020   PHOS 4.2 05/23/2020   PROT 5.0 (L) 05/23/2020   ALBUMIN  1.8 (L) 05/23/2020   BILITOT 0.4 04/22/2020   ALKPHOS 64 04/22/2020   AST 22 04/22/2020   ALT 14 04/22/2020   ANIONGAP 10 05/24/2020    CBG (last 3)  Recent Labs    05/23/20 2052 05/24/20 0738 05/24/20 1210  GLUCAP 190* 115* 123*     GFR: Estimated Creatinine Clearance: 13.6 mL/min (A) (by C-G formula based on SCr of 2.85 mg/dL  (H)).  Coagulation Profile: Recent Labs  Lab 05/20/20 1400  INR 0.9    Recent Results (from the past 240 hour(s))  SARS CORONAVIRUS 2 (TAT 6-24 HRS) Nasopharyngeal Nasopharyngeal Swab     Status: None   Collection Time: 05/20/20  3:40 PM   Specimen: Nasopharyngeal Swab  Result Value Ref Range Status   SARS Coronavirus 2 NEGATIVE NEGATIVE Final    Comment: (NOTE) SARS-CoV-2 target nucleic acids are NOT DETECTED.  The SARS-CoV-2 RNA is generally detectable in upper and lower respiratory specimens during the acute phase of infection. Negative results do not preclude SARS-CoV-2 infection, do not rule out co-infections with other pathogens, and should not be used as the sole basis for treatment or other patient management decisions. Negative results must be combined with clinical observations, patient history, and epidemiological information. The expected result is Negative.  Fact Sheet for Patients: SugarRoll.be  Fact Sheet for Healthcare Providers: https://www.woods-mathews.com/  This test is not yet approved or cleared by the [place] FDA and  has been authorized for detection and/or diagnosis of SARS-CoV-2 by FDA under an Emergency Use Authorization (EUA). This EUA will remain  in effect (meaning this test can be used) for the duration of the COVID-19 declaration under Se ction 564(b)(1) of the Act, 21 U.S.C. section 360bbb-3(b)(1), unless the authorization is terminated or revoked sooner.  Performed at [facility], [place] [address] [place], [place] [address]   Body fluid culture w Gram Stain     Status: None (Preliminary result)   Collection Time: 05/23/20  2:41 PM   Specimen: PATH Cytology Pleural fluid  Result Value Ref Range Status   Specimen Description   Final    PLEURAL Performed at [facility] [address], [place], [place] [address]    Special Requests   Final    NONE Performed at  [facility], [place] [address], [place], [place] [address]    Gram Stain   Final    RARE WBC PRESENT, PREDOMINANTLY PMN NO ORGANISMS SEEN    Culture   Final    NO GROWTH < 24 HOURS Performed at [facility] [address], [place],  [address]    Report Status PENDING  Incomplete        Radiology Studies: DG Chest 1 View  Result Date: 05/23/2020 CLINICAL DATA:  Status post left thoracentesis. EXAM: CHEST  1 VIEW COMPARISON:  One-view chest x-ray 05/22/2020 FINDINGS: The heart is enlarged. Atherosclerotic calcifications are present at the aortic arch. Significant decrease in the left pleural effusion is noted. No pneumothorax is present. Small right pleural effusion is present. IMPRESSION: 1. Significant decrease in left pleural effusion without evidence for complication. 2. Small right pleural effusion. Electronically Signed   By: San Morelle M.D.   On: 05/23/2020 15:22   DG Chest Left Decubitus  Result Date: 05/22/2020 CLINICAL DATA:  Left pleural effusion, short of breath EXAM: CHEST - LEFT DECUBITUS COMPARISON:  05/20/2020 FINDINGS: Left lateral decubitus radiograph of the chest was performed. Evaluation is limited by overlying structures  obscuring the left upper lung zone. There is a free-flowing left pleural effusion volume estimated far less than 1 L. there is significant consolidation of the left lower lobe compatible with pneumonia. Right chest is clear. IMPRESSION: 1. Free-flowing component of the left pleural effusion volume estimated far less than 1 L. 2. Dense left basilar consolidation compatible with pneumonia. Electronically Signed   By: Randa Ngo M.D.   On: 05/22/2020 15:36   US THORACENTESIS ASP PLEURAL SPACE W/IMG GUIDE  Result Date: 05/23/2020 INDICATION: Patient with hospital admission for heart failure and shortness of breath secondary to pleural effusions. Interventional radiology asked to perform a therapeutic and diagnostic  thoracentesis. EXAM: ULTRASOUND GUIDED THORACENTESIS MEDICATIONS: 1% lidocaine 10 mL COMPLICATIONS: None immediate. PROCEDURE: An ultrasound guided thoracentesis was thoroughly discussed with the patient and questions answered. The benefits, risks, alternatives and complications were also discussed. The patient understands and wishes to proceed with the procedure. Written consent was obtained. Ultrasound was performed to localize and mark an adequate pocket of fluid in the left chest. The area was then prepped and draped in the normal sterile fashion. 1% Lidocaine was used for local anesthesia. Under ultrasound guidance a 6 Fr Safe-T-Centesis catheter was introduced. Thoracentesis was performed. The catheter was removed and a dressing applied. FINDINGS: A total of approximately 800 mL of clear yellow fluid was removed. Samples were sent to the laboratory as requested by the clinical team. IMPRESSION: Successful ultrasound guided left thoracentesis yielding 800 mL of pleural fluid. Read by: Soyla Dryer, NP Electronically Signed   By: Corrie Mckusick D.O.   On: 05/23/2020 15:56        Scheduled Meds: . aspirin EC  81 mg Oral Daily  . Chlorhexidine Gluconate Cloth  6 each Topical Daily  . diazepam  2.5 mg Oral Daily  . ferrous sulfate  325 mg Oral BID  . heparin  5,000 Units Subcutaneous Q8H  . hydrALAZINE  25 mg Oral Q8H  . hydrocerin   Topical BID  . insulin aspart  0-9 Units Subcutaneous TID WC  . levothyroxine  37.5 mcg Oral Q0600  . metoprolol succinate  25 mg Oral Daily  . sodium chloride flush  3 mL Intravenous Q12H   Continuous Infusions: . sodium chloride    . azithromycin 500 mg (05/23/20 1840)  . cefTRIAXone (ROCEPHIN)  IV 1 g (05/23/20 1811)     LOS: 4 days     Cordelia Poche, MD [facility] 05/24/2020, 12:47 PM  If 7PM-7AM, please contact night-coverage [email or web address]

## 2020-05-24 NOTE — Plan of Care (Signed)
  Problem: Education: °Goal: Knowledge of General Education information will improve °Description: Including pain rating scale, medication(s)/side effects and non-pharmacologic comfort measures °Outcome: Progressing °  °Problem: Pain Managment: °Goal: General experience of comfort will improve °Outcome: Progressing °  °Problem: Safety: °Goal: Ability to remain free from injury will improve °Outcome: Progressing °  °Problem: Activity: °Goal: Risk for activity intolerance will decrease °Outcome: Progressing °  °

## 2020-05-24 NOTE — Progress Notes (Signed)
Per Tele, Pt had 14 runs of VTACH. Pt assessed, denies any chest pain, asymptomatic. Norma Norrie MD, made aware with orders carried out. Will continue to monitor pt

## 2020-05-24 NOTE — Progress Notes (Signed)
Received message that patient has a change in mental status and daughter is concerned at bedside. Patient is repeating phrases and answering questions inappropriately. On evaluation, patient reports feeling queasy. When asked orientation questions, she frequently reports the idea of her being queasy. She eventually answers the question of where she is. After initially answering incorrectly, she answers the correct month, but not the year; rather she continues to repeat Tuesday and Wednesday.   BP (!) 162/60 (BP Location: Right Arm)   Pulse 82   Temp 97.7 F (36.5 C) (Oral)   Resp 17   Ht 5\' 1"  (1.549 m)   Wt 74.6 kg   SpO2 95%   BMI 31.08 kg/m   General exam: Appears calm and comfortable  Respiratory system: Respiratory effort normal. Central nervous system: Alert and oriented to person and place. CN intact with no focal deficits noted. 5/5 strength of upper/lower extremities.  A/P:  Altered mental status Unsure of etiology but most likely metabolic and related to worsening kidney disease. Per daughter, no history of seizure, but patient states she did have one a long time prior. No focal deficits to highly suggest acute CVA. Patient received dilaudid which may also be contributing to presentation as she has an intolerance to oxycodone listing visual hallucinations. -stat CT head; if CT head is negative, will obtain MRI brain and consider EEG -CMP, ammonia -Avoid dilaudid IV for now   Norma Poche, MD [facility] 05/24/2020, 3:49 PM

## 2020-05-24 NOTE — Progress Notes (Signed)
[facility] Progress Note  Subjective: alert, nasal O2  Vitals:   05/23/20 1440 05/23/20 2003 05/24/20 0405 05/24/20 0500  BP: (!) 129/53 (!) 137/52 (!) 152/75   Pulse:  63 83   Resp:  (!) 21    Temp:  97.8 F (36.6 C) 98 F (36.7 C)   TempSrc:  Oral Oral   SpO2:  98% 96%   Weight:    74.6 kg  Height:        Exam:   Gen: elderly woman sitting in chair, no distress Neck: supple no JVD noted at 30 degrees CV: irreg irreg, reg rate, III/VI SEM Abd:  Soft, nontender Lungs: dec BS to mid L lung with a few exp wheezes, normal WOB, no rales Extr:  2+ pitting LE edema and bilat 1+ UE edema Neuro: grossly nonfocal Skin: no rashes     Renal US 5/1 - 8.3/ 8.5 cm kidneys w/ mildly ^'d echo, no hydro   UA 4/29 - clear, >300 prot, rare bact, >50 rbc, 0-5 wbc   UP/C ratio = 10.9    4/29 - dense LLL consolidation, ^vasc congestion    5/1 - left decub > IMPRESSION: Free-flowing component of the left pleural effusion volume estimated far less than 1 L. Dense left basilar consolidation compatible with pneumonia   5/2 - IMPRESSION: 1. Significant decrease in left pleural effusion without evidence for complication. 2. Small right pleural effusion.    Urine sediment (5/3, undersigned) > numerous granular, mixed rbc/ granular casts, numerous dysmorphic RBC's > 50 hpf, 10-20 wbc / hpf     Hep B, C negative     GBM Ab neg      ASO wnl       C3, C4 normal to high        Assessment/ Plan: 1. AKI, Nonoliguric:  Last known Cr 1.0 in 2020 most recent creat was 2.1 in early 04/2020 now trending up after diuresis to 2.9 today.   Imaging w/o obstruction but consistent with CKD (smaller sized kidneys, 8-8.5 cm). UA with microhematuria and protein > 300.  UP/C ratio very high at 10 gm estimated proteinuria per day.  Urine sediment showing pattern c/w active GN. Patient most likely has GN w/ nephrotic-range proteinuria c/b severe hypoalbuminemia / diffuse edema. Pt doesn't want, and is a poor  candidate for, aggressive/ invasive treatments such as dialysis or immunosuppression. Have d/w dtr and patient. Prognosis is poor and this will most likely progress to esrd. eGFR was 21 ml/min on April 1st and here is down to 15- 17 ml/min. No gross uremic signs at this time. Have d/w primary MD, will consult palliative care for assistance.  2. HTN:  On toprol and hydralazine (norvasc dc'd given edema).  Holding  home ARB. Cont to avoid acei/ ARB/ nsaids.  3. HFpEF:  Grade 2 diastolic dysfunction. Diffuse edema felt to be due to nephrotic syndrome.  4. Pleural effusion:  sp L thoracentesis 5. Anemia:  H/o iron deficiency, on po iron.  Hb in the 10s.  Recheck iron indices.   6. h/o CLL/MBUS:  Formerly followed with oncology.  An outpt f/u should be considered.   Rob Mykael Trott 05/24/2020, 10:29 AM   Recent Labs  Lab 05/22/20 0434 05/23/20 0421 05/23/20 2105 05/24/20 0347  K 4.5 4.7 5.1 5.2*  BUN 45* 48*  --  49*  CREATININE 2.92* 2.98*  --  2.85*  CALCIUM 7.9* 8.0*  --  7.9*  PHOS  --  4.2  --   --  HGB 10.5* 9.9*  --   --    Inpatient medications: . aspirin EC  81 mg Oral Daily  . Chlorhexidine Gluconate Cloth  6 each Topical Daily  . diazepam  2.5 mg Oral Daily  . ferrous sulfate  325 mg Oral BID  . heparin  5,000 Units Subcutaneous Q8H  . hydrALAZINE  25 mg Oral Q8H  . hydrocerin   Topical BID  . insulin aspart  0-9 Units Subcutaneous TID WC  . levothyroxine  37.5 mcg Oral Q0600  . metoprolol succinate  25 mg Oral Daily  . sodium chloride flush  3 mL Intravenous Q12H   . sodium chloride    . azithromycin 500 mg (05/23/20 1840)  . cefTRIAXone (ROCEPHIN)  IV 1 g (05/23/20 1811)   sodium chloride, acetaminophen, hydrALAZINE, HYDROmorphone (DILAUDID) injection, ondansetron (ZOFRAN) IV, sodium chloride flush

## 2020-05-24 NOTE — Care Management Important Message (Signed)
Important Message  Patient Details IM Letter given to the Patient. Name: Norma Smith MRN: [id number] Date of Birth: 02-May-1933   Medicare Important Message Given:  Yes     [name], Norma Smith 05/24/2020, 9:20 AM

## 2020-05-25 DIAGNOSIS — I4891 Unspecified atrial fibrillation: Secondary | ICD-10-CM | POA: Diagnosis not present

## 2020-05-25 LAB — BASIC METABOLIC PANEL
Anion gap: 11 (ref 5–15)
BUN: 50 mg/dL — ABNORMAL HIGH (ref 8–23)
CO2: 23 mmol/L (ref 22–32)
Calcium: 8.1 mg/dL — ABNORMAL LOW (ref 8.9–10.3)
Chloride: 110 mmol/L (ref 98–111)
Creatinine, Ser: 2.94 mg/dL — ABNORMAL HIGH (ref 0.44–1.00)
GFR, Estimated: 15 mL/min — ABNORMAL LOW (ref >60)
Glucose, Bld: 127 mg/dL — ABNORMAL HIGH (ref 70–99)
Potassium: 4.6 mmol/L (ref 3.5–5.1)
Sodium: 144 mmol/L (ref 135–145)

## 2020-05-25 LAB — GLUCOSE, CAPILLARY
Glucose-Capillary: 102 mg/dL — ABNORMAL HIGH (ref 70–99)
Glucose-Capillary: 97 mg/dL (ref 70–99)

## 2020-05-25 LAB — PROTEIN ELECTROPHORESIS, SERUM
A/G Ratio: 0.7 (ref 0.7–1.7)
Albumin ELP: 2 g/dL — ABNORMAL LOW (ref 2.9–4.4)
Alpha-1-Globulin: 0.4 g/dL (ref 0.0–0.4)
Alpha-2-Globulin: 1 g/dL (ref 0.4–1.0)
Beta Globulin: 0.7 g/dL (ref 0.7–1.3)
Gamma Globulin: 0.6 g/dL (ref 0.4–1.8)
Globulin, Total: 2.7 g/dL (ref 2.2–3.9)
Total Protein ELP: 4.7 g/dL — ABNORMAL LOW (ref 6.0–8.5)

## 2020-05-25 LAB — CYTOLOGY - NON PAP

## 2020-05-25 MED ORDER — ONDANSETRON HCL 4 MG/2ML IJ SOLN: 4.0000 mg | Freq: Four times a day (QID) | INTRAMUSCULAR | Status: DC | PRN

## 2020-05-25 MED ORDER — LORAZEPAM 2 MG/ML IJ SOLN
1.0000 mg | INTRAMUSCULAR | Status: DC | PRN
  Administered 2020-05-26: 1 mg via INTRAVENOUS
  Filled 2020-05-25: qty 1

## 2020-05-25 MED ORDER — GLYCOPYRROLATE 0.2 MG/ML IJ SOLN
0.2000 mg | INTRAMUSCULAR | Status: DC | PRN
Start: 2020-05-25 — End: 2020-05-27

## 2020-05-25 MED ORDER — GLYCOPYRROLATE 0.2 MG/ML IJ SOLN: 0.2000 mg | INTRAMUSCULAR | Status: DC | PRN

## 2020-05-25 MED ORDER — ACETAMINOPHEN 650 MG RE SUPP: 650.0000 mg | Freq: Four times a day (QID) | RECTAL | Status: DC | PRN

## 2020-05-25 MED ORDER — LORAZEPAM 1 MG PO TABS: 1.0000 mg | ORAL_TABLET | ORAL | Status: DC | PRN

## 2020-05-25 MED ORDER — HALOPERIDOL LACTATE 5 MG/ML IJ SOLN: 0.5000 mg | INTRAMUSCULAR | Status: DC | PRN

## 2020-05-25 MED ORDER — HYDROMORPHONE HCL 1 MG/ML IJ SOLN
0.5000 mg | INTRAMUSCULAR | Status: DC | PRN
  Administered 2020-05-25 – 2020-05-27 (×7): 0.5 mg via INTRAVENOUS
  Filled 2020-05-25 (×7): qty 0.5

## 2020-05-25 MED ORDER — HYDROCODONE-ACETAMINOPHEN 5-325 MG PO TABS: 1.0000 | ORAL_TABLET | Freq: Four times a day (QID) | ORAL | Status: DC | PRN

## 2020-05-25 MED ORDER — HALOPERIDOL 0.5 MG PO TABS
0.5000 mg | ORAL_TABLET | ORAL | Status: DC | PRN
  Filled 2020-05-25: qty 1

## 2020-05-25 MED ORDER — LORAZEPAM 2 MG/ML PO CONC
1.0000 mg | ORAL | Status: DC | PRN
  Filled 2020-05-25: qty 0.5

## 2020-05-25 MED ORDER — MAGIC MOUTHWASH
15.0000 mL | Freq: Four times a day (QID) | ORAL | Status: DC | PRN
  Filled 2020-05-25: qty 15

## 2020-05-25 MED ORDER — HALOPERIDOL LACTATE 2 MG/ML PO CONC
0.5000 mg | ORAL | Status: DC | PRN
  Filled 2020-05-25: qty 0.3

## 2020-05-25 MED ORDER — GLYCOPYRROLATE 1 MG PO TABS
1.0000 mg | ORAL_TABLET | ORAL | Status: DC | PRN
  Filled 2020-05-25: qty 1

## 2020-05-25 MED ORDER — ACETAMINOPHEN 325 MG PO TABS: 650.0000 mg | ORAL_TABLET | Freq: Four times a day (QID) | ORAL | Status: DC | PRN

## 2020-05-25 MED ORDER — ONDANSETRON 4 MG PO TBDP
4.0000 mg | ORAL_TABLET | Freq: Four times a day (QID) | ORAL | Status: DC | PRN
Start: 2020-05-25 — End: 2020-05-27

## 2020-05-25 NOTE — Progress Notes (Signed)
PT Cancellation Note  Patient Details Name: Norma Smith MRN: [id number] DOB: May 11, 1933   Cancelled Treatment:    Reason Eval/Treat Not Completed: Other (comment) Pt made comfort care.  PT to sign off at this time.   [name],[name] 05/25/2020, 3:13 PM Jannette Spanner PT, DPT Acute Rehabilitation Services Pager: [phone] Office: [phone]

## 2020-05-25 NOTE — Progress Notes (Signed)
[facility] Progress Note  Subjective: no new c/o's  Vitals:   05/24/20 1506 05/24/20 1936 05/25/20 0320 05/25/20 0500  BP: (!) 162/60 (!) 148/79 (!) 156/62   Pulse: 82 91 84   Resp: 17 (!) 21 15   Temp: 97.7 F (36.5 C) 98.2 F (36.8 C) 97.6 F (36.4 C)   TempSrc: Oral Oral Oral   SpO2: 95% 92% 94%   Weight:    67.2 kg  Height:        Exam:   Gen: elderly woman sitting comfortably in bed  Neck: supple no JVD noted at 30 degrees CV: irreg irreg, reg rate, III/VI SEM Abd:  Soft, nontender Lungs: dec BS to mid L lung with a few exp wheezes, normal WOB, no rales GU: no foley Extr:  2+ pitting LE edema and bilat 1+ UE edema Neuro: grossly nonfocal Skin: no rashes     Renal US 5/1 - 8.3/ 8.5 cm kidneys w/ mildly ^'d echo, no hydro   UA 4/29 - clear, >300 prot, rare bact, >50 rbc, 0-5 wbc   UP/C ratio = 10.9    4/29 - dense LLL consolidation, ^vasc congestion    5/1 - left decub > IMPRESSION: Free-flowing component of the left pleural effusion volume estimated far less than 1 L. Dense left basilar consolidation compatible with pneumonia   5/2 - IMPRESSION: 1. Significant decrease in left pleural effusion without evidence for complication. 2. Small right pleural effusion.    BP 170/ 68  HR 78  RR 15-22  Temp 97.5   Assessment/ Plan: 1. AKI, Nonoliguric:  Last known Cr 1.0 in 2020 most recent creat was 2.1 in early 04/2020 now trending up after diuresis to 2.9 today.   Imaging w/o obstruction but consistent with CKD (smaller sized kidneys, 8-8.5 cm).  UA with microhematuria and protein.  UP/C ratio very high at 10 gm proteinuria. Serologies unremarkable so far. Urine sediment showing pattern c/w an active glomerulonephritis. She is stage IV at this time w/ sig edema from nephrotic proteinuria.  Pt is DNR. Pt doesn't want, and is a poor candidate for, aggressive/ invasive treatments such as dialysis or immunosuppression. I d/w patient and her daughter yesterday.  Palliative consult is pending. High risk of progression to esrd and then would require hospice type care.  2. HTN:  On toprol and hydralazine.  Holding ARB given AKI. 3. HFpEF:  Grade 2 diastolic dysfunction noted.  Appreciate cardiology assistance.  Diuretics caused creat ^^ so they were dc'd.  4. Pleural effusion:  Agree with further eval.   5. Anemia:  H/o iron deficiency, on po iron.  Hb in the 10s.  Recheck iron indices.  Check SPEP, SFLC. 6. h/o CLL/MBUS:  Formerly followed with oncology   Rob Kylani Wires 05/25/2020, 11:09 AM   Recent Labs  Lab 05/22/20 0434 05/23/20 0421 05/23/20 2105 05/24/20 1644 05/25/20 0333  K 4.5 4.7   < > 4.7 4.6  BUN 45* 48*   < > 49* 50*  CREATININE 2.92* 2.98*   < > 2.83* 2.94*  CALCIUM 7.9* 8.0*   < > 8.2* 8.1*  PHOS  --  4.2  --   --   --   HGB 10.5* 9.9*  --   --   --    < > = values in this interval not displayed.   Inpatient medications: . aspirin EC  81 mg Oral Daily  . azithromycin  500 mg Oral Daily  . Chlorhexidine Gluconate Cloth  6 each Topical Daily  . ferrous sulfate  325 mg Oral BID  . heparin  5,000 Units Subcutaneous Q8H  . hydrALAZINE  25 mg Oral Q8H  . hydrocerin   Topical BID  . insulin aspart  0-9 Units Subcutaneous TID WC  . levothyroxine  37.5 mcg Oral Q0600  . metoprolol succinate  25 mg Oral Daily  . sodium chloride flush  3 mL Intravenous Q12H   . sodium chloride    . cefTRIAXone (ROCEPHIN)  IV 1 g (05/24/20 1814)   sodium chloride, acetaminophen, hydrALAZINE, HYDROcodone-acetaminophen, iohexol, ondansetron (ZOFRAN) IV, sodium chloride flush

## 2020-05-25 NOTE — Progress Notes (Signed)
[facility] Progress Note  Patient: Norma Smith    JKK:[id number]  DOA: 05/20/2020     Date of Service: the patient was seen and examined on 05/25/2020  Brief hospital course: Past medical history of HOCM, CKD 4, type II DM, HLD, HTN, aortic stenosis, IDA, hypothyroidism.  Presents with complaints of shortness of breath and found to have worsening heart failure in the setting of glomerulonephritis with nephrotic syndrome. Nephrology consulted, recommend palliative care. Currently plan is comfort care.  Assessment and Plan: 1.  Acute kidney injury on CKD 4 Baseline renal function serum creatinine around 2.1. On presentation 2.9 with elevated BUN. Imaging shows no obstruction. UA consistent with proteinuria. Serologies show positive ANA, dsDNA concerning for lupus Presented with significant edema. Not a good candidate for any aggressive therapy including hemodialysis. Nephrology recommended palliative care consultation. Based on my discussion with family, patient currently on comfort care protocol.  Eventual plan is for residential hospice.  2.  Acute on chronic diastolic CHF HOCM Moderate aortic stenosis Elevated troponin HTN Cardiology was consulted.  Appreciate consultation.  Not a candidate for invasive therapy.  Troponin elevation not consistent with ACS likely demand ischemia in the setting of heart failure. Continuing current blood pressure medication. Currently comfort care  3.  HLD Currently comfort care  4.  New onset A. fib-ruled out appears PAC with aberration No further work-up.  Currently comfort care.  5.  Left pleural effusion SP thoracentesis. Continue to monitor.  Likely will recur in the setting of heart failure and nephrotic syndrome.  6.  History of CLL/MBUS Follows up with oncology. Currently on comfort care protocol.  Hematuria Anasarca Type 2 diabetes mellitus, controlled. Hypothyroidism. Anxiety. History of GI bleed Iron deficiency  anemia. Currently comfort care.  Body mass index is 27.99 kg/m.   Diet: regular diet DVT Prophylaxis: comfort care      Advance goals of care discussion: DNR  Family Communication: Goals of care conversation.  Family was present at bedside, at the time of interview.  Discussed extensively regarding patient's current condition and prognosis. Not a candidate for hemodialysis or aggressive therapy. Appears to have progressively worsening uremia and volume overload. Mentation worsening resulting in poor p.o. intake as well. In last 24 hours patient has had nothing p.o.  At present recommended family to consider comfort as goal of therapy which they agree with. Patient will most likely benefit from residential hospice given her limited prognosis although we will continue to observe over next 24 hours.  Opportunity was given to ask question and all questions were answered satisfactorily.   Disposition:  Status is: Inpatient  Remains inpatient appropriate because:Ongoing active pain requiring inpatient pain management   Dispo: The patient is from: Home              Anticipated d/c is to: TO BE DETERMINED              Patient currently is not medically stable to d/c.   Difficult to place patient No  Subjective: Sleepy most of the day.  No acute complaint.  No nausea no vomiting. Appears Short of breath.  Physical Exam:  General: Appear in moderate distress, no Rash; Oral Mucosa Clear, dry. no Abnormal Neck Mass Or lumps, Conjunctiva normal  Cardiovascular: S1 and S2 Present, no Murmur, Respiratory: increased respiratory effort, Bilateral Air entry present and bilateral  Crackles, no wheezes Abdomen: Bowel Sound present, Soft and no tenderness Extremities: bilateral  Pedal edema Neurology: lethargic and not oriented to time, place,  and person affect flat in affect. no new focal deficit Gait not checked due to patient safety concerns  Vitals:   05/24/20 1506 05/24/20 1936  05/25/20 0320 05/25/20 0500  BP: (!) 162/60 (!) 148/79 (!) 156/62   Pulse: 82 91 84   Resp: 17 (!) 21 15   Temp: 97.7 F (36.5 C) 98.2 F (36.8 C) 97.6 F (36.4 C)   TempSrc: Oral Oral Oral   SpO2: 95% 92% 94%   Weight:    67.2 kg  Height:        Intake/Output Summary (Last 24 hours) at 05/25/2020 2018 Last data filed at 05/25/2020 1400 Gross per 24 hour  Intake 470.45 ml  Output 475 ml  Net -4.55 ml   Filed Weights   05/22/20 0500 05/24/20 0500 05/25/20 0500  Weight: 69.2 kg 74.6 kg 67.2 kg    Data Reviewed: I have personally reviewed and interpreted daily labs, tele strips, imaging. I reviewed all nursing notes, pharmacy notes, vitals, pertinent old records I have discussed plan of care as described above with RN and patient/family.  CBC: Recent Labs  Lab 05/20/20 1255 05/21/20 0008 05/22/20 0434 05/23/20 0421  WBC 14.1* 12.2* 13.5* 13.2*  NEUTROABS 10.0*  --   --   --   HGB 11.0* 10.4* 10.5* 9.9*  HCT 35.2* 33.7* 34.8* 32.7*  MCV 97.8 99.1 101.5* 100.9*  PLT 305 280 279 299   Basic Metabolic Panel: Recent Labs  Lab 05/20/20 1400 05/21/20 0008 05/22/20 0434 05/23/20 0421 05/23/20 2105 05/24/20 0347 05/24/20 1644 05/25/20 0333  NA  --    < > 143 144  --  143 147* 144  K  --    < > 4.5 4.7 5.1 5.2* 4.7 4.6  CL  --    < > 108 111  --  109 112* 110  CO2  --    < > 25 25  --  24 24 23   GLUCOSE  --    < > 158* 147*  --  131* 110* 127*  BUN  --    < > 45* 48*  --  49* 49* 50*  CREATININE  --    < > 2.92* 2.98*  --  2.85* 2.83* 2.94*  CALCIUM  --    < > 7.9* 8.0*  --  7.9* 8.2* 8.1*  MG 1.7  --  1.8  --  1.7  --   --   --   PHOS  --   --   --  4.2  --   --   --   --    < > = values in this interval not displayed.    Studies: No results found.  Scheduled Meds: . aspirin EC  81 mg Oral Daily  . Chlorhexidine Gluconate Cloth  6 each Topical Daily  . hydrocerin   Topical BID  . levothyroxine  37.5 mcg Oral Q0600  . metoprolol succinate  25 mg Oral Daily   . sodium chloride flush  3 mL Intravenous Q12H   Continuous Infusions: . sodium chloride     PRN Meds: sodium chloride, acetaminophen **OR** acetaminophen, glycopyrrolate **OR** glycopyrrolate **OR** glycopyrrolate, haloperidol **OR** haloperidol **OR** haloperidol lactate, HYDROcodone-acetaminophen, HYDROmorphone (DILAUDID) injection, iohexol, LORazepam **OR** LORazepam **OR** LORazepam, LORazepam, magic mouthwash, ondansetron **OR** ondansetron (ZOFRAN) IV, sodium chloride flush  Time spent: 35 minutes  Author: Berle Mull, MD [facility] 05/25/2020 8:18 PM  To reach On-call, see care teams to locate the attending and reach out via [email or web address].  Between 7PM-7AM, please contact night-coverage If you still have difficulty reaching the attending provider, please page the [facility] (Director on Call) for [facility] on amion for assistance.

## 2020-05-25 NOTE — Progress Notes (Signed)
OT Cancellation Note  Patient Details Name: Norma Smith MRN: [id number] DOB: Jun 17, 1933   Cancelled Treatment:    Reason Eval/Treat Not Completed: Other (comment). Patient made comfort care. OT order cancelled.  [place] Korri Ask 05/25/2020, 2:38 PM

## 2020-05-26 DIAGNOSIS — I4891 Unspecified atrial fibrillation: Secondary | ICD-10-CM | POA: Diagnosis not present

## 2020-05-26 LAB — BODY FLUID CULTURE W GRAM STAIN: Culture: NO GROWTH

## 2020-05-26 MED ORDER — OXYCODONE HCL 5 MG PO TABS
2.5000 mg | ORAL_TABLET | Freq: Three times a day (TID) | ORAL | Status: DC
Start: 2020-05-26 — End: 2020-05-27
  Administered 2020-05-26: 2.5 mg via ORAL
  Filled 2020-05-26: qty 1

## 2020-05-26 NOTE — TOC Progression Note (Signed)
Transition of Care [facility]) - Progression Note    Patient Details  Name: Norma Smith MRN: [id number] Date of Birth: 1933/04/08  Transition of Care [facility]) CM/SW Contact  Purcell Mouton, RN Phone Number: 05/26/2020, 1:39 PM  Clinical Narrative:    Spoke with pt's daughter [name] concerning residential Hospice. [facility] ws selected, referral given to [facility] in house rep.    Expected Discharge Plan: [place] Barriers to Discharge: Continued Medical Work up  Expected Discharge Plan and Services Expected Discharge Plan: [place] In-house Referral: Clinical Social Work   Post Acute Care Choice: [place] Living arrangements for the past 2 months: [place]                 DME Arranged: N/A DME Agency: NA                   Social Determinants of Health (SDOH) Interventions    Readmission Risk Interventions No flowsheet data found.

## 2020-05-26 NOTE — Plan of Care (Signed)
  Problem: Pain Managment: Goal: General experience of comfort will improve Outcome: Progressing   Problem: Safety: Goal: Ability to remain free from injury will improve Outcome: Progressing   

## 2020-05-26 NOTE — Progress Notes (Signed)
[facility] Progress Note  Subjective: no new c/o's, creat stable 3.9  Vitals:   05/24/20 1936 05/25/20 0320 05/25/20 0500 05/26/20 0643  BP: (!) 148/79 (!) 156/62  (!) 142/58  Pulse: 91 84  81  Resp: (!) 21 15  16   Temp: 98.2 F (36.8 C) 97.6 F (36.4 C)  98.5 F (36.9 C)  TempSrc: Oral Oral  Oral  SpO2: 92% 94%  92%  Weight:   67.2 kg   Height:        Exam:   Gen: elderly woman sitting comfortably in bed . Sleepy today Neck: supple no JVD noted at 30 degrees CV: irreg irreg, reg rate, III/VI SEM Abd:  Soft, nontender Lungs: dec BS to mid L lung with a few exp wheezes, normal WOB, no rales GU: no foley Extr:  2+ pitting LE edema and bilat 1+ UE edema Neuro: grossly nonfocal Skin: no rashes     Renal US 5/1 - 8.3/ 8.5 cm kidneys w/ mildly ^'d echo, no hydro   UA 4/29 - clear, >300 prot, rare bact, >50 rbc, 0-5 wbc   UP/C ratio = 10.9    4/29 - dense LLL consolidation, ^vasc congestion    5/1 - left decub > IMPRESSION: Free-flowing component of the left pleural effusion volume estimated far less than 1 L. Dense left basilar consolidation compatible with pneumonia   5/2 - IMPRESSION: 1. Significant decrease in left pleural effusion without evidence for complication. 2. Small right pleural effusion.    BP 170/ 68  HR 78  RR 15-22  Temp 97.5   Assessment/ Plan: 1. AKI, Nonoliguric:  Last known Cr 1.0 in 2020 most recent creat was 2.1 in early April, now trending up after diuresis to 2.9 range. Lasix was stopped.   Imaging shows no obstruction but consistent with CKD (smaller sized kidneys, 8-8.5 cm).  UA showed microhematuria and protein.  UP/C was ratio very high at 10 gm proteinuria. Serologies unremarkable. Urine sediment shows pattern c/w active glomerulonephritis. Clinically she has progressive stage IV renal failure due to some type of GN, w/ associated nephrotic syndrome/ edema/ low alb. Pt is DNR and poor candidate for immunosuppression or dialysis. Have  d/w pt and family. Palliative consult is pending. High risk of progression to esrd which would require hospice-type care.  2. HTN:  On toprol and hydralazine.  Holding ARB given AKI. 3. HFpEF:  Grade 2 diastolic dysfunction noted.  Appreciate cardiology assistance.  Diuretics caused creat ^^ so they were dc'd. 4. Anemia:  H/o iron deficiency, on po iron.  Hb in the 10s.  Recheck iron indices.  Check SPEP, SFLC. 5. h/o CLL/MBUS:  Formerly followed with oncology   Rob Fatim Vanderschaaf 05/26/2020, 11:08 AM   Recent Labs  Lab 05/22/20 0434 05/23/20 0421 05/23/20 2105 05/24/20 1644 05/25/20 0333  K 4.5 4.7   < > 4.7 4.6  BUN 45* 48*   < > 49* 50*  CREATININE 2.92* 2.98*   < > 2.83* 2.94*  CALCIUM 7.9* 8.0*   < > 8.2* 8.1*  PHOS  --  4.2  --   --   --   HGB 10.5* 9.9*  --   --   --    < > = values in this interval not displayed.   Inpatient medications: . aspirin EC  81 mg Oral Daily  . Chlorhexidine Gluconate Cloth  6 each Topical Daily  . hydrocerin   Topical BID  . levothyroxine  37.5 mcg Oral Q0600  .  metoprolol succinate  25 mg Oral Daily  . sodium chloride flush  3 mL Intravenous Q12H   . sodium chloride     sodium chloride, acetaminophen **OR** acetaminophen, glycopyrrolate **OR** glycopyrrolate **OR** glycopyrrolate, haloperidol **OR** haloperidol **OR** haloperidol lactate, HYDROcodone-acetaminophen, HYDROmorphone (DILAUDID) injection, iohexol, LORazepam **OR** LORazepam **OR** LORazepam, LORazepam, magic mouthwash, ondansetron **OR** ondansetron (ZOFRAN) IV, sodium chloride flush

## 2020-05-26 NOTE — Progress Notes (Signed)
[facility] Progress Note  Patient: Norma Smith    NKN:[id number]  DOA: 05/20/2020     Date of Service: the patient was seen and examined on 05/26/2020  Brief hospital course: Past medical history of HOCM, CKD 4, type II DM, HLD, HTN, aortic stenosis, IDA, hypothyroidism.  Presents with complaints of shortness of breath and found to have worsening heart failure in the setting of glomerulonephritis with nephrotic syndrome. Nephrology consulted, recommend palliative care. Currently plan is comfort care.  Prognosis less than 2 to 3 weeks given minimal p.o. intake in last 48 hours.  Assessment and Plan: 1.  Acute kidney injury on CKD 4 Baseline renal function serum creatinine around 2.1. On presentation 2.9 with elevated BUN. Imaging shows no obstruction. UA consistent with proteinuria. Serologies show positive ANA, dsDNA concerning for lupus Presented with significant edema. Not a good candidate for any aggressive therapy including hemodialysis. Nephrology recommended palliative care consultation. Based on my discussion with family, patient currently on comfort care protocol.  Eventual plan is for residential hospice.  2.  Acute on chronic diastolic CHF HOCM Moderate aortic stenosis Elevated troponin HTN Cardiology was consulted.  Appreciate consultation.  Not a candidate for invasive therapy.  Troponin elevation not consistent with ACS likely demand ischemia in the setting of heart failure. Continuing current blood pressure medication. Currently comfort care Appears to have a shortness of breath.  Will place on scheduled oxycodone.  3.  HLD Currently comfort care  4.  New onset A. fib-ruled out appears PAC with aberration No further work-up.  Currently comfort care.  5.  Left pleural effusion SP thoracentesis. Continue to monitor.  Likely will recur in the setting of heart failure and nephrotic syndrome.  6.  History of CLL/MBUS Follows up with oncology. Currently on  comfort care protocol.  Hematuria Anasarca Type 2 diabetes mellitus, controlled. Hypothyroidism. Anxiety. History of GI bleed Iron deficiency anemia. Currently comfort care.  Body mass index is 27.99 kg/m.   Diet: regular diet DVT Prophylaxis: comfort care   Advance goals of care discussion: DNR  Family Communication: Goals of care conversation.  Family was present at bedside, at the time of interview.  Discussed extensively regarding patient's current condition and prognosis. Not a candidate for hemodialysis or aggressive therapy. Appears to have progressively worsening uremia and volume overload. Mentation worsening resulting in poor p.o. intake as well. Continues to have poor p.o. intake in 48 hours.  Also has shortness of breath now requiring scheduled medication. At present recommended family to consider comfort as goal of therapy which they agree with. Patient will most likely benefit from residential hospice given her limited prognosis although we will continue to observe over next 24 hours.  Opportunity was given to ask question and all questions were answered satisfactorily.   Disposition:  Status is: Inpatient  Remains inpatient appropriate because:Ongoing active pain requiring inpatient pain management   Dispo: The patient is from: Home              Anticipated d/c is to: Residential hospice              Patient currently is not medically stable to d/c.   Difficult to place patient No  Subjective: Appears short of breath.  No nausea no vomiting.  Has some cough.  Somewhat more confused today.  No nausea no vomiting.  Physical Exam:  General: Appear in mild distress, no Rash; Oral Mucosa Clear, moist. no Abnormal Neck Mass Or lumps, Conjunctiva normal  Cardiovascular: S1 and  S2 Present, no Murmur, Respiratory: increased respiratory effort, Bilateral Air entry present and bilateral  Crackles, no wheezes Abdomen: Bowel Sound present, Extremities: trace Pedal  edema Neurology: Fatigue and oriented to person affect appropriate. no new focal deficit Gait not checked due to patient safety concerns  Vitals:   05/25/20 0320 05/25/20 0500 05/26/20 0643 05/26/20 1331  BP: (!) 156/62  (!) 142/58 (!) 156/48  Pulse: 84  81 74  Resp: 15  16 14   Temp: 97.6 F (36.4 C)  98.5 F (36.9 C) 98.4 F (36.9 C)  TempSrc: Oral  Oral Oral  SpO2: 94%  92% 95%  Weight:  67.2 kg    Height:        Intake/Output Summary (Last 24 hours) at 05/26/2020 1930 Last data filed at 05/26/2020 0600 Gross per 24 hour  Intake 0 ml  Output 79 ml  Net -79 ml   Filed Weights   05/22/20 0500 05/24/20 0500 05/25/20 0500  Weight: 69.2 kg 74.6 kg 67.2 kg    Data Reviewed: I have personally reviewed and interpreted daily labs, tele strips, imaging. I reviewed all nursing notes, pharmacy notes, vitals, pertinent old records I have discussed plan of care as described above with RN and patient/family.  CBC: Recent Labs  Lab 05/20/20 1255 05/21/20 0008 05/22/20 0434 05/23/20 0421  WBC 14.1* 12.2* 13.5* 13.2*  NEUTROABS 10.0*  --   --   --   HGB 11.0* 10.4* 10.5* 9.9*  HCT 35.2* 33.7* 34.8* 32.7*  MCV 97.8 99.1 101.5* 100.9*  PLT 305 280 279 935   Basic Metabolic Panel: Recent Labs  Lab 05/20/20 1400 05/21/20 0008 05/22/20 0434 05/23/20 0421 05/23/20 2105 05/24/20 0347 05/24/20 1644 05/25/20 0333  NA  --    < > 143 144  --  143 147* 144  K  --    < > 4.5 4.7 5.1 5.2* 4.7 4.6  CL  --    < > 108 111  --  109 112* 110  CO2  --    < > 25 25  --  24 24 23   GLUCOSE  --    < > 158* 147*  --  131* 110* 127*  BUN  --    < > 45* 48*  --  49* 49* 50*  CREATININE  --    < > 2.92* 2.98*  --  2.85* 2.83* 2.94*  CALCIUM  --    < > 7.9* 8.0*  --  7.9* 8.2* 8.1*  MG 1.7  --  1.8  --  1.7  --   --   --   PHOS  --   --   --  4.2  --   --   --   --    < > = values in this interval not displayed.    Studies: No results found.  Scheduled Meds: . aspirin EC  81 mg Oral  Daily  . Chlorhexidine Gluconate Cloth  6 each Topical Daily  . hydrocerin   Topical BID  . levothyroxine  37.5 mcg Oral Q0600  . metoprolol succinate  25 mg Oral Daily  . oxyCODONE  2.5 mg Oral TID  . sodium chloride flush  3 mL Intravenous Q12H   Continuous Infusions: . sodium chloride     PRN Meds: sodium chloride, acetaminophen **OR** acetaminophen, glycopyrrolate **OR** glycopyrrolate **OR** glycopyrrolate, haloperidol **OR** haloperidol **OR** haloperidol lactate, HYDROcodone-acetaminophen, HYDROmorphone (DILAUDID) injection, iohexol, LORazepam **OR** LORazepam **OR** LORazepam, LORazepam, magic mouthwash, ondansetron **OR** ondansetron (ZOFRAN)  IV, sodium chloride flush  Time spent: 35 minutes  Author: Berle Mull, MD [facility] 05/26/2020 7:30 PM  To reach On-call, see care teams to locate the attending and reach out via [email or web address]. Between 7PM-7AM, please contact night-coverage If you still have difficulty reaching the attending provider, please page the [facility] (Director on Call) for [facility] on amion for assistance.

## 2020-05-26 NOTE — Progress Notes (Signed)
Pt resting comfortably. Held Oxycodone, administered Oxy IR with Dilaudid earlier in shift. Pt open eyes to verbal stimuli and turn and reposition. Settle down after reposition. SRP, RN

## 2020-05-26 NOTE — Progress Notes (Signed)
Noted pt c/o generalized pain. Pt exhibits odd affect with inappropriate laughter as she describes her pain. Noted pt is looking around rapidly as if she is looking for some one or thing. Pt denies she os looking for anything as she is chuckling. Pt medicated for comfort at this time

## 2020-05-26 NOTE — Plan of Care (Signed)
  Problem: Education: Goal: Knowledge of General Education information will improve Description Including pain rating scale, medication(s)/side effects and non-pharmacologic comfort measures Outcome: Progressing   

## 2020-05-26 NOTE — Progress Notes (Signed)
Pr restless and moaning in bed. Pain med given will cont to assess. SRP, RN

## 2020-05-27 DIAGNOSIS — I4891 Unspecified atrial fibrillation: Secondary | ICD-10-CM | POA: Diagnosis not present

## 2020-05-27 NOTE — Progress Notes (Signed)
   Referral received from Transitions of care team Cookie. I have reached out to contact daughter [name] but have been unsuccessful in reaching her. I have left messages for her to return my call so we can discuss transition from Hospital to the Hospice home in [facility].   Webb Silversmith RN [phone].

## 2020-05-27 NOTE — Progress Notes (Signed)
Report called to [facility], [place]. EMS arrive to transport pt to facility. Roactor sent with pt. SRP, RN

## 2020-05-27 NOTE — Care Management Important Message (Signed)
Important Message  Patient Details IM Letter given to the Patient. Name: Norma Smith MRN: [id number] Date of Birth: Jul 09, 1933   Medicare Important Message Given:  Yes     [name], [name] 05/27/2020, 11:39 AM

## 2020-05-27 NOTE — TOC Transition Note (Signed)
Transition of Care [facility]) - CM/SW Discharge Note   Patient Details  Name: Gordon Carlson MRN: [id number] Date of Birth: 10/28/1933  [facility] CM/SW Contact:  Ross Ludwig, LCSW Phone Number: 05/27/2020, 1:59 PM   Clinical Narrative:     CSW spoke to [place] and they have a bed available for patient to discharge today.  Patient to be d/c'ed today to [facility].  Patient and family agreeable to plans will transport via ems RN to call report to [phone].  CSW notified patient's daughter Mel Almond who is aware that patient is discharging today.  Final next level of care: [place] Barriers to Discharge: Barriers Resolved   Patient Goals and CMS Choice Patient states their goals for this hospitalization and ongoing recovery are:: To go to hospice facilty for end of life care. CMS Medicare.gov Compare Post Acute Care list provided to:: Patient Represenative (must comment) Choice offered to / list presented to : Adult Children  Discharge Placement  [facility].              Patient to be transferred to facility by: [facility] Name of family member notified: Patient's daughter Mel Almond [phone] Patient and family notified of of transfer: 05/27/20  Discharge Plan and Services In-house Referral: Clinical Social Work   Post Acute Care Choice: [place]          DME Arranged: N/A DME Agency: NA                  Social Determinants of Health (SDOH) Interventions     Readmission Risk Interventions No flowsheet data found.

## 2020-05-27 NOTE — Progress Notes (Signed)
[facility] discharged to [facility], personal belonging packed and sent with pt as follow: Cell phone, charger, glasses, glass case, dentures, card and flower. Attempted to notify family will call again. SRP, RN

## 2020-05-27 NOTE — Discharge Summary (Signed)
[facility] Discharge Summary   Patient: Norma Smith LTJ:[id number]  PCP: Hoyt Koch, MD  Date of admission: 05/20/2020   Date of discharge:  05/27/2020     Discharge Diagnoses:  Principal diagnosis Nephritis with nephrotic syndrome  Active Problems:   Diabetes ([place])   Hyperlipidemia   Hypertension   Hypertrophic obstructive cardiomyopathy (HCC)   Depression   Anxiety   Monoclonal B-cell lymphocytosis of unknown significance   Asymptomatic microscopic hematuria   CKD (chronic kidney disease) stage 4, GFR 15-29 ml/min (HCC)   Acquired hypothyroidism   Macular degeneration of both eyes   Acute CHF ([place])   Admitted From: Home Disposition: Residential hospice  Recommendations for Outpatient Follow-up:  1. PCP: Establish care with residential hospice   Follow-up Information    Hoyt Koch, MD .   Specialty: Internal Medicine Contact information: [place] [address] [phone]              Discharge Instructions    Amb referral to [facility]   Complete by: As directed    Diet general   Complete by: As directed    Increase activity slowly   Complete by: As directed      Diet recommendation: Comfort feed  Activity: The patient is advised to gradually reintroduce usual activities, as tolerated  Discharge Condition: stable  Code Status: DNR   History of present illness: As per the H and P dictated on admission, "Norma Smith is a 87 y.o. female with medical history significant of iron deficiency anemia, aortic stenosis, hypertrophic cardiomyopathy, anxiety, CKD stage IV, diabetes mellitus, hyperlipidemia, hypertension, macular degeneration, hypothyroidism. Patient presents with worsening dyspnea over the past few weeks. She notices her dyspnea mostly when lying down flat and trying to turn. She reports taking her lasix but does not take it every day as directed. She has associated dyspnea on exertion. She reports a  fall while getting out of her shower yesterday without loss of consciousness.  ED Course: Vitals: Temperature 98.2 F, pulse 72 bpm, respirations 20/min, blood pressure 201/79, SPO2 97% on room air Labs: BUN of 41, creatinine 2.66, WBC 14.1K, hemoglobin 11.0, BNP 1151 urinalysis significant for protein greater than 300, 50 glucose 50, RBC greater than 50, hyaline casts Imaging: CT head significant for no acute findings and chronic small vessel ischemic changes Medications/Course: Lasix 40 mg IV, heparin IV infusion, Toprol tartrate 25 mg p.o. x1"  Hospital Course:  Summary of her active problems in the hospital is as following.   1.  Acute kidney injury on CKD 4 Glomerulonephritis with nephrotic syndrome Uremia with acute metabolic encephalopathy Baseline renal function serum creatinine around 2.1. On presentation 2.9 with elevated BUN. Imaging shows no obstruction. UA consistent with proteinuria. Serologies show positive ANA, dsDNA concerning for lupus like picture. Presented with significant edema. Not a good candidate for any aggressive therapy including hemodialysis.  Patient also does not want any aggressive therapy. Nephrology recommended palliative care consultation. Based on my discussion with family, patient currently on comfort care protocol.  Eventual plan is for residential hospice.  2.  Acute on chronic diastolic CHF HOCM Moderate aortic stenosis Elevated troponin HTN Cardiology was consulted.  Appreciate consultation.  Not a candidate for invasive therapy.  Troponin elevation not consistent with ACS likely demand ischemia in the setting of heart failure. Continuing current blood pressure medication. Currently comfort care Appears to have a shortness of breath.   on scheduled oxycodone.  3.  HLD Currently comfort care  4.  New onset A. fib-ruled out appears PAC with aberration No further work-up.  Currently comfort care.  5.  Left pleural effusion SP  thoracentesis. Continue to monitor.  Likely will recur in the setting of heart failure and nephrotic syndrome.  6.  History of CLL/MBUS Follows up with oncology. Currently on comfort care protocol.  Hematuria Anasarca Type 2 diabetes mellitus, controlled with renal complication without any long-term insulin use. Hypothyroidism. Anxiety. History of GI bleed Iron deficiency anemia. Currently comfort care.  On the day of the discharge the patient's vitals were stable, and no other acute medical condition were reported by patient. The patient was felt safe to be discharge at the residential hospice  Consultants: Nephrology, cardiology Procedures: Echocardiogram   DISCHARGE MEDICATION: Allergies as of 05/27/2020      Reactions   Codeine Other (See Comments)   Passed out   Oxycodone Other (See Comments)   Visual Hallucinations   Roxanol [morphine] Hives, Rash   Penicillins Rash   Has patient had a PCN reaction causing immediate rash, facial/tongue/throat swelling, SOB or lightheadedness with hypotension: Yes Has patient had a PCN reaction causing severe rash involving mucus membranes or skin necrosis: Yes Has patient had a PCN reaction that required hospitalization: No Has patient had a PCN reaction occurring within the last 10 years: No If all of the above answers are "NO", then may proceed with Cephalosporin use.      Medication List    STOP taking these medications   amLODipine 5 MG tablet Commonly known as: NORVASC   aspirin 81 MG tablet   atorvastatin 20 MG tablet Commonly known as: LIPITOR   CENTRUM SILVER PO   D3 PO   diazepam 5 MG tablet Commonly known as: VALIUM   furosemide 40 MG tablet Commonly known as: LASIX   glucose blood test strip   Iron 325 (65 Fe) MG Tabs   Omega 3-6-9 Caps   potassium chloride 10 MEQ tablet Commonly known as: KLOR-CON   PRESERVISION AREDS 2 PO   VITAMIN D PO     TAKE these medications   levothyroxine 50 MCG  tablet Commonly known as: SYNTHROID Take 1 tablet (50 mcg total) by mouth daily.   metoprolol succinate 25 MG 24 hr tablet Commonly known as: TOPROL-XL TAKE 1 tab by mouth once daily What changed:   how much to take  how to take this  when to take this  additional instructions       Discharge Exam: Filed Weights   05/22/20 0500 05/24/20 0500 05/25/20 0500  Weight: 69.2 kg 74.6 kg 67.2 kg   Vitals:   05/26/20 1331 05/26/20 2300  BP: (!) 156/48 (!) 126/50  Pulse: 74 78  Resp: 14   Temp: 98.4 F (36.9 C) 98.2 F (36.8 C)  SpO2: 95% 94%   General: Appear in mild distress, no Rash; Oral Mucosa Clear, moist. no Abnormal Neck Mass Or lumps, Conjunctiva normal  Cardiovascular: S1 and S2 Present, aortic systolic Murmur, Respiratory: good respiratory effort, Bilateral Air entry present and faint basal crackles, no wheezes Abdomen: Bowel Sound present Extremities: Bilateral pedal edema Neurology: Lethargic, oriented to self.  No focal deficit.  The results of significant diagnostics from this hospitalization (including imaging, microbiology, ancillary and laboratory) are listed below for reference.    Significant Diagnostic Studies: DG Chest 1 View  Result Date: 05/23/2020 CLINICAL DATA:  Status post left thoracentesis. EXAM: CHEST  1 VIEW COMPARISON:  One-view chest x-ray 05/22/2020 FINDINGS: The heart is  enlarged. Atherosclerotic calcifications are present at the aortic arch. Significant decrease in the left pleural effusion is noted. No pneumothorax is present. Small right pleural effusion is present. IMPRESSION: 1. Significant decrease in left pleural effusion without evidence for complication. 2. Small right pleural effusion. Electronically Signed   By: San Morelle M.D.   On: 05/23/2020 15:22   CT HEAD WO CONTRAST  Result Date: 05/24/2020 CLINICAL DATA:  Altered mental status EXAM: CT HEAD WITHOUT CONTRAST TECHNIQUE: Contiguous axial images were obtained from the  base of the skull through the vertex without intravenous contrast. COMPARISON:  May 20, 2020. FINDINGS: Brain: Note mild motion artifact. There is stable mild diffuse atrophy. There is no intracranial mass, hemorrhage, extra-axial fluid collection, or midline shift. Patchy decreased attenuation in the centra semiovale bilaterally is stable. No acute appearing infarct evident. Vascular: No hyperdense vessels. Calcification noted in each distal vertebral artery and carotid siphon region. Skull: Bony calvarium appears intact. Sinuses/Orbits: Visualized paranasal sinuses are clear. Visualized orbits appear symmetric bilaterally. Other: Mastoid air cells are clear. IMPRESSION: Stable mild atrophy with patchy periventricular small vessel disease. No mass or hemorrhage evident. No acute infarct. Multiple foci of arterial vascular calcification noted. Electronically Signed   By: Lowella Grip III M.D.   On: 05/24/2020 17:26   CT Head Wo Contrast  Result Date: 05/20/2020 CLINICAL DATA:  Neuro deficit, fatigue, lower extremity swelling. EXAM: CT HEAD WITHOUT CONTRAST TECHNIQUE: Contiguous axial images were obtained from the base of the skull through the vertex without intravenous contrast. COMPARISON:  None. FINDINGS: Brain: Mild chronic small vessel ischemic changes within the deep periventricular white matter regions bilaterally. No mass, hemorrhage, edema or other evidence of acute parenchymal abnormality. No extra-axial hemorrhage. Vascular: Chronic calcified atherosclerotic changes of the large vessels at the skull base. No unexpected hyperdense vessel. Skull: Normal. Negative for fracture or focal lesion. Sinuses/Orbits: No acute finding. Other: None. IMPRESSION: 1. No acute findings. No intracranial mass, hemorrhage or edema. 2. Mild chronic small vessel ischemic changes in the white matter. Electronically Signed   By: Franki Cabot M.D.   On: 05/20/2020 16:24   DG Chest Left Decubitus  Result Date:  05/22/2020 CLINICAL DATA:  Left pleural effusion, short of breath EXAM: CHEST - LEFT DECUBITUS COMPARISON:  05/20/2020 FINDINGS: Left lateral decubitus radiograph of the chest was performed. Evaluation is limited by overlying structures obscuring the left upper lung zone. There is a free-flowing left pleural effusion volume estimated far less than 1 L. there is significant consolidation of the left lower lobe compatible with pneumonia. Right chest is clear. IMPRESSION: 1. Free-flowing component of the left pleural effusion volume estimated far less than 1 L. 2. Dense left basilar consolidation compatible with pneumonia. Electronically Signed   By: Randa Ngo M.D.   On: 05/22/2020 15:36   US RENAL  Result Date: 05/22/2020 CLINICAL DATA:  Acute renal insufficiency EXAM: RENAL / URINARY TRACT ULTRASOUND COMPLETE COMPARISON:  None. FINDINGS: Right Kidney: Renal measurements: 8.5 x 4.1 x 4.3 cm = volume: 89 mL. Contains a 1.9 cm cyst. No hydronephrosis. Mild increased cortical echogenicity. Left Kidney: Renal measurements: 8.7 x 4.0 x 3.9 cm = volume: 70 mL. Contains a 9 mm cyst. Mild increased cortical echogenicity. Bladder: Appears normal for degree of bladder distention. Other: None. IMPRESSION: 1. Mild increased cortical echogenicity suggesting medical renal disease. Small bilateral renal cysts as above. Electronically Signed   By: Dorise Bullion III M.D   On: 05/22/2020 09:40   DG CHEST PORT 1  VIEW  Result Date: 05/24/2020 CLINICAL DATA:  Weakness, leg swelling, shortness of breath EXAM: PORTABLE CHEST 1 VIEW COMPARISON:  Portable exam 1651 hours compared to 05/23/2020 FINDINGS: Enlargement of cardiac silhouette with pulmonary vascular congestion. Atherosclerotic calcification aorta. BILATERAL pleural effusions greater on LEFT with associated LEFT basilar atelectasis. Diffuse BILATERAL pulmonary infiltrates likely representing pulmonary edema. No pneumothorax. Bones demineralized. IMPRESSION: CHF with  bibasilar effusions and atelectasis greater on LEFT. Aortic Atherosclerosis (ICD10-I70.0). Electronically Signed   By: Lavonia Dana M.D.   On: 05/24/2020 17:10   DG Chest Port 1 View  Result Date: 05/20/2020 CLINICAL DATA:  Lower extremity edema, increasing fatigue EXAM: PORTABLE CHEST 1 VIEW COMPARISON:  07/30/2017 FINDINGS: Single frontal view of the chest demonstrates an enlarged cardiac silhouette, with marked calcification of the mitral annulus. There is a moderate left pleural effusion, with underlying consolidation at the left lung base. Trace right pleural fluid is noted. Increased central vascular congestion. No pneumothorax. IMPRESSION: 1. Dense consolidation at the left lung base, with moderate left pleural effusion. 2. Trace right pleural fluid. 3. Increased central vascular congestion. 4. Enlarged cardiac silhouette, with dense calcification of the mitral annulus. 5. While the constellation of findings is most consistent with fluid overload, close follow-up is warranted to evaluate for resolution of the dense consolidation at the left lung base. Electronically Signed   By: Randa Ngo M.D.   On: 05/20/2020 15:46   ECHOCARDIOGRAM COMPLETE  Result Date: 05/21/2020    ECHOCARDIOGRAM REPORT   Patient Name:   Norma Smith Date of Exam: 05/21/2020 Medical Rec #:  [id number]     Height:       61.0 in Accession #:    [id number]    Weight:       151.4 lb Date of Birth:  1933/04/28      BSA:          1.678 m Patient Age:    5 years      BP:           147/87 mmHg Patient Gender: F             HR:           74 bpm. Exam Location:  Inpatient Procedure: 2D Echo, Cardiac Doppler and Color Doppler Indications:    I50.23 Acute on chronic systolic (congestive) heart failure  History:        Patient has prior history of Echocardiogram examinations, most                 recent 08/02/2016. Cardiomyopathy, Stroke and COPD, Aortic Valve                 Disease; Risk Factors:Hypertension, Diabetes and Dyslipidemia.                  [place].  Sonographer:    Jonelle Sidle Dance Referring Phys: [place]  1. Left ventricular ejection fraction, by estimation, is 60 to 65%. The left ventricle has normal function. The left ventricle has no regional wall motion abnormalities. There is moderate left ventricular hypertrophy. Left ventricular diastolic parameters are consistent with Grade II diastolic dysfunction (pseudonormalization).  2. Right ventricular systolic function is normal. The right ventricular size is normal.  3. Left atrial size was severely dilated.  4. The trivial pericardial effusion is posterior to the left ventricle. Moderate pleural effusion in the left lateral region.  5. The mitral valve is abnormal. Trivial mitral valve regurgitation. Moderate to severe mitral annular  calcification.  6. The aortic valve is trileaflet, however, the non-coronary cusp is calcified and restricted. Aortic valve regurgitation is trivial. Moderate aortic valve stenosis. Aortic valve area, by VTI measures 0.76 cm. Aortic valve mean gradient measures 28.0 mmHg. Aortic valve Vmax measures 3.81 m/s. Comparison(s): Prior images unable to be directly viewed, comparison made by report only. Changes from prior study are noted. 08/02/2016: LVEF 65-70%, grade 2 DD, mild AS -13 mmHg mean gradient. Conclusion(s)/Recommendation(s): Consider further aortic valve evaluation with TEE. FINDINGS  Left Ventricle: Left ventricular ejection fraction, by estimation, is 60 to 65%. The left ventricle has normal function. The left ventricle has no regional wall motion abnormalities. The left ventricular internal cavity size was normal in size. There is  moderate left ventricular hypertrophy. Left ventricular diastolic parameters are consistent with Grade II diastolic dysfunction (pseudonormalization). Indeterminate filling pressures. Right Ventricle: The right ventricular size is normal. No increase in right ventricular wall thickness. Right  ventricular systolic function is normal. Left Atrium: Left atrial size was severely dilated. Right Atrium: Right atrial size was normal in size. Pericardium: Trivial pericardial effusion is present. The pericardial effusion is posterior to the left ventricle. Mitral Valve: The mitral valve is abnormal. There is moderate thickening of the mitral valve leaflet(s). Moderate to severe mitral annular calcification. Trivial mitral valve regurgitation. Tricuspid Valve: The tricuspid valve is grossly normal. Tricuspid valve regurgitation is trivial. Aortic Valve: The non-coronary cusp appears calcified and resticted. The aortic valve is tricuspid. Aortic valve regurgitation is trivial. Moderate aortic stenosis is present. Aortic valve mean gradient measures 28.0 mmHg. Aortic valve peak gradient measures 58.1 mmHg. Aortic valve area, by VTI measures 0.76 cm. Pulmonic Valve: The pulmonic valve was grossly normal. Pulmonic valve regurgitation is mild. Aorta: The aortic root and ascending aorta are structurally normal, with no evidence of dilitation. IAS/Shunts: No atrial level shunt detected by color flow Doppler. Additional Comments: There is a moderate pleural effusion in the left lateral region.  LEFT VENTRICLE PLAX 2D LVIDd:         3.70 cm LVIDs:         2.50 cm LV PW:         1.60 cm LV IVS:        1.60 cm LVOT diam:     1.90 cm LV SV:         71 LV SV Index:   42 LVOT Area:     2.84 cm  RIGHT VENTRICLE             IVC RV Basal diam:  2.30 cm     IVC diam: 2.10 cm RV S prime:     12.10 cm/s TAPSE (M-mode): 2.0 cm LEFT ATRIUM              Index       RIGHT ATRIUM          Index LA diam:        4.40 cm  2.62 cm/m  RA Area:     9.60 cm LA Vol (A2C):   121.0 ml 72.11 ml/m RA Volume:   14.00 ml 8.34 ml/m LA Vol (A4C):   102.0 ml 60.79 ml/m LA Biplane Vol: 114.0 ml 67.94 ml/m  AORTIC VALVE AV Area (Vmax):    1.12 cm AV Area (Vmean):   1.11 cm AV Area (VTI):     0.76 cm AV Vmax:           381.00 cm/s AV Vmean:  239.000 cm/s AV VTI:            0.940 m AV Peak Grad:      58.1 mmHg AV Mean Grad:      28.0 mmHg LVOT Vmax:         151.00 cm/s LVOT Vmean:        93.300 cm/s LVOT VTI:          0.251 m LVOT/AV VTI ratio: 0.27  AORTA Ao Root diam: 3.10 cm Ao Asc diam:  3.50 cm MITRAL VALVE MV Area (PHT): 2.24 cm     SHUNTS MV Decel Time: 338 msec     Systemic VTI:  0.25 m MV E velocity: 163.00 cm/s  Systemic Diam: 1.90 cm MV A velocity: 140.00 cm/s MV E/A ratio:  1.16 Lyman Bishop MD Electronically signed by Lyman Bishop MD Signature Date/Time: 05/21/2020/12:37:14 PM    Final    US THORACENTESIS ASP PLEURAL SPACE W/IMG GUIDE  Result Date: 05/23/2020 INDICATION: Patient with hospital admission for heart failure and shortness of breath secondary to pleural effusions. Interventional radiology asked to perform a therapeutic and diagnostic thoracentesis. EXAM: ULTRASOUND GUIDED THORACENTESIS MEDICATIONS: 1% lidocaine 10 mL COMPLICATIONS: None immediate. PROCEDURE: An ultrasound guided thoracentesis was thoroughly discussed with the patient and questions answered. The benefits, risks, alternatives and complications were also discussed. The patient understands and wishes to proceed with the procedure. Written consent was obtained. Ultrasound was performed to localize and mark an adequate pocket of fluid in the left chest. The area was then prepped and draped in the normal sterile fashion. 1% Lidocaine was used for local anesthesia. Under ultrasound guidance a 6 Fr Safe-T-Centesis catheter was introduced. Thoracentesis was performed. The catheter was removed and a dressing applied. FINDINGS: A total of approximately 800 mL of clear yellow fluid was removed. Samples were sent to the laboratory as requested by the clinical team. IMPRESSION: Successful ultrasound guided left thoracentesis yielding 800 mL of pleural fluid. Read by: Soyla Dryer, NP Electronically Signed   By: Corrie Mckusick D.O.   On: 05/23/2020 15:56     Microbiology: Recent Results (from the past 240 hour(s))  SARS CORONAVIRUS 2 (TAT 6-24 HRS) Nasopharyngeal Nasopharyngeal Swab     Status: None   Collection Time: 05/20/20  3:40 PM   Specimen: Nasopharyngeal Swab  Result Value Ref Range Status   SARS Coronavirus 2 NEGATIVE NEGATIVE Final    Comment: (NOTE) SARS-CoV-2 target nucleic acids are NOT DETECTED.  The SARS-CoV-2 RNA is generally detectable in upper and lower respiratory specimens during the acute phase of infection. Negative results do not preclude SARS-CoV-2 infection, do not rule out co-infections with other pathogens, and should not be used as the sole basis for treatment or other patient management decisions. Negative results must be combined with clinical observations, patient history, and epidemiological information. The expected result is Negative.  Fact Sheet for Patients: SugarRoll.be  Fact Sheet for Healthcare Providers: https://www.woods-mathews.com/  This test is not yet approved or cleared by the [place] FDA and  has been authorized for detection and/or diagnosis of SARS-CoV-2 by FDA under an Emergency Use Authorization (EUA). This EUA will remain  in effect (meaning this test can be used) for the duration of the COVID-19 declaration under Se ction 564(b)(1) of the Act, 21 U.S.C. section 360bbb-3(b)(1), unless the authorization is terminated or revoked sooner.  Performed at [facility], [place] [address] [place],  [address]   Body fluid culture w Gram Stain  Status: None   Collection Time: 05/23/20  2:41 PM   Specimen: PATH Cytology Pleural fluid  Result Value Ref Range Status   Specimen Description   Final    PLEURAL Performed at [facility] [address] [place], [place] [address]    Special Requests   Final    NONE Performed at [facility], [place] [address] [place], [place] [address]     Gram Stain   Final    RARE WBC PRESENT, PREDOMINANTLY PMN NO ORGANISMS SEEN    Culture   Final    NO GROWTH 3 DAYS Performed at [facility] [address], [place], [place] [address]    Report Status 05/26/2020 FINAL  Final     Labs: CBC: Recent Labs  Lab 05/21/20 0008 05/22/20 0434 05/23/20 0421  WBC 12.2* 13.5* 13.2*  HGB 10.4* 10.5* 9.9*  HCT 33.7* 34.8* 32.7*  MCV 99.1 101.5* 100.9*  PLT 280 279 510   Basic Metabolic Panel: Recent Labs  Lab 05/20/20 1400 05/21/20 0008 05/22/20 0434 05/23/20 0421 05/23/20 2105 05/24/20 0347 05/24/20 1644 05/25/20 0333  NA  --    < > 143 144  --  143 147* 144  K  --    < > 4.5 4.7 5.1 5.2* 4.7 4.6  CL  --    < > 108 111  --  109 112* 110  CO2  --    < > 25 25  --  24 24 23   GLUCOSE  --    < > 158* 147*  --  131* 110* 127*  BUN  --    < > 45* 48*  --  49* 49* 50*  CREATININE  --    < > 2.92* 2.98*  --  2.85* 2.83* 2.94*  CALCIUM  --    < > 7.9* 8.0*  --  7.9* 8.2* 8.1*  MG 1.7  --  1.8  --  1.7  --   --   --   PHOS  --   --   --  4.2  --   --   --   --    < > = values in this interval not displayed.   Liver Function Tests: Recent Labs  Lab 05/23/20 0421 05/23/20 1108 05/24/20 1644  AST  --   --  26  ALT  --   --  19  ALKPHOS  --   --  51  BILITOT  --   --  0.3  PROT  --  5.0* 4.9*  ALBUMIN 1.8*  --  1.8*   CBG: Recent Labs  Lab 05/24/20 1210 05/24/20 1623 05/24/20 2128 05/25/20 0828 05/25/20 1201  GLUCAP 123* 103* 119* 102* 97    Time spent: 35 minutes  Signed:  Berle Mull  [facility]  05/27/2020 1:03 PM

## 2020-05-30 ENCOUNTER — Telehealth: Payer: Self-pay | Admitting: *Deleted

## 2020-05-30 NOTE — Telephone Encounter (Signed)
Confirmed with Dr. Radford Pax,  Cardiac Event monitor ordered 05/23/2020 still needed as patient was discharged to [facility]. [place] to arrange for Cardiac Event monitor to be shipped to their facility. I was told typically once a patient enters hospice, previous orders are discontinued because the patient is now on comfort care only.   Ms. Molter is now under the care of Dr. Beverlyn Roux. I was asked to have Dr. Radford Pax contact Dr. Beverlyn Roux at [phone] to discuss necessity.  If she doesn't answer, she is probably with a patient.  Please leave a message with a contact number and she will return your call.

## 2020-06-07 ENCOUNTER — Encounter: Payer: Medicare HMO | Admitting: Physician Assistant

## 2020-06-07 NOTE — Progress Notes (Signed)
This encounter was created in error - please disregard.

## 2020-06-22 DEATH — deceased

## 2021-01-13 ENCOUNTER — Other Ambulatory Visit: Payer: Self-pay | Admitting: Internal Medicine

## 2022-10-10 IMAGING — DX DG CHEST DECUBITUS*L*
1 series · 1 of 1 positions shown · non-contrast
Comparison: 05/20/2020

CLINICAL DATA: Left pleural effusion, short of breath

EXAM:
CHEST - LEFT DECUBITUS

[chest decu]
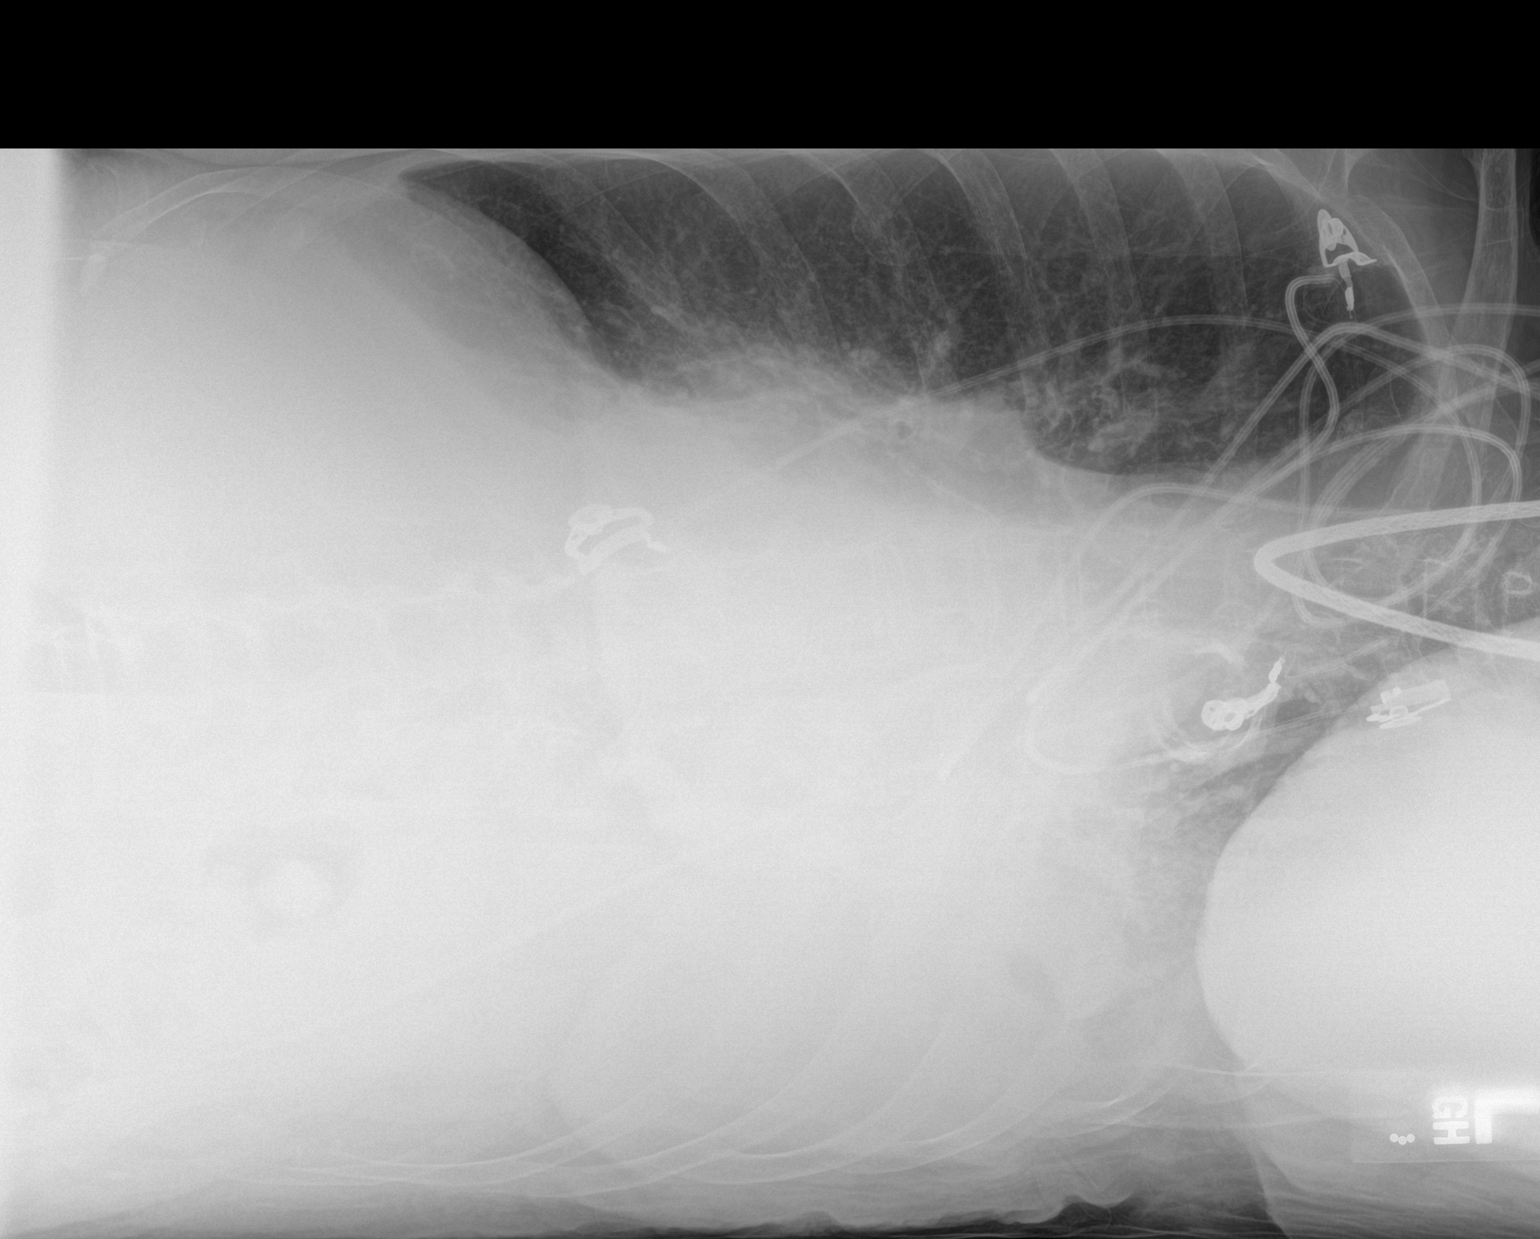

[1 of 1 positions shown; findings below may reference images not displayed]

FINDINGS: Left lateral decubitus radiograph of the chest was performed.
Evaluation is limited by overlying structures obscuring the left
upper lung zone. There is a free-flowing left pleural effusion
volume estimated far less than 1 L. there is significant
consolidation of the left lower lobe compatible with pneumonia.
Right chest is clear.
IMPRESSION: 1. Free-flowing component of the left pleural effusion volume
estimated far less than 1 L.
2. Dense left basilar consolidation compatible with pneumonia.

## 2022-10-12 IMAGING — DX DG CHEST 1V PORT
1 series · 1 of 1 positions shown · non-contrast
Comparison: Portable exam 2542 hours compared to 05/23/2020

CLINICAL DATA: Weakness, leg swelling, shortness of breath

EXAM:
PORTABLE CHEST 1 VIEW

[chest ap]
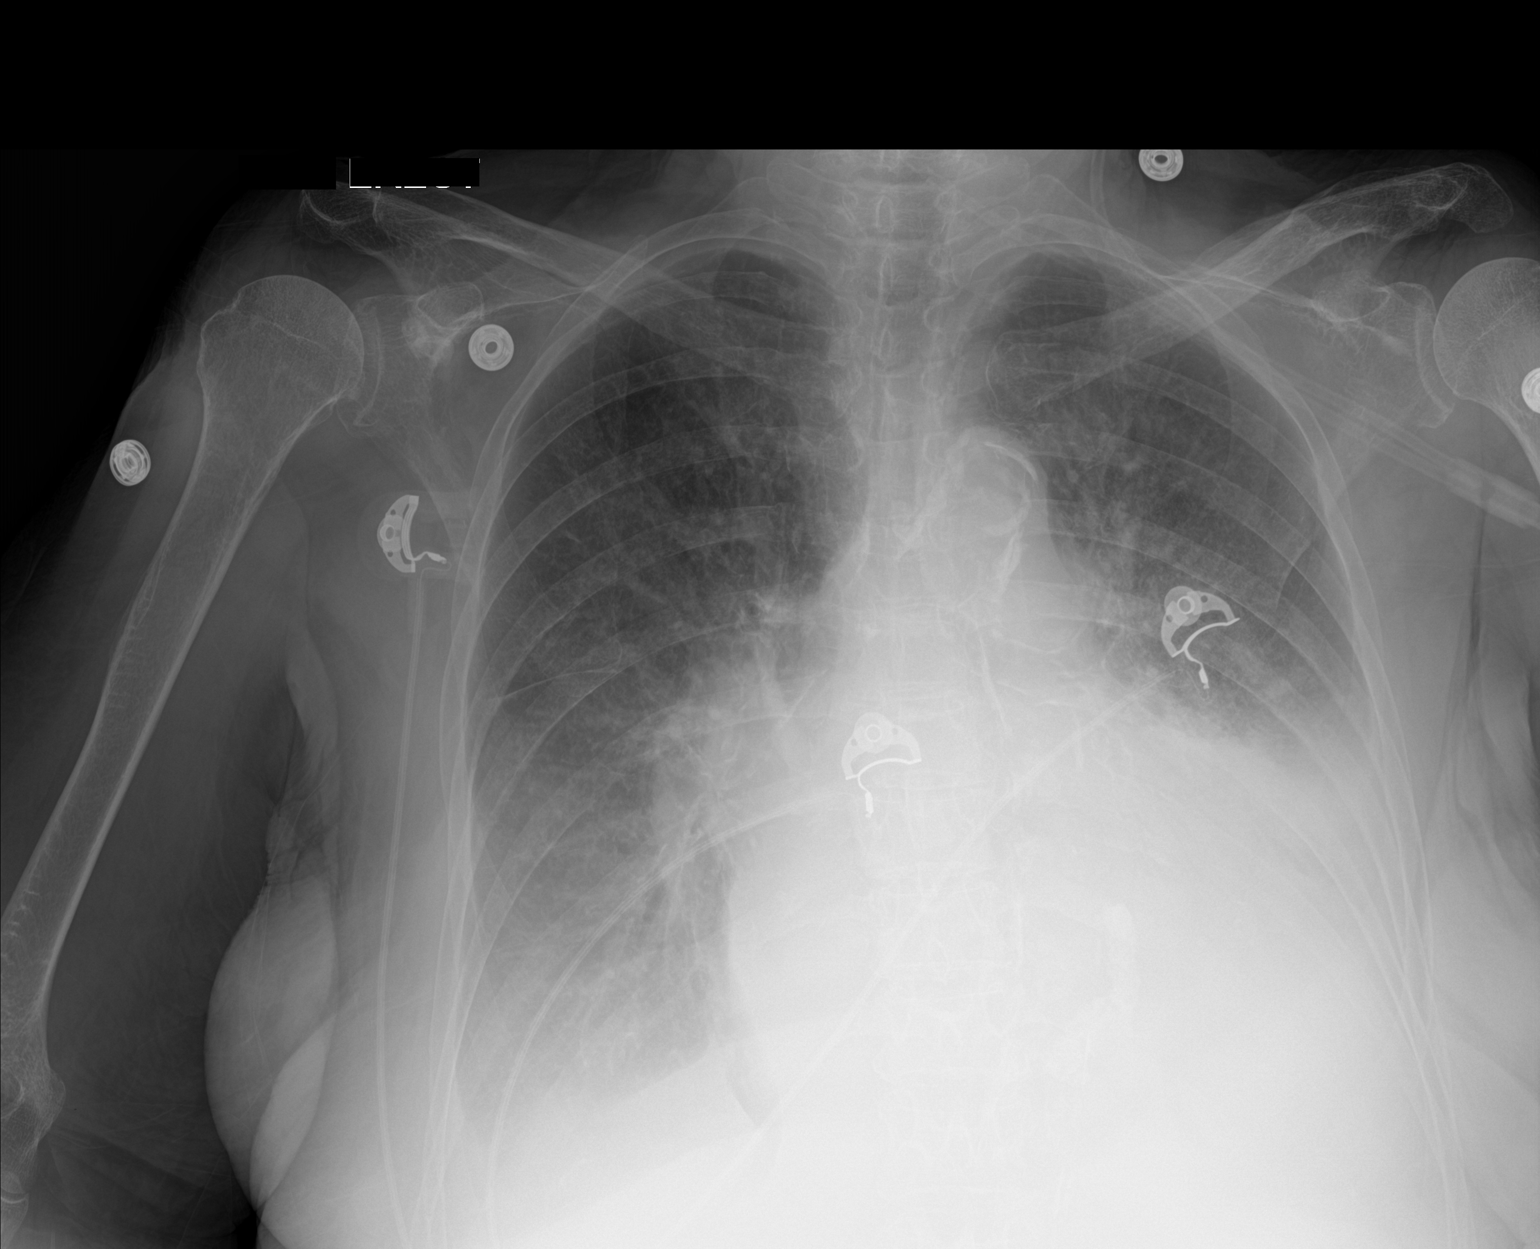

[1 of 1 positions shown; findings below may reference images not displayed]

FINDINGS: Enlargement of cardiac silhouette with pulmonary vascular
congestion.

Atherosclerotic calcification aorta.

BILATERAL pleural effusions greater on LEFT with associated LEFT
basilar atelectasis.

Diffuse BILATERAL pulmonary infiltrates likely representing
pulmonary edema.

No pneumothorax.

Bones demineralized.
IMPRESSION: CHF with bibasilar effusions and atelectasis greater on LEFT.

Aortic Atherosclerosis ([id number]).
